# Patient Record
Sex: Male | Born: 1955 | Race: White | Hispanic: No | Marital: Married | Smoking: Never smoker
Health system: Southern US, Community
[De-identification: ages and names within clinical notes are randomized; demographics above are authoritative.]

## PROBLEM LIST (undated history)

## (undated) DIAGNOSIS — G4700 Insomnia, unspecified: Secondary | ICD-10-CM

## (undated) DIAGNOSIS — R002 Palpitations: Secondary | ICD-10-CM

## (undated) DIAGNOSIS — K219 Gastro-esophageal reflux disease without esophagitis: Secondary | ICD-10-CM

## (undated) DIAGNOSIS — J309 Allergic rhinitis, unspecified: Secondary | ICD-10-CM

## (undated) DIAGNOSIS — I498 Other specified cardiac arrhythmias: Secondary | ICD-10-CM

## (undated) DIAGNOSIS — F419 Anxiety disorder, unspecified: Secondary | ICD-10-CM

## (undated) DIAGNOSIS — R079 Chest pain, unspecified: Secondary | ICD-10-CM

## (undated) DIAGNOSIS — F32A Depression, unspecified: Secondary | ICD-10-CM

## (undated) HISTORY — DX: Gastro-esophageal reflux disease without esophagitis: K21.9

## (undated) HISTORY — PX: NASAL SEPTUM SURGERY: SHX37

## (undated) HISTORY — DX: Chest pain, unspecified: R07.9

## (undated) HISTORY — PX: OTHER SURGICAL HISTORY: SHX169

## (undated) HISTORY — DX: Allergic rhinitis, unspecified: J30.9

## (undated) HISTORY — DX: Other specified cardiac arrhythmias: I49.8

## (undated) HISTORY — DX: Depression, unspecified: F32.A

## (undated) HISTORY — DX: Palpitations: R00.2

## (undated) HISTORY — PX: UPPER GASTROINTESTINAL ENDOSCOPY: SHX188

## (undated) HISTORY — DX: Anxiety disorder, unspecified: F41.9

## (undated) HISTORY — DX: Insomnia, unspecified: G47.00

---

## 2004-01-03 ENCOUNTER — Ambulatory Visit: Payer: Self-pay | Admitting: Internal Medicine

## 2004-02-29 ENCOUNTER — Ambulatory Visit: Payer: Self-pay | Admitting: Internal Medicine

## 2004-03-18 ENCOUNTER — Ambulatory Visit: Payer: Self-pay | Admitting: Internal Medicine

## 2004-03-26 ENCOUNTER — Ambulatory Visit: Payer: Self-pay | Admitting: Internal Medicine

## 2004-05-10 ENCOUNTER — Ambulatory Visit: Payer: Self-pay | Admitting: Internal Medicine

## 2004-05-17 ENCOUNTER — Ambulatory Visit: Payer: Self-pay | Admitting: Internal Medicine

## 2004-06-07 ENCOUNTER — Ambulatory Visit: Payer: Self-pay | Admitting: Internal Medicine

## 2004-09-06 ENCOUNTER — Ambulatory Visit: Payer: Self-pay | Admitting: Internal Medicine

## 2004-11-15 ENCOUNTER — Ambulatory Visit: Payer: Self-pay | Admitting: Internal Medicine

## 2004-11-22 ENCOUNTER — Ambulatory Visit: Payer: Self-pay | Admitting: Internal Medicine

## 2005-04-30 ENCOUNTER — Ambulatory Visit: Payer: Self-pay | Admitting: Family Medicine

## 2005-04-30 ENCOUNTER — Encounter: Payer: Self-pay | Admitting: Internal Medicine

## 2005-11-27 ENCOUNTER — Ambulatory Visit: Payer: Self-pay | Admitting: Internal Medicine

## 2005-12-05 ENCOUNTER — Ambulatory Visit: Payer: Self-pay | Admitting: Internal Medicine

## 2005-12-05 LAB — CONVERTED CEMR LAB
AST: 22 units/L (ref 0–37)
Albumin: 3.9 g/dL (ref 3.5–5.2)
BUN: 11 mg/dL (ref 6–23)
CO2: 29 meq/L (ref 19–32)
Calcium: 9.3 mg/dL (ref 8.4–10.5)
Chol/HDL Ratio, serum: 3.7
Cholesterol: 186 mg/dL (ref 0–200)
Creatinine, Ser: 1.1 mg/dL (ref 0.4–1.5)
Eosinophil percent: 3.5 % (ref 0.0–5.0)
HCT: 42.2 % (ref 39.0–52.0)
HDL: 49.9 mg/dL (ref >39.0)
Hemoglobin: 14.1 g/dL (ref 13.0–17.0)
Lymphocytes Relative: 40.6 % (ref 12.0–46.0)
MCHC: 33.4 g/dL (ref 30.0–36.0)
Neutro Abs: 2.5 10*3/uL (ref 1.4–7.7)
Neutrophils Relative %: 46.4 % (ref 43.0–77.0)
Potassium: 3.9 meq/L (ref 3.5–5.1)
RDW: 12.1 % (ref 11.5–14.6)
TSH: 1.59 microintl units/mL (ref 0.35–5.50)
Total Bilirubin: 1 mg/dL (ref 0.3–1.2)
Triglyceride fasting, serum: 62 mg/dL (ref 0–149)

## 2005-12-12 ENCOUNTER — Ambulatory Visit: Payer: Self-pay | Admitting: Internal Medicine

## 2006-01-16 ENCOUNTER — Ambulatory Visit: Payer: Self-pay | Admitting: Family Medicine

## 2006-05-12 ENCOUNTER — Ambulatory Visit: Payer: Self-pay | Admitting: Internal Medicine

## 2006-07-23 ENCOUNTER — Ambulatory Visit: Payer: Self-pay | Admitting: Internal Medicine

## 2006-08-17 ENCOUNTER — Ambulatory Visit (HOSPITAL_COMMUNITY): Admission: RE | Admit: 2006-08-17 | Discharge: 2006-08-17 | Payer: Self-pay | Admitting: Gastroenterology

## 2006-08-28 ENCOUNTER — Ambulatory Visit: Payer: Self-pay | Admitting: Gastroenterology

## 2006-09-25 ENCOUNTER — Ambulatory Visit: Payer: Self-pay | Admitting: Internal Medicine

## 2006-09-25 ENCOUNTER — Telehealth: Payer: Self-pay | Admitting: Internal Medicine

## 2006-09-25 DIAGNOSIS — I498 Other specified cardiac arrhythmias: Secondary | ICD-10-CM

## 2006-09-25 DIAGNOSIS — J309 Allergic rhinitis, unspecified: Secondary | ICD-10-CM

## 2006-09-25 HISTORY — DX: Allergic rhinitis, unspecified: J30.9

## 2006-09-25 HISTORY — DX: Other specified cardiac arrhythmias: I49.8

## 2006-10-27 ENCOUNTER — Ambulatory Visit: Payer: Self-pay | Admitting: Internal Medicine

## 2006-12-08 ENCOUNTER — Ambulatory Visit: Payer: Self-pay | Admitting: Internal Medicine

## 2007-02-08 ENCOUNTER — Ambulatory Visit: Payer: Self-pay | Admitting: Internal Medicine

## 2007-09-17 ENCOUNTER — Ambulatory Visit (HOSPITAL_COMMUNITY): Admission: RE | Admit: 2007-09-17 | Discharge: 2007-09-17 | Payer: Self-pay | Admitting: Chiropractic Medicine

## 2007-10-11 ENCOUNTER — Telehealth: Payer: Self-pay | Admitting: Internal Medicine

## 2007-10-11 ENCOUNTER — Ambulatory Visit: Payer: Self-pay | Admitting: Internal Medicine

## 2007-10-11 LAB — CONVERTED CEMR LAB
AST: 22 units/L (ref 0–37)
Basophils Absolute: 0 10*3/uL (ref 0.0–0.1)
Bilirubin Urine: NEGATIVE
Bilirubin, Direct: 0.1 mg/dL (ref 0.0–0.3)
Blood in Urine, dipstick: NEGATIVE
Chloride: 110 meq/L (ref 96–112)
Cholesterol: 153 mg/dL (ref 0–200)
Creatinine, Ser: 1.1 mg/dL (ref 0.4–1.5)
Eosinophils Absolute: 0.2 10*3/uL (ref 0.0–0.7)
GFR calc non Af Amer: 75 mL/min
Glucose, Urine, Semiquant: NEGATIVE
HDL: 49.1 mg/dL (ref >39.0)
MCHC: 34.3 g/dL (ref 30.0–36.0)
MCV: 91.1 fL (ref 78.0–100.0)
Neutrophils Relative %: 43.7 % (ref 43.0–77.0)
PSA: 0.71 ng/mL (ref 0.10–4.00)
Platelets: 183 10*3/uL (ref 150–400)
Potassium: 4.1 meq/L (ref 3.5–5.1)
Protein, U semiquant: NEGATIVE
RDW: 12.6 % (ref 11.5–14.6)
Sodium: 142 meq/L (ref 135–145)
Total Bilirubin: 1.4 mg/dL — ABNORMAL HIGH (ref 0.3–1.2)
Urobilinogen, UA: 0.2
VLDL: 13 mg/dL (ref 0–40)
pH: 7

## 2007-10-28 ENCOUNTER — Ambulatory Visit: Payer: Self-pay | Admitting: Internal Medicine

## 2007-10-28 DIAGNOSIS — R002 Palpitations: Secondary | ICD-10-CM

## 2007-10-28 DIAGNOSIS — R079 Chest pain, unspecified: Secondary | ICD-10-CM

## 2007-10-28 HISTORY — DX: Chest pain, unspecified: R07.9

## 2007-10-28 HISTORY — DX: Palpitations: R00.2

## 2007-11-26 ENCOUNTER — Ambulatory Visit: Payer: Self-pay

## 2007-12-01 ENCOUNTER — Encounter: Payer: Self-pay | Admitting: Internal Medicine

## 2007-12-13 ENCOUNTER — Ambulatory Visit: Payer: Self-pay | Admitting: Internal Medicine

## 2007-12-13 DIAGNOSIS — J209 Acute bronchitis, unspecified: Secondary | ICD-10-CM

## 2007-12-15 ENCOUNTER — Telehealth: Payer: Self-pay | Admitting: Internal Medicine

## 2008-01-14 ENCOUNTER — Ambulatory Visit: Payer: Self-pay | Admitting: Cardiovascular Disease

## 2008-01-14 ENCOUNTER — Encounter: Payer: Self-pay | Admitting: Cardiovascular Disease

## 2008-01-14 DIAGNOSIS — R578 Other shock: Secondary | ICD-10-CM | POA: Insufficient documentation

## 2008-06-05 ENCOUNTER — Ambulatory Visit: Payer: Self-pay | Admitting: Internal Medicine

## 2008-06-05 DIAGNOSIS — H6980 Other specified disorders of Eustachian tube, unspecified ear: Secondary | ICD-10-CM

## 2008-06-16 ENCOUNTER — Ambulatory Visit: Payer: Self-pay | Admitting: Internal Medicine

## 2008-06-16 DIAGNOSIS — H6590 Unspecified nonsuppurative otitis media, unspecified ear: Secondary | ICD-10-CM | POA: Insufficient documentation

## 2008-07-13 ENCOUNTER — Telehealth: Payer: Self-pay | Admitting: Internal Medicine

## 2008-08-09 ENCOUNTER — Ambulatory Visit: Payer: Self-pay | Admitting: Internal Medicine

## 2008-08-09 DIAGNOSIS — G4700 Insomnia, unspecified: Secondary | ICD-10-CM

## 2008-08-09 HISTORY — DX: Insomnia, unspecified: G47.00

## 2008-09-22 ENCOUNTER — Ambulatory Visit: Payer: Self-pay | Admitting: Gastroenterology

## 2008-11-03 ENCOUNTER — Telehealth: Payer: Self-pay | Admitting: Gastroenterology

## 2008-11-09 ENCOUNTER — Ambulatory Visit: Payer: Self-pay | Admitting: Gastroenterology

## 2008-11-09 HISTORY — PX: COLONOSCOPY: SHX174

## 2008-11-24 ENCOUNTER — Ambulatory Visit: Payer: Self-pay | Admitting: Internal Medicine

## 2009-01-31 ENCOUNTER — Telehealth: Payer: Self-pay | Admitting: Internal Medicine

## 2009-01-31 ENCOUNTER — Telehealth: Payer: Self-pay | Admitting: Gastroenterology

## 2009-05-22 ENCOUNTER — Ambulatory Visit: Payer: Self-pay | Admitting: Internal Medicine

## 2009-05-22 DIAGNOSIS — J0100 Acute maxillary sinusitis, unspecified: Secondary | ICD-10-CM | POA: Insufficient documentation

## 2009-11-19 ENCOUNTER — Ambulatory Visit: Payer: Self-pay | Admitting: Internal Medicine

## 2009-11-19 LAB — CONVERTED CEMR LAB
ALT: 17 units/L (ref 0–53)
AST: 24 units/L (ref 0–37)
Albumin: 4 g/dL (ref 3.5–5.2)
BUN: 18 mg/dL (ref 6–23)
Basophils Relative: 0.7 % (ref 0.0–3.0)
Blood in Urine, dipstick: NEGATIVE
Chloride: 107 meq/L (ref 96–112)
Eosinophils Relative: 4.4 % (ref 0.0–5.0)
GFR calc non Af Amer: 84.31 mL/min (ref >60)
Glucose, Urine, Semiquant: NEGATIVE
HCT: 40 % (ref 39.0–52.0)
HDL: 62.1 mg/dL (ref >39.00)
Hemoglobin: 13.7 g/dL (ref 13.0–17.0)
Lymphs Abs: 2.1 10*3/uL (ref 0.7–4.0)
MCV: 91.1 fL (ref 78.0–100.0)
Monocytes Absolute: 0.5 10*3/uL (ref 0.1–1.0)
Monocytes Relative: 10.4 % (ref 3.0–12.0)
Neutro Abs: 2.1 10*3/uL (ref 1.4–7.7)
Nitrite: NEGATIVE
PSA: 0.98 ng/mL (ref 0.10–4.00)
Potassium: 4.2 meq/L (ref 3.5–5.1)
Protein, U semiquant: NEGATIVE
RBC: 4.39 M/uL (ref 4.22–5.81)
Sodium: 141 meq/L (ref 135–145)
TSH: 1.54 microintl units/mL (ref 0.35–5.50)
Total Bilirubin: 0.8 mg/dL (ref 0.3–1.2)
Total Protein: 6.3 g/dL (ref 6.0–8.3)
VLDL: 11.4 mg/dL (ref 0.0–40.0)
WBC Urine, dipstick: NEGATIVE
WBC: 4.9 10*3/uL (ref 4.5–10.5)
pH: 7

## 2009-11-26 ENCOUNTER — Ambulatory Visit: Payer: Self-pay | Admitting: Internal Medicine

## 2010-02-05 ENCOUNTER — Ambulatory Visit
Admission: RE | Admit: 2010-02-05 | Discharge: 2010-02-05 | Payer: Self-pay | Source: Home / Self Care | Attending: Internal Medicine | Admitting: Internal Medicine

## 2010-02-08 ENCOUNTER — Encounter: Payer: Self-pay | Admitting: Internal Medicine

## 2010-02-08 ENCOUNTER — Telehealth: Payer: Self-pay | Admitting: Internal Medicine

## 2010-03-12 NOTE — Assessment & Plan Note (Signed)
Summary: CPX // RS/pt rescd from bump-ok to use-per dr//ccm/pt rescd//ccm   Vital Signs:  Patient profile:   55 year old male Height:      66 inches Weight:      151 pounds BMI:     24.46 Temp:     98.0 degrees F oral Pulse rate:   72 / minute Resp:     12 per minute BP sitting:   116 / 72  (left arm)  Vitals Entered By: Willy Eddy, LPN (November 26, 2009 3:06 PM) CC: cpx Is Patient Diabetic? No   Primary Care Provider:  Stacie Glaze MD  CC:  cpx.  History of Present Illness: The pt was asked about all immunizations, health maint. services that are appropriate to their age and was given guidance on diet exercize  and weight management  Follow-Up Visit      This is a 55 year old man who presents for Follow-up visit.  The patient denies chest pain, palpitations, dizziness, syncope, low blood sugar symptoms, high blood sugar symptoms, edema, SOB, DOE, PND, and orthopnea.  Since the last visit the patient notes no new problems or concerns.  The patient reports taking meds as prescribed.  When questioned about possible medication side effects, the patient notes none.    Preventive Screening-Counseling & Management  Alcohol-Tobacco     Smoking Status: never     Passive Smoke Exposure: no     Tobacco Counseling: not indicated; no tobacco use  Problems Prior to Update: 1)  Acute Maxillary Sinusitis  (ICD-461.0) 2)  Gastritis  (ICD-535.50) 3)  Insomnia, Chronic  (ICD-307.42) 4)  Nonsuppratv Otitis Media Not Spec As Acut/chron  (ICD-381.4) 5)  Eustachian Tube Dysfunction, Bilateral  (ICD-381.81) 6)  Septic/hypovolemic Shock  (ICD-785.59) 7)  Bronchitis, Acute With Mild Bronchospasm  (ICD-466.0) 8)  Chest Pain Unspecified  (ICD-786.50) 9)  Palpitations, Chronic  (ICD-785.1) 10)  Preventive Health Care  (ICD-V70.0) 11)  Family History of Cad Male 1st Degree Relative <50  (ICD-V17.3) 12)  Bradycardia  (ICD-427.89) 13)  Allergic Rhinitis  (ICD-477.9)  Current Problems  (verified): 1)  Acute Maxillary Sinusitis  (ICD-461.0) 2)  Gastritis  (ICD-535.50) 3)  Insomnia, Chronic  (ICD-307.42) 4)  Nonsuppratv Otitis Media Not Spec As Acut/chron  (ICD-381.4) 5)  Eustachian Tube Dysfunction, Bilateral  (ICD-381.81) 6)  Septic/hypovolemic Shock  (ICD-785.59) 7)  Bronchitis, Acute With Mild Bronchospasm  (ICD-466.0) 8)  Chest Pain Unspecified  (ICD-786.50) 9)  Palpitations, Chronic  (ICD-785.1) 10)  Preventive Health Care  (ICD-V70.0) 11)  Family History of Cad Male 1st Degree Relative <50  (ICD-V17.3) 12)  Bradycardia  (ICD-427.89) 13)  Allergic Rhinitis  (ICD-477.9)  Medications Prior to Update: 1)  Nasonex 50 Mcg/act Susp (Mometasone Furoate) .... Two Stray Each Nostril Daily 2)  Allerx-D 120-2.5 Mg Xr12h-Tab (Pseudoephedrine-Methscopolamin) .... One By Mouth Two Times A Day  Current Medications (verified): 1)  Ecotrin Low Strength 81 Mg Tbec (Aspirin) .... One By Mouth Every Other Day  Allergies (verified): 1)  ! Demerol 2)  ! 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  Past Medical History: Reviewed history from 01/14/2008 and no changes required. Allergic rhinitis hx of palpitations Atypical chest pain  Past Surgical History: Reviewed history from 10/22/2006 and no changes required. Sinus surgery  Family History: Reviewed history from 10/22/2006 and no  changes required. Family History of CAD Male 1st degree relative <50 Family History Hypertension Family History of Anxiety  Social History: Reviewed history from 01/14/2008 and no changes required. 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    Physical Exam  General:  alert and well-developed.   Head:  Normocephalic and atraumatic without obvious abnormalities. No apparent alopecia or balding. Eyes:  pupils equal and pupils round.   Ears:  R ear normal and L ear normal.   Nose:  mucosal edema and airflow obstruction.   Mouth:  posterior lymphoid hypertrophy and postnasal drip.   Neck:  No deformities, masses, or tenderness noted. Lungs:  normal respiratory effort and no wheezes.   Heart:  normal rate and regular rhythm.     Impression & Recommendations:  Problem # 1:  PREVENTIVE HEALTH CARE (ICD-V70.0) The pt was asked about all immunizations, health maint. services that are appropriate to their age and was given guidance on diet exercize  and weight management  Colonoscopy: Location:  [facility].   (11/09/2008) Td Booster: Historical (02/10/2005)   Flu Vax: Fluvax 3+ (11/19/2009)   Chol: 202 (11/19/2009)   HDL: 62.10 (11/19/2009)   LDL: 91 (10/11/2007)   TG: 57.0 (11/19/2009) TSH: 1.54 (11/19/2009)   PSA: 0.98 (11/19/2009) Next Colonoscopy due:: 11/2018  (11/09/2008)  Discussed using sunscreen, use of alcohol, drug use, self testicular exam, routine dental care, routine eye care, routine physical exam, seat belts, multiple vitamins, osteoporosis prevention, adequate calcium intake in diet, and recommendations for immunizations.  Discussed exercise and checking cholesterol.  Discussed gun safety, safe sex, and contraception. Also recommend checking PSA.  Complete Medication List: 1)  Ecotrin Low Strength 81 Mg Tbec (Aspirin) .... One by mouth every other day  Patient Instructions: 1)  Please schedule a follow-up appointment in 12 months.  CPX   Orders Added: 1)  Est. Patient 40-64 years [99396]

## 2010-03-12 NOTE — Assessment & Plan Note (Signed)
Summary: COUGH/NJR   Vital Signs:  Patient profile:   55 year old male Height:      66 inches Weight:      148 pounds BMI:     23.97 Temp:     98.2 degrees F oral Pulse rate:   68 / minute Resp:     12 per minute BP sitting:   110 / 70  (left arm)  Vitals Entered By: Willy Eddy, LPN (May 22, 2009 4:22 PM) CC: c/o post nasal drainage and cough since   saturay-some relief with nasanex, URI symptoms   CC:  c/o post nasal drainage and cough since   saturay-some relief with nasanex and URI symptoms.  History of Present Illness:  URI Symptoms      This is a 55 year old man who presents with URI symptoms.  The patient reports nasal congestion, purulent nasal discharge, productive cough, and earache, but denies clear nasal discharge, sore throat, dry cough, and sick contacts.  Associated symptoms include low-grade fever (<100.5 degrees).  The patient also reports headache and severe fatigue.  Risk factors for Strep sinusitis include unilateral facial pain.    Preventive Screening-Counseling & Management  Alcohol-Tobacco     Smoking Status: never     Passive Smoke Exposure: no  Problems Prior to Update: 1)  Acute Maxillary Sinusitis  (ICD-461.0) 2)  Gastritis  (ICD-535.50) 3)  Insomnia, Chronic  (ICD-307.42) 4)  Nonsuppratv Otitis Media Not Spec As Acut/chron  (ICD-381.4) 5)  Eustachian Tube Dysfunction, Bilateral  (ICD-381.81) 6)  Septic/hypovolemic Shock  (ICD-785.59) 7)  Bronchitis, Acute With Mild Bronchospasm  (ICD-466.0) 8)  Chest Pain Unspecified  (ICD-786.50) 9)  Palpitations, Chronic  (ICD-785.1) 10)  Preventive Health Care  (ICD-V70.0) 11)  Family History of Cad Male 1st Degree Relative <50  (ICD-V17.3) 12)  Bradycardia  (ICD-427.89) 13)  Allergic Rhinitis  (ICD-477.9)  Current Problems (verified): 1)  Gastritis  (ICD-535.50) 2)  Insomnia, Chronic  (ICD-307.42) 3)  Nonsuppratv Otitis Media Not Spec As Acut/chron  (ICD-381.4) 4)  Eustachian Tube Dysfunction,  Bilateral  (ICD-381.81) 5)  Septic/hypovolemic Shock  (ICD-785.59) 6)  Bronchitis, Acute With Mild Bronchospasm  (ICD-466.0) 7)  Chest Pain Unspecified  (ICD-786.50) 8)  Palpitations, Chronic  (ICD-785.1) 9)  Preventive Health Care  (ICD-V70.0) 10)  Family History of Cad Male 1st Degree Relative <50  (ICD-V17.3) 11)  Bradycardia  (ICD-427.89) 12)  Allergic Rhinitis  (ICD-477.9)  Medications Prior to Update: 1)  Bayer Low Strength 81 Mg  Tbec (Aspirin) .... Once Daily 2)  Allegra-D 12 Hour 60-120 Mg Xr12h-Tab (Fexofenadine-Pseudoephedrine) .... One By Mouth Bid 3)  Nasonex 50 Mcg/act Susp (Mometasone Furoate) .... Two Stray Each Nostril Daily 4)  Diazepam 5 Mg Tabs (Diazepam) .... One By Mouth At [facility] 5)  Prilosec 20 Mg Cpdr (Omeprazole) .... One By Mouth Daily 6)  Anusol-Hc 25 Mg Supp (Hydrocortisone Acetate) .Marland Kitchen.. 1 Pr Three Times A Day For Rectal Bleeding  Current Medications (verified): 1)  Nasonex 50 Mcg/act Susp (Mometasone Furoate) .... Two Stray Each Nostril Daily 2)  Clarithromycin 500 Mg Tabs (Clarithromycin) .... One By Mouth Two Times A Day With Food For 10 Days 3)  Allerx-D 120-2.5 Mg Xr12h-Tab (Pseudoephedrine-Methscopolamin) .... One By Mouth Two Times A Day  Allergies (verified): 1)  ! Demerol 2)  ! 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  Past Medical History: Reviewed  history from 01/14/2008 and no changes required. Allergic rhinitis hx of palpitations Atypical chest pain  Past Surgical History: Reviewed history from 10/22/2006 and no changes required. Sinus surgery  Family History: Reviewed history from 10/22/2006 and no changes required. Family History of CAD Male 1st degree relative <50 Family History Hypertension Family History of Anxiety  Social History: Reviewed history from 01/14/2008 and no changes required. 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  The patient denies anorexia, fever, weight loss, weight gain, vision loss, decreased hearing, chest pain, syncope, dyspnea on exertion, peripheral edema, hemoptysis, abdominal pain, melena, hematochezia, severe indigestion/heartburn, hematuria, incontinence, genital sores, muscle weakness, suspicious skin lesions, transient blindness, difficulty walking, depression, unusual weight change, abnormal bleeding, enlarged lymph nodes, angioedema, breast masses, and testicular masses.    Physical Exam  General:  alert and well-developed.   Head:  Normocephalic and atraumatic without obvious abnormalities. No apparent alopecia or balding. Eyes:  pupils equal and pupils round.   Ears:  R ear normal and L ear normal.   Nose:  mucosal edema and airflow obstruction.   Mouth:  posterior lymphoid hypertrophy and postnasal drip.   Neck:  No deformities, masses, or tenderness noted. Lungs:  normal respiratory effort and no wheezes.   Heart:  normal rate and regular rhythm.   Abdomen:  soft, non-tender, and normal bowel sounds.     Impression & Recommendations:  Problem # 1:  BRONCHITIS, ACUTE WITH MILD BRONCHOSPASM (ICD-466.0) Assessment New  The following medications were removed from the medication list:    Allegra-d 12 Hour  60-120 Mg Xr12h-tab (Fexofenadine-pseudoephedrine) ..... One by mouth bid His updated medication list for this problem includes:    Clarithromycin 500 Mg Tabs (Clarithromycin) ..... One by mouth two times a day with food for 10 days    Allerx-d 120-2.5 Mg Xr12h-tab (Pseudoephedrine-methscopolamin) ..... One by mouth two times a day  Take antibiotics and other medications as directed. Encouraged to push clear liquids, get enough rest, and take acetaminophen as needed. To be seen in 5-7 days if no improvement, sooner if worse.  Problem # 2:  ACUTE MAXILLARY SINUSITIS (ICD-461.0) Assessment: New  The following medications were removed from the medication list:    Allegra-d 12 Hour 60-120 Mg Xr12h-tab (Fexofenadine-pseudoephedrine) ..... One by mouth bid His updated medication list for this problem includes:    Nasonex 50 Mcg/act Susp (Mometasone furoate) .Marland Kitchen..Marland Kitchen Two stray each nostril daily    Clarithromycin 500 Mg Tabs (Clarithromycin) ..... One by mouth two times a day with food for 10 days    Allerx-d 120-2.5 Mg Xr12h-tab (Pseudoephedrine-methscopolamin) ..... One by mouth two times a day  Orders: Admin of Therapeutic Inj  intramuscular or subcutaneous ([address]) Depo- Medrol 80mg  (J1040)  Complete Medication List: 1)  Nasonex 50 Mcg/act Susp (Mometasone furoate) .... Two stray each nostril daily 2)  Clarithromycin 500 Mg Tabs (Clarithromycin) .... One by mouth two times a day with food for 10 days 3)  Allerx-d 120-2.5 Mg Xr12h-tab (  Pseudoephedrine-methscopolamin) .... One by mouth two times a day  Patient Instructions: 1)  Take your antibiotic as prescribed until ALL of it is gone, but stop if you develop a rash or swelling and contact our office as soon as possible. Prescriptions: ALLERX-D 120-2.5 MG XR12H-TAB (PSEUDOEPHEDRINE-METHSCOPOLAMIN) ONE by mouth two times a day  #20 x 0   Entered and Authorized by:   Stacie Glaze MD   Signed by:   Stacie Glaze MD on 05/22/2009   Method  used:   Electronically to        [facility] [address] [phone]* (retail)       [address]       [place], [place]  [address]       Ph: [phone] or [phone]       Fax: [phone]   RxID:   [id number] CLARITHROMYCIN 500 MG TABS (CLARITHROMYCIN) ONE by mouth two times a day WITH FOOD FOR 10 DAYS  #20 x 0   Entered and Authorized by:   Stacie Glaze MD   Signed by:   Stacie Glaze MD on 05/22/2009   Method used:   Electronically to        [facility] [address] [phone]* (retail)       [address]       [place], [place]  [address]       Ph: [phone] or [phone]       Fax: [phone]   RxID:   [id number]    Medication Administration  Injection # 1:    Medication: Depo- Medrol 80mg     Diagnosis: ACUTE MAXILLARY SINUSITIS (ICD-461.0)    Route: IM    Site: RUOQ gluteus    Exp Date: 01/10/2012    Lot #: [id number]    Mfr: Pharmacia    Comments: 120 mg/1.5 ml given    Patient tolerated injection without complications    Given by: Willy Eddy, LPN (May 22, 2009 5:27 PM)  Orders Added: 1)  Admin of Therapeutic Inj  intramuscular or subcutaneous [96372] 2)  Depo- Medrol 80mg  [J1040] 3)  Est. Patient Level IV [78242]

## 2010-03-14 NOTE — Assessment & Plan Note (Signed)
Summary: pt not feeling well/cjr   Vital Signs:  Patient profile:   55 year old male Weight:      154 pounds Pulse rate:   60 / minute BP sitting:   112 / 80  (left arm)  Vitals Entered By: Kyung Rudd, CMA (February 05, 2010 10:38 AM) CC: c/o nausea until he eats a full meal   Primary Care Provider:  Stacie Glaze MD  CC:  c/o nausea until he eats a full meal.  History of Present Illness: Patient presents to clinic as a workin for evaluation of abdominal discomfort. Notes 3d h/o intermittent mild abd discomfort, increased gas and mild constipation. Denies emesis, hematemesis, hematochezia or melena. +mild nausea. Notes recent shoulder pain now being evaluated by chiropractor. Because of pain has been taking advil otc. Chart review indicates past episode of gastritis. Pt states resolved with ppi but ultimately stopped the medication.   Current Medications (verified): 1)  None  Allergies (verified): 1)  ! Demerol 2)  ! Demerol  Past History:  Past medical, surgical, family and social histories (including risk factors) reviewed, and no changes noted (except as noted below).  Past Medical History: Reviewed history from 01/14/2008 and no changes required. Allergic rhinitis hx of palpitations Atypical chest pain  Past Surgical History: Reviewed history from 10/22/2006 and no changes required. Sinus surgery  Family History: Reviewed history from 10/22/2006 and no changes required. Family History of CAD Male 1st degree relative <50 Family History Hypertension Family History of Anxiety  Social History: Reviewed history from 01/14/2008 and no changes required. 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 No apparent alopecia or balding. Abdomen:  soft, normal bowel sounds, no distention, no masses, no guarding, no rigidity, no rebound tenderness, no hepatomegaly, and no splenomegaly.  +mild discomfort to palpation in periumbilic area.   Impression & Recommendations:  Problem # 1:  ABDOMINAL PAIN, PERIUMBILIC (ICD-789.05) Assessment New Begin PPI tx. Stop nsaids. Followup if no improvement or worsening.  Complete Medication List: 1)  Omeprazole 40 Mg Cpdr (Omeprazole) .... One by mouth qd Prescriptions: OMEPRAZOLE 40 MG CPDR (OMEPRAZOLE) one by mouth qd  #30 x 1   Entered and Authorized by:   Edwyna Perfect MD   Signed by:   Edwyna Perfect MD on 02/05/2010   Method used:   Print then Give to Patient   RxID:   [id number] OMEPRAZOLE 40 MG CPDR (OMEPRAZOLE) one by mouth qd  #30 x 1   Entered and Authorized by:   Edwyna Perfect MD   Signed by:   Edwyna Perfect MD on 02/05/2010   Method used:   Electronically to        [facility] [address] [phone]* (retail)       [address]       [place], [place]  [address]       Ph: [phone] or [phone]       Fax: [phone]   RxID:   [id number]    Orders Added: 1)  Est. Patient Level IV [29518]

## 2010-03-14 NOTE — Progress Notes (Signed)
  Phone Note Call from Patient   Caller: Pt walked in Call For: Dr. Rodena Medin Summary of Call: Pt walked in office complaining of headache caused by Omeprazole that was given to him by Dr. Rodena Medin a few days ago.  Spoke to Dr. Rodena Medin, and changed to Protonix 40 mg. one by mouth daily #30  and 1 refill.  Called to [facility]) Initial call taken by: Lynann Beaver CMA AAMA,  February 08, 2010 9:43 AM

## 2010-03-14 NOTE — Letter (Signed)
Summary: Triage Note -Reaction to Omeprazole  Triage Note -Reaction to Omeprazole   Imported By: Maryln Gottron 02/18/2010 15:44:02  _____________________________________________________________________  External Attachment:    Type:   Image     Comment:   External Document

## 2010-04-10 ENCOUNTER — Encounter: Payer: Self-pay | Admitting: Family Medicine

## 2010-04-10 ENCOUNTER — Ambulatory Visit (INDEPENDENT_AMBULATORY_CARE_PROVIDER_SITE_OTHER): Payer: Managed Care, Other (non HMO) | Admitting: Family Medicine

## 2010-04-10 VITALS — BP 102/72 | Temp 98.5°F | Ht 65.0 in | Wt 151.0 lb

## 2010-04-10 DIAGNOSIS — J019 Acute sinusitis, unspecified: Secondary | ICD-10-CM

## 2010-04-10 MED ORDER — AMOXICILLIN-POT CLAVULANATE 875-125 MG PO TABS: 1.0000 | ORAL_TABLET | Freq: Two times a day (BID) | ORAL | Status: AC

## 2010-04-10 NOTE — Progress Notes (Signed)
  Subjective:    Patient ID: Jay Combs, male    DOB: 1955-03-06, 55 y.o.   MRN: [id number]  HPI  Patient seen with sinus congestive symptoms and cough past 7 days. Intermittent right earache. Not sleeping well past 4 nights but has taken some Sudafed with Claritin-D. Greenish nasal discharge right naris past couple days. Mild headaches. Right frontal sinus pressure. No fever or chills. No cough at this time.   Review of Systems     Objective:   Physical Exam  patient is alert and nontoxic in appearance. Afebrile Nasal exam reveals normal mucosa.  Eardrums are normal Oropharynx is moist and clear Neck supple no adenopathy Chest  Clear to auscultation Heart regular rhythm and rate no murmur       Assessment & Plan:   acute sinusitis. Probably viral. Recommend observation at this time. Printed prescription for Augmentin to start if he has any persistent or worsening sinus pressure or new symptoms such as fever

## 2010-04-10 NOTE — Patient Instructions (Signed)

## 2010-06-25 NOTE — Assessment & Plan Note (Signed)
[facility]                            CARDIOLOGY OFFICE NOTE   NAME:ROSICErmon, Sagan                        MRN:          [id number]  DATE:01/14/2008                            DOB:          25-Apr-1955    Espiridion is seen today as a new patient per Dr. Lovell Sheehan' request.  He is  actually a friend of mine I know from the neighborhood, [place].  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  He has been under a little bit of stress  lately.  He changed jobs a few years ago.  He used to work from [employer]  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  His day usually  starts very early in the morning at 5, and he does not get home until 9  o'clock at night.   His palpitations sound benign.  He gets occasional flip-flops in his  heart.  On occasion, he can have rapid palpitations.  This is a chronic  problem.  It was slightly worse a few months ago when he appeared to  have some bronchitis and was treated for this.   He also complains of night sweats.  On occasion, he will wake up in the  middle of the night and feel palpitations and a cold sweat.  These will  go away after 5 or 10 minutes.  He also had one episode where he had  some strange tingling in his left hand.   As the symptoms seemed to be improving slightly over the last week or  so, he was referred in October 2009 for a stress Myoview.  He exercised  for 12 minutes and had a normal perfusion study.  Interestingly, his EF  was thought to be 48%, but visually appeared normal.   His review of systems otherwise remarkable for recently treated  bronchitis.   He is allergic to DEMEROL.  He takes multivitamins, omega-3, and an  aspirin a day.   Apparently, Dr. Lovell Sheehan and he had some conversation about a trial of  Xanax.   His family history is noncontributory.   The patient is happily married.  He has 2 sons, Cristal Deer  and Josafat,  one is working on being a Investment banker, operational in [place] and the other at [employer].  He is an avid runner, running 3-4 miles a day, 5 days a  week.  He does drink a glass of red wine every night.  He does not  smoke.  He attends church regularly.   PHYSICAL EXAMINATION:  GENERAL:  Remarkable for a thin, healthy-  appearing male in no distress.  VITAL SIGNS:  Blood pressure is 115/18, pulse 67 and regular,  respiratory rate 14 and afebrile.  Weight 145.  HEENT:  Unremarkable.  NECK:  Carotids normal without bruit.  No lymphadenopathy, thyromegaly,  JVP elevation.  LUNGS:  Clear.  Good diaphragmatic motion.  No wheezing.  HEART:  S1 and S2 with normal heart sounds.  PMI normal.  ABDOMEN:  Benign.  Bowel sounds positive.  No AAA.  No tenderness.  No  bruit.  No hepatosplenomegaly, hepatojugular reflux, or tenderness.  EXTREMITIES:  Distal pulses are intact.  No edema.  NEUROLOGIC:  Nonfocal.  SKIN:  Warm and dry.  MUSCULOSKELETAL:  No muscular weakness.   I reviewed his Myoview study.   His baseline EKG is normal.   IMPRESSION:  1. Benign palpitations.  No need for event monitor at this time likely      related to stress, lifestyle, and anxiety.  Consider followup with      Dr. Lovell Sheehan to consider selective serotonin reuptake inhibitor      therapy rather than Xanax.  2. Chest pain, atypical.  Normal perfusion on Myoview.  Continue to      monitor.  Risk factor modification, baby aspirin a day.  The low      ejection fraction does not bother me.  I do not think he needs a      followup echo as clinically and visually he had a normal ejection      fraction with no perfusion defects.     Noralyn Pick. Eden Emms, MD, [facility]  Electronically Signed    PCN/MedQ  DD: 01/14/2008  DT: 01/14/2008  Job #: [id number]

## 2010-06-27 ENCOUNTER — Telehealth: Payer: Self-pay | Admitting: Internal Medicine

## 2010-06-27 NOTE — Telephone Encounter (Signed)
Pt states he has Dr. Tana Felts number.

## 2010-06-27 NOTE — Telephone Encounter (Signed)
Pt called and is req to get a referral to doctor that spec in knee problems. Pt has discussed this matter with Dr Lovell Sheehan yrs ago, and was referred to spec at [facility], but pt was not satisfied with that Dr.

## 2010-06-27 NOTE — Telephone Encounter (Signed)
Dr Lovell Sheehan sends all knee problems to dr Lequita Halt at [facility]- that is who did surgery on his knee

## 2010-12-09 ENCOUNTER — Ambulatory Visit: Payer: Managed Care, Other (non HMO) | Admitting: Internal Medicine

## 2010-12-09 ENCOUNTER — Ambulatory Visit (INDEPENDENT_AMBULATORY_CARE_PROVIDER_SITE_OTHER): Payer: Managed Care, Other (non HMO) | Admitting: Internal Medicine

## 2010-12-09 DIAGNOSIS — Z23 Encounter for immunization: Secondary | ICD-10-CM

## 2011-01-13 ENCOUNTER — Ambulatory Visit: Payer: Managed Care, Other (non HMO) | Admitting: Internal Medicine

## 2011-01-15 ENCOUNTER — Ambulatory Visit (INDEPENDENT_AMBULATORY_CARE_PROVIDER_SITE_OTHER): Payer: Managed Care, Other (non HMO) | Admitting: Internal Medicine

## 2011-01-15 ENCOUNTER — Encounter: Payer: Self-pay | Admitting: Internal Medicine

## 2011-01-15 VITALS — BP 90/72 | Temp 98.1°F | Wt 151.0 lb

## 2011-01-15 DIAGNOSIS — L259 Unspecified contact dermatitis, unspecified cause: Secondary | ICD-10-CM

## 2011-01-15 DIAGNOSIS — L309 Dermatitis, unspecified: Secondary | ICD-10-CM

## 2011-01-15 MED ORDER — TRIAMCINOLONE ACETONIDE 0.1 % EX CREA: TOPICAL_CREAM | Freq: Two times a day (BID) | CUTANEOUS | Status: DC

## 2011-01-15 NOTE — Progress Notes (Signed)
  Subjective:    Patient ID: Jay Combs, male    DOB: 1955-03-14, 55 y.o.   MRN: [id number]  HPI 55 year old patient with a chief complaint of dry itchy skin involving the lateral aspects of both lower legs. This has been a seasonal winter issue. He has been using no moisturizing creams    Review of Systems  Skin: Positive for rash.       Objective:   Physical Exam  Constitutional: He appears well-developed and well-nourished. No distress.  Skin: Rash noted.       dry flaky rash involving the outer aspects of both lower legs with the view scattered excoriations. There is some patchy hair loss involving the left lateral leg          Assessment & Plan:   Eczema. Will treat with the moisturizing creams. Local skin care discussed he was also given a prescription for triamcinolone cream to use short-term

## 2011-01-15 NOTE — Patient Instructions (Signed)
Liberal use of moisturizing creams as discussed  Call or return to clinic prn if these symptoms worsen or fail to improve as anticipated.

## 2011-01-22 ENCOUNTER — Ambulatory Visit (INDEPENDENT_AMBULATORY_CARE_PROVIDER_SITE_OTHER): Payer: Managed Care, Other (non HMO) | Admitting: Family Medicine

## 2011-01-22 ENCOUNTER — Encounter: Payer: Self-pay | Admitting: Family Medicine

## 2011-01-22 VITALS — BP 100/78 | Temp 98.2°F | Wt 150.0 lb

## 2011-01-22 DIAGNOSIS — R110 Nausea: Secondary | ICD-10-CM

## 2011-01-22 MED ORDER — PANTOPRAZOLE SODIUM 40 MG PO TBEC: 40.0000 mg | DELAYED_RELEASE_TABLET | Freq: Every day | ORAL | Status: DC

## 2011-01-22 NOTE — Patient Instructions (Signed)
Bland diet and be in touch if still having symptoms in 2 weeks.

## 2011-01-22 NOTE — Progress Notes (Signed)
  Subjective:    Patient ID: Jay Combs, male    DOB: [birth year], 55 y.o.   MRN: [id number]  HPI  Patient seen acute visit for recurrent nausea. Similar episodes in the past. Noted after eating lots of food around Thanksgiving. In looking back he had similar episodes in the past around hollidays. He has nausea without vomiting. He denies any appetite changes or weight changes. No abdominal pain. No change of bowel habits. Denies any recent chest pains. Previously received acid suppressor with proton pump inhibitor and symptoms improved. He has not taken any medications this time. Drinks occasional wine. Nonsmoker. No history of peptic ulcer disease.  Denies postnasal drip symptoms. No headache. Does not take any regular prescription medications  Past Medical History  Diagnosis Date  . INSOMNIA, CHRONIC 08/09/2008  . NONSUPPRATV OTITIS MEDIA NOT SPEC AS ACUT/CHRON 06/16/2008  . EUSTACHIAN TUBE DYSFUNCTION, BILATERAL 06/05/2008  . BRADYCARDIA 09/25/2006  . Acute maxillary sinusitis 05/22/2009  . BRONCHITIS, ACUTE WITH MILD BRONCHOSPASM 12/13/2007  . ALLERGIC RHINITIS 09/25/2006  . PALPITATIONS, CHRONIC 10/28/2007  . SEPTIC/HYPOVOLEMIC SHOCK 01/14/2008  . CHEST PAIN UNSPECIFIED 10/28/2007   Past Surgical History  Procedure Date  . Nasal sinus surgery     reports that he has never smoked. He does not have any smokeless tobacco history on file. His alcohol and drug histories not on file. family history includes Anxiety disorder in his other; Heart disease in his other; and Hypertension in his other. Allergies  Allergen Reactions  . Meperidine Hcl     REACTION: decreased BP      Review of Systems  Constitutional: Negative for fever, chills, appetite change and unexpected weight change.  HENT: Negative for congestion.   Respiratory: Negative for cough and shortness of breath.   Cardiovascular: Negative for chest pain, palpitations and leg swelling.  Gastrointestinal: Positive for nausea.  Negative for vomiting, abdominal pain and blood in stool.  Genitourinary: Negative for dysuria.  Neurological: Negative for dizziness.  Hematological: Negative for adenopathy.       Objective:   Physical Exam  Constitutional: He appears well-developed and well-nourished. No distress.  HENT:  Mouth/Throat: Oropharynx is clear and moist.  Neck: Neck supple. No thyromegaly present.  Cardiovascular: Normal rate, regular rhythm and normal heart sounds.   Pulmonary/Chest: Effort normal and breath sounds normal. No respiratory distress. He has no wheezes. He has no rales.  Abdominal: Soft. Bowel sounds are normal. He exhibits no distension and no mass. There is no tenderness. There is no rebound and no guarding.  Musculoskeletal: He exhibits no edema.          Assessment & Plan:  Recurrent nausea. Suspect related to recent gastritis versus GERD. He does not have any pain to suggest anything like gallstones.  Start back protonix 40 mg one daily and touch base 2 weeks if symptoms not fully resolved. Go back to bland diet

## 2011-01-23 ENCOUNTER — Ambulatory Visit: Payer: Managed Care, Other (non HMO) | Admitting: Family Medicine

## 2011-01-30 ENCOUNTER — Encounter: Payer: Self-pay | Admitting: Family Medicine

## 2011-01-30 ENCOUNTER — Ambulatory Visit (INDEPENDENT_AMBULATORY_CARE_PROVIDER_SITE_OTHER): Payer: Managed Care, Other (non HMO) | Admitting: Family Medicine

## 2011-01-30 VITALS — BP 100/78 | Temp 97.8°F | Wt 150.0 lb

## 2011-01-30 DIAGNOSIS — R110 Nausea: Secondary | ICD-10-CM

## 2011-01-30 NOTE — Progress Notes (Signed)
  Subjective:    Patient ID: Jay Combs, male    DOB: [birth year], 55 y.o.   MRN: [id number]  HPI  Patient seen for some persistent nausea. Refer to prior note. History of intermittent nausea previously over several years. Recently exacerbated after increased eating around Thanksgiving. We started back protonix and symptoms are somewhat improved at this time though he had particularly bad episode this morning. He noted that symptoms seem to be worse after prolonged periods of not eating and actually improves somewhat with eating. He denies any associated abdominal pain. No headaches. No vertigo. Denies post nasal drip symptoms. No melena or stool changes. No dietary changes. He is taking protonix consistently on a daily basis. Takes no other regular medications.   Review of Systems  Constitutional: Negative for fever, chills, activity change, appetite change and unexpected weight change.  HENT: Negative for trouble swallowing.   Respiratory: Negative for cough and shortness of breath.   Cardiovascular: Negative for chest pain.  Gastrointestinal: Positive for nausea. Negative for vomiting, abdominal pain, diarrhea and constipation.  Neurological: Negative for dizziness and headaches.  Hematological: Negative for adenopathy.       Objective:   Physical Exam  Constitutional: He is oriented to person, place, and time. He appears well-developed and well-nourished. No distress.  HENT:  Right Ear: External ear normal.  Left Ear: External ear normal.  Mouth/Throat: Oropharynx is clear and moist.  Neck: Neck supple. No thyromegaly present.  Cardiovascular: Normal rate and regular rhythm.   Pulmonary/Chest: Effort normal and breath sounds normal. No respiratory distress. He has no wheezes. He has no rales.  Abdominal: Soft. Bowel sounds are normal. He exhibits no distension. There is no tenderness. There is no rebound and no guarding.  Musculoskeletal: He exhibits no edema.  Lymphadenopathy:      He has no cervical adenopathy.  Neurological: He is alert and oriented to person, place, and time.          Assessment & Plan:  Recurrent nausea. Nonfocal exam.  He has not any postprandial symptoms to suggest symptomatic cholelithiasis. No postnasal drip symptoms. No red flags such as weight loss, change in appetite, vomiting, hematemesis, or any associated abdominal pain. Continue Protonix and consider supplementing with Zantac or Pepcid. Check baseline labs. If all normal and symptoms persist consider GI referral for further evaluation

## 2011-01-31 LAB — BASIC METABOLIC PANEL
BUN: 18 mg/dL (ref 6–23)
CO2: 27 mEq/L (ref 19–32)
Calcium: 9.4 mg/dL (ref 8.4–10.5)
Glucose, Bld: 96 mg/dL (ref 70–99)
Sodium: 140 mEq/L (ref 135–145)

## 2011-01-31 LAB — CBC WITH DIFFERENTIAL/PLATELET
Basophils Absolute: 0 10*3/uL (ref 0.0–0.1)
Eosinophils Absolute: 0.3 10*3/uL (ref 0.0–0.7)
Hemoglobin: 14.5 g/dL (ref 13.0–17.0)
Lymphocytes Relative: 38.9 % (ref 12.0–46.0)
Lymphs Abs: 2.7 10*3/uL (ref 0.7–4.0)
MCHC: 34.1 g/dL (ref 30.0–36.0)
Neutro Abs: 3.3 10*3/uL (ref 1.4–7.7)
Platelets: 212 10*3/uL (ref 150.0–400.0)
RDW: 13 % (ref 11.5–14.6)

## 2011-01-31 LAB — HEPATIC FUNCTION PANEL
Albumin: 4.6 g/dL (ref 3.5–5.2)
Total Bilirubin: 1.1 mg/dL (ref 0.3–1.2)

## 2011-01-31 LAB — TSH: TSH: 1.24 u[IU]/mL (ref 0.35–5.50)

## 2011-01-31 LAB — LIPASE: Lipase: 43 U/L (ref 11.0–59.0)

## 2011-02-03 ENCOUNTER — Telehealth: Payer: Self-pay | Admitting: Internal Medicine

## 2011-02-03 NOTE — Telephone Encounter (Signed)
Pt informed

## 2011-02-03 NOTE — Telephone Encounter (Signed)
Pt requesting results of labs.

## 2011-02-03 NOTE — Telephone Encounter (Signed)
Labs all OK

## 2011-02-03 NOTE — Telephone Encounter (Signed)
A Dr Lovell Sheehan pt who saw Dr Caryl Never on 12/20, had labs drawn, requesting results. Pt best number [phone]

## 2011-02-26 ENCOUNTER — Ambulatory Visit: Payer: Managed Care, Other (non HMO) | Admitting: Family Medicine

## 2011-03-03 ENCOUNTER — Encounter: Payer: Self-pay | Admitting: Family Medicine

## 2011-03-03 ENCOUNTER — Ambulatory Visit (INDEPENDENT_AMBULATORY_CARE_PROVIDER_SITE_OTHER): Payer: Managed Care, Other (non HMO) | Admitting: Family Medicine

## 2011-03-03 VITALS — BP 110/82 | Temp 98.0°F | Wt 154.0 lb

## 2011-03-03 DIAGNOSIS — R229 Localized swelling, mass and lump, unspecified: Secondary | ICD-10-CM

## 2011-03-03 NOTE — Progress Notes (Signed)
  Subjective:    Patient ID: Jay Combs, male    DOB: 23-Aug-1955, 56 y.o.   MRN: [id number]  HPI  Acute visit. One-month history small lump left fore head. This has not grown since he noticed a month ago. No history of trauma. No overlying skin changes. Nontender. Denies any headaches. No adenopathy. No appetite or weight changes.   Review of Systems  Constitutional: Negative for fever and chills.  Respiratory: Negative for cough and shortness of breath.   Neurological: Negative for headaches.  Hematological: Negative for adenopathy.       Objective:   Physical Exam  Constitutional: He appears well-developed and well-nourished.  HENT:       Patient has small mobile well-circumscribed 1/2 cm nodular type lesion left fore head.  No overlying skin changes. Nontender. Nonfluctuant.  Neck: Neck supple.  Cardiovascular: Normal rate and regular rhythm.   Pulmonary/Chest: Effort normal and breath sounds normal. No respiratory distress. He has no wheezes. He has no rales.  Lymphadenopathy:    He has no cervical adenopathy.          Assessment & Plan:  Small nodule left fore head. Suspect benign inclusion cyst. Followup promptly if this is growing in size or any overlying changes such as redness or associated pain

## 2011-03-03 NOTE — Patient Instructions (Signed)
Follow up for any rapid increase in size or any changes such as redness or pain. This likely represents a benign inclusion cyst

## 2011-03-17 ENCOUNTER — Ambulatory Visit (INDEPENDENT_AMBULATORY_CARE_PROVIDER_SITE_OTHER): Payer: Managed Care, Other (non HMO) | Admitting: Family

## 2011-03-17 ENCOUNTER — Encounter: Payer: Self-pay | Admitting: Family

## 2011-03-17 VITALS — BP 110/80 | Temp 98.6°F | Wt 152.0 lb

## 2011-03-17 DIAGNOSIS — F419 Anxiety disorder, unspecified: Secondary | ICD-10-CM

## 2011-03-17 DIAGNOSIS — F411 Generalized anxiety disorder: Secondary | ICD-10-CM

## 2011-03-17 DIAGNOSIS — R002 Palpitations: Secondary | ICD-10-CM

## 2011-03-17 LAB — BASIC METABOLIC PANEL
Calcium: 9.3 mg/dL (ref 8.4–10.5)
GFR: 90.16 mL/min (ref >60.00)
Sodium: 140 mEq/L (ref 135–145)

## 2011-03-17 LAB — CBC WITH DIFFERENTIAL/PLATELET
Basophils Relative: 0.4 % (ref 0.0–3.0)
Eosinophils Absolute: 0.2 10*3/uL (ref 0.0–0.7)
Eosinophils Relative: 2.9 % (ref 0.0–5.0)
Lymphocytes Relative: 35.5 % (ref 12.0–46.0)
Monocytes Relative: 9.3 % (ref 3.0–12.0)
Neutrophils Relative %: 51.9 % (ref 43.0–77.0)
RBC: 4.59 Mil/uL (ref 4.22–5.81)
WBC: 7 10*3/uL (ref 4.5–10.5)

## 2011-03-17 LAB — TSH: TSH: 1.61 u[IU]/mL (ref 0.35–5.50)

## 2011-03-17 NOTE — Patient Instructions (Signed)
Palpitations  A palpitation is the feeling that your heartbeat is irregular or is faster than normal. Although this is frightening, it usually is not serious. Palpitations may be caused by excesses of smoking, caffeine, or alcohol. They are also brought on by stress and anxiety. Sometimes, they are caused by heart disease. Unless otherwise noted, your caregiver did not find any signs of serious illness at this time. HOME CARE INSTRUCTIONS  To help prevent palpitations:  Drink decaffeinated coffee, tea, and soda pop. Avoid chocolate.   If you smoke or drink alcohol, quit or cut down as much as possible.   Reduce your stress or anxiety level. Biofeedback, yoga, or meditation will help you relax. Physical activity such as swimming, jogging, or walking also may be helpful.  SEEK MEDICAL CARE IF:   You continue to have a fast heartbeat.   Your palpitations occur more often.  SEEK IMMEDIATE MEDICAL CARE IF: You develop chest pain, shortness of breath, severe headache, dizziness, or fainting. Document Released: 01/25/2000 Document Revised: 10/09/2010 Document Reviewed: 03/26/2007 Exit[employer] Patient Information 2012 [employer]. 

## 2011-03-17 NOTE — Progress Notes (Signed)
Subjective:    Patient ID: Jay Combs, male    DOB: 1955-07-07, 56 y.o.   MRN: [id number]  HPI [name] male, nonsmoker, patient of Dr. Lovell Sheehan presents today with complaints of palpitations that typically occur at night about once to twice monthly. The episodes last about 10-15 minutes, beginning with night sweats that we come up from his sleep accompanied with palpitations. He has never had the symptoms during the day. He routinely exercises about 5-6 times a week and has never had any chest pain or palpitations during this time. He has a past medical history of palpitations about 18 years ago, had a stress test done that was negative. Patient reports that he has a stressful job. He consumes about 2-3 cups of caffeine daily. Denies any chest pain, shortness of breath, lightheadedness, dizziness, nausea or vomiting. Patient has a past medical history of anxiety. Mother and father are both alive and well. Grandfather deceased cardiovascular disease.   Review of Systems  Constitutional: Negative.   HENT: Negative.   Eyes: Negative.   Respiratory: Negative.   Cardiovascular: Positive for palpitations.  Gastrointestinal: Negative.   Genitourinary: Negative.   Skin: Negative.   Hematological: Negative.   Psychiatric/Behavioral: The patient is nervous/anxious.    Past Medical History  Diagnosis Date  . INSOMNIA, CHRONIC 08/09/2008  . NONSUPPRATV OTITIS MEDIA NOT SPEC AS ACUT/CHRON 06/16/2008  . EUSTACHIAN TUBE DYSFUNCTION, BILATERAL 06/05/2008  . BRADYCARDIA 09/25/2006  . Acute maxillary sinusitis 05/22/2009  . BRONCHITIS, ACUTE WITH MILD BRONCHOSPASM 12/13/2007  . ALLERGIC RHINITIS 09/25/2006  . PALPITATIONS, CHRONIC 10/28/2007  . SEPTIC/HYPOVOLEMIC SHOCK 01/14/2008  . CHEST PAIN UNSPECIFIED 10/28/2007    History   Social History  . Marital Status: Married    Spouse Name: N/A    Number of Children: N/A  . Years of Education: N/A   Occupational History  . Not on file.   Social  History Main Topics  . Smoking status: Never Smoker   . Smokeless tobacco: Not on file  . Alcohol Use: Not on file  . Drug Use: Not on file  . Sexually Active: Not on file   Other Topics Concern  . Not on file   Social History Narrative  . No narrative on file    Past Surgical History  Procedure Date  . Nasal sinus surgery     Family History  Problem Relation Age of Onset  . Heart disease Other   . Hypertension Other   . Anxiety disorder Other     Allergies  Allergen Reactions  . Meperidine Hcl     REACTION: decreased BP    Current Outpatient Prescriptions on File Prior to Visit  Medication Sig Dispense Refill  . pantoprazole (PROTONIX) 40 MG tablet Take 1 tablet (40 mg total) by mouth daily.  30 tablet  5  . triamcinolone cream (KENALOG) 0.1 % Apply topically 2 (two) times daily.  85.2 g  2    BP 110/80  Temp(Src) 98.6 F (37 C) (Oral)  Wt 152 lb (68.947 kg)chart    Objective:   Physical Exam  Constitutional: He is oriented to person, place, and time. He appears well-developed and well-nourished.  HENT:  Right Ear: External ear normal.  Nose: Nose normal.  Mouth/Throat: Oropharynx is clear and moist.  Neck: Normal range of motion. Neck supple.  Cardiovascular: Normal rate, regular rhythm and normal heart sounds.   Pulmonary/Chest: Effort normal and breath sounds normal.  Abdominal: Soft. Bowel sounds are normal.  Musculoskeletal: Normal range of motion.  Neurological: He is alert and oriented to person, place, and time.  Skin: Skin is warm and dry.  Psychiatric: He has a normal mood and affect.          Assessment & Plan:  Assessment: Palpitations  Plan: EKG within normal limits as compared to previous EKGs. Labs sent to include BMP, CBC, TSH outpatient and the results. We'll consider a referral to cardiology to repeat the stress test that he had had done 18 years ago. Patient to call the office if his symptoms worsen or persist, recheck as  scheduled and when necessary.

## 2011-03-18 ENCOUNTER — Other Ambulatory Visit: Payer: Self-pay | Admitting: Family

## 2011-03-18 DIAGNOSIS — R002 Palpitations: Secondary | ICD-10-CM

## 2011-03-19 ENCOUNTER — Encounter: Payer: Self-pay | Admitting: Internal Medicine

## 2011-03-19 ENCOUNTER — Ambulatory Visit (INDEPENDENT_AMBULATORY_CARE_PROVIDER_SITE_OTHER): Payer: Managed Care, Other (non HMO) | Admitting: Internal Medicine

## 2011-03-19 VITALS — BP 100/60 | HR 60 | Temp 98.3°F | Resp 14 | Ht 65.0 in | Wt 152.0 lb

## 2011-03-19 DIAGNOSIS — F411 Generalized anxiety disorder: Secondary | ICD-10-CM

## 2011-03-19 DIAGNOSIS — F419 Anxiety disorder, unspecified: Secondary | ICD-10-CM

## 2011-03-19 DIAGNOSIS — R002 Palpitations: Secondary | ICD-10-CM

## 2011-03-19 MED ORDER — ALPRAZOLAM 0.25 MG PO TABS: 0.2500 mg | ORAL_TABLET | Freq: Two times a day (BID) | ORAL | Status: DC | PRN

## 2011-03-19 NOTE — Progress Notes (Signed)
Subjective:    Patient ID: Jay Combs, male    DOB: 11-19-1955, 56 y.o.   MRN: [id number]  HPI The pt has been waking up with episodes of cold sweats and palpitations that last for 30 min or so and then he is able to go back to sleep. He does not get up, eat or drink at one of these episodes. Exercises on treadmill without symptoms. He believes that the HR in exercise is as high as 150 range. Hx of bradycardia. Basic chemistries including a CBC and a metabolic panel were normal no abnormalities of potassium and sodium were seen.  EKG showed sinus rhythm approximately 60 no suggestion of ischemic or arrhythmia seen.   Review of Systems  Constitutional: Negative for fever and fatigue.  HENT: Negative for hearing loss, congestion, neck pain and postnasal drip.   Eyes: Negative for discharge, redness and visual disturbance.  Respiratory: Negative for cough, shortness of breath and wheezing.   Cardiovascular: Negative for leg swelling.  Gastrointestinal: Negative for abdominal pain, constipation and abdominal distention.  Genitourinary: Negative for urgency and frequency.  Musculoskeletal: Negative for joint swelling and arthralgias.  Skin: Negative for color change and rash.  Neurological: Negative for weakness and light-headedness.  Hematological: Negative for adenopathy.  Psychiatric/Behavioral: Negative for behavioral problems.   Past Medical History  Diagnosis Date  . INSOMNIA, CHRONIC 08/09/2008  . NONSUPPRATV OTITIS MEDIA NOT SPEC AS ACUT/CHRON 06/16/2008  . EUSTACHIAN TUBE DYSFUNCTION, BILATERAL 06/05/2008  . BRADYCARDIA 09/25/2006  . Acute maxillary sinusitis 05/22/2009  . BRONCHITIS, ACUTE WITH MILD BRONCHOSPASM 12/13/2007  . ALLERGIC RHINITIS 09/25/2006  . PALPITATIONS, CHRONIC 10/28/2007  . SEPTIC/HYPOVOLEMIC SHOCK 01/14/2008  . CHEST PAIN UNSPECIFIED 10/28/2007    History   Social History  . Marital Status: Married    Spouse Name: N/A    Number of Children: N/A  .  Years of Education: N/A   Occupational History  . Not on file.   Social History Main Topics  . Smoking status: Never Smoker   . Smokeless tobacco: Not on file  . Alcohol Use: Not on file  . Drug Use: Not on file  . Sexually Active: Not on file   Other Topics Concern  . Not on file   Social History Narrative  . No narrative on file    Past Surgical History  Procedure Date  . Nasal sinus surgery     Family History  Problem Relation Age of Onset  . Heart disease Other   . Hypertension Other   . Anxiety disorder Other     Allergies  Allergen Reactions  . Meperidine Hcl     REACTION: decreased BP    No current outpatient prescriptions on file prior to visit.    BP 100/60  Pulse 60  Temp 98.3 F (36.8 C)  Resp 14  Ht 5\' 5"  (1.651 m)  Wt 152 lb (68.947 kg)  BMI 25.29 kg/m2       Objective:   Physical Exam  Nursing note and vitals reviewed. Constitutional: He appears well-developed and well-nourished.  HENT:  Head: Normocephalic and atraumatic.  Eyes: Conjunctivae are normal. Pupils are equal, round, and reactive to light.  Neck: Normal range of motion. Neck supple.  Cardiovascular: Normal rate and regular rhythm.   Pulmonary/Chest: Effort normal and breath sounds normal.  Abdominal: Soft. Bowel sounds are normal.          Assessment & Plan:  Back to [place] to the similar episode to this back in 2009 he  was referred at that time cardiology did evaluate him with a Cardiolite and pelvic stress and anxiety was playing a major role in this.  He does have increased stresses in his life with a job change responsible to change.  We discussed a echocardiogram to look at structural heart temperature was normal if the heart is normal structurally than since he is able to do significant exercise induced heart rate of 250 without symptoms I believe that the focus should be on treating the anxiety.  Given a prescription for Xanax for the next time when these  episodes have taken Xanax almost immediately into the symptoms extinguish quickly if they do anxiety his most probably part of this picture.  These episodes are very intermittent he may go months without one of these episodes

## 2011-03-19 NOTE — Patient Instructions (Signed)
The patient is instructed to continue all medications as prescribed. Schedule followup with check out clerk upon leaving the clinic  

## 2011-03-21 ENCOUNTER — Ambulatory Visit (HOSPITAL_COMMUNITY): Payer: Managed Care, Other (non HMO) | Attending: Cardiology | Admitting: Radiology

## 2011-03-21 DIAGNOSIS — R002 Palpitations: Secondary | ICD-10-CM | POA: Insufficient documentation

## 2011-03-21 DIAGNOSIS — I495 Sick sinus syndrome: Secondary | ICD-10-CM

## 2011-03-21 DIAGNOSIS — I498 Other specified cardiac arrhythmias: Secondary | ICD-10-CM | POA: Insufficient documentation

## 2011-03-24 ENCOUNTER — Other Ambulatory Visit: Payer: Self-pay | Admitting: Internal Medicine

## 2011-03-24 DIAGNOSIS — R002 Palpitations: Secondary | ICD-10-CM

## 2011-03-24 DIAGNOSIS — I5189 Other ill-defined heart diseases: Secondary | ICD-10-CM

## 2011-03-24 NOTE — Progress Notes (Signed)
Pt informed and order sent to [facility]ri 

## 2011-03-26 ENCOUNTER — Ambulatory Visit (INDEPENDENT_AMBULATORY_CARE_PROVIDER_SITE_OTHER): Payer: Managed Care, Other (non HMO) | Admitting: Cardiovascular Disease

## 2011-03-26 ENCOUNTER — Encounter: Payer: Self-pay | Admitting: Cardiovascular Disease

## 2011-03-26 DIAGNOSIS — R079 Chest pain, unspecified: Secondary | ICD-10-CM

## 2011-03-26 DIAGNOSIS — I5032 Chronic diastolic (congestive) heart failure: Secondary | ICD-10-CM | POA: Insufficient documentation

## 2011-03-26 DIAGNOSIS — I509 Heart failure, unspecified: Secondary | ICD-10-CM

## 2011-03-26 DIAGNOSIS — R002 Palpitations: Secondary | ICD-10-CM

## 2011-03-26 NOTE — Assessment & Plan Note (Signed)
Benign and chronic no need for Rx  Has responed to xanax in past.

## 2011-03-26 NOTE — Assessment & Plan Note (Signed)
Resolved 2009 no evidence of CAD

## 2011-03-26 NOTE — Progress Notes (Signed)
[name] is a 56 year old patient referred by Dr. Lovell Sheehan for abnormal diastolic filling pattern on echo.  Marland KitchenHe is actually a friend of mine that I know through the association at [facility].he has a long-standing history of benign sounding palpitations. Apparently he had a monitor about 5 years ago which was benign. He also has atypical chest pain.Jay Combs  has always been a type A personality.He runs 5 days a week Myovue was done in 2009 The study showed normal perfusion and was able to exercise for about 12 minutes. The ejection fraction is calculated at 48% but visually appeared normal.  He currently is essentially asymptomatic except for occasional palpitations.  No HTN and BP usually runs quite low.  Echo done 2/8 reviewed  - Left ventricle: The cavity size was normal. Wall thickness was normal. Systolic function was normal. The estimated ejection fraction was in the range of 60% to 65%. Wall motion was normal; there were no regional wall motion abnormalities. Features are consistent with a pseudonormal left ventricular filling pattern, with concomitant abnormal relaxation and increased filling pressure (grade 2 diastolic dysfunction). - Aortic valve: There was no stenosis. - Mitral valve: Trivial regurgitation. - Right ventricle: The cavity size was normal. Systolic function was normal. - Atrial septum: No defect or patent foramen ovale was identified. - Pulmonary arteries: PA peak pressure: 20mm Hg (S). - Inferior vena cava: The vessel was normal in size; the respirophasic diameter changes were in the normal range (= 50%); findings are consistent with normal central venous pressure. Impressions:  - Normal study except for grade II diastolic dysfunction. As the rest of the study looks normal, I am unsure of the significance of this.  ROS: Denies fever, malais, weight loss, blurry vision, decreased visual acuity, cough, sputum, SOB, hemoptysis, pleuritic pain, palpitaitons,  heartburn, abdominal pain, melena, lower extremity edema, claudication, or rash.  All other systems reviewed and negative   General: Affect appropriate Healthy:  appears stated age HEENT: normal Neck supple with no adenopathy JVP normal no bruits no thyromegaly Lungs clear with no wheezing and good diaphragmatic motion Heart:  S1/S2 no murmur,rub, gallop or click PMI normal Abdomen: benighn, BS positve, no tenderness, no AAA no bruit.  No HSM or HJR Distal pulses intact with no bruits No edema Neuro non-focal Skin warm and dry No muscular weakness  Medications Current Outpatient Prescriptions  Medication Sig Dispense Refill  . cyanocobalamin 500 MCG tablet Take 500 mcg by mouth daily.      . fish oil-omega-3 fatty acids 1000 MG capsule Take 2 g by mouth daily.      Marland Kitchen triamcinolone cream (KENALOG) 0.1 % prn        Allergies Meperidine hcl  Family History: Family History  Problem Relation Age of Onset  . Heart disease Other   . Hypertension Other   . Anxiety disorder Other     Social History: History   Social History  . Marital Status: Married    Spouse Name: N/A    Number of Children: N/A  . Years of Education: N/A   Occupational History  . Not on file.   Social History Main Topics  . Smoking status: Never Smoker   . Smokeless tobacco: Not on file  . Alcohol Use: Not on file  . Drug Use: Not on file  . Sexually Active: Not on file   Other Topics Concern  . Not on file   Social History Narrative  . No narrative on file    Electrocardiogram:  03/16/61  NSR rate 62 normal ECG no LVH  Assessment and Plan

## 2011-03-26 NOTE — Patient Instructions (Signed)
Your physician recommends that you schedule a follow-up appointment in: AS NEEDED  Your physician recommends that you continue on your current medications as directed. Please refer to the Current Medication list given to you today.  

## 2011-03-26 NOTE — Assessment & Plan Note (Signed)
I dont think mitral inflow pattern is significant.  No evidence of structural heart disease.  Fit and no functional limitations.  Normal ECG and no LVH.  Consider F/U doppler with valsalva and strain imaging and pulmonary vein doppler  if patient ever became symptomatic.  No further w/u needed at this time

## 2011-04-02 ENCOUNTER — Ambulatory Visit: Payer: Managed Care, Other (non HMO) | Admitting: Cardiovascular Disease

## 2011-06-25 ENCOUNTER — Telehealth: Payer: Self-pay | Admitting: *Deleted

## 2011-06-25 NOTE — Telephone Encounter (Signed)
Pt went to an Urgent Care this weekend with acute sinusitis and bronchitis and was given Zithromax 500 mg tid, Depo Steroid injection and is asking if we think the Zithromax is enough for the symptoms he has.  Has advised this should be fine unless he developes more symptoms after finishing the Zithromax for at least several days.

## 2012-02-16 ENCOUNTER — Ambulatory Visit (INDEPENDENT_AMBULATORY_CARE_PROVIDER_SITE_OTHER): Payer: Managed Care, Other (non HMO) | Admitting: Family Medicine

## 2012-02-16 ENCOUNTER — Ambulatory Visit: Payer: Managed Care, Other (non HMO) | Admitting: Internal Medicine

## 2012-02-16 ENCOUNTER — Encounter: Payer: Self-pay | Admitting: Family Medicine

## 2012-02-16 VITALS — BP 110/88 | Temp 98.6°F | Wt 150.0 lb

## 2012-02-16 DIAGNOSIS — R110 Nausea: Secondary | ICD-10-CM

## 2012-02-16 DIAGNOSIS — K219 Gastro-esophageal reflux disease without esophagitis: Secondary | ICD-10-CM

## 2012-02-16 MED ORDER — PANTOPRAZOLE SODIUM 40 MG PO TBEC: 40.0000 mg | DELAYED_RELEASE_TABLET | Freq: Every day | ORAL | Status: DC

## 2012-02-16 NOTE — Progress Notes (Signed)
  Subjective:    Patient ID: Jay Combs, male    DOB: 28-Feb-1955, 57 y.o.   MRN: [id number]  HPI Nausea for one week. Patient's had at least 2-3 similar episodes in recent years. He denies any vomiting. He states he generally consumes a very bland diet but because of some travels and holidays has had more rich foods over past few weeks. He thinks it may be GERD related. Previously took  Protonix and symptoms improved rapidly. He's not any appetite or weight changes. No stool changes. No abdominal pain. Frequent belching. No headaches. No vertigo. No regular prescription medications. No food allergies. Symptoms are sometimes positional and worse with lying flat. Frequently his nausea is relieved with eating.   Review of Systems  Constitutional: Negative for fever, chills, appetite change and unexpected weight change.  HENT: Negative for trouble swallowing.   Eyes: Negative for visual disturbance.  Respiratory: Negative for shortness of breath.   Cardiovascular: Negative for chest pain.  Gastrointestinal: Positive for nausea. Negative for vomiting, abdominal pain, diarrhea and constipation.  Genitourinary: Negative for dysuria.  Neurological: Negative for dizziness and headaches.       Objective:   Physical Exam  Constitutional: He appears well-developed and well-nourished. No distress.  Cardiovascular: Normal rate and regular rhythm.   Pulmonary/Chest: Effort normal and breath sounds normal. No respiratory distress. He has no wheezes. He has no rales.  Abdominal: Soft. Bowel sounds are normal. He exhibits no distension and no mass. There is no tenderness. There is no rebound and no guarding.          Assessment & Plan:  Nausea without vomiting. Suspect GERD related. No red flags such as appetite or weight changes. Symptoms have previously improved with Protonix. Refill protonix 40 mg once daily and try tapering off after one month. Information given on GERD prevention

## 2012-02-16 NOTE — Patient Instructions (Addendum)

## 2012-03-15 ENCOUNTER — Ambulatory Visit: Payer: Managed Care, Other (non HMO) | Admitting: Family Medicine

## 2012-03-16 DIAGNOSIS — Z0279 Encounter for issue of other medical certificate: Secondary | ICD-10-CM

## 2012-04-02 ENCOUNTER — Ambulatory Visit: Payer: Managed Care, Other (non HMO) | Admitting: Family Medicine

## 2012-04-05 ENCOUNTER — Ambulatory Visit: Payer: Managed Care, Other (non HMO) | Admitting: Gastroenterology

## 2012-04-12 ENCOUNTER — Other Ambulatory Visit: Payer: Self-pay | Admitting: *Deleted

## 2012-04-12 DIAGNOSIS — R110 Nausea: Secondary | ICD-10-CM

## 2012-04-12 MED ORDER — PANTOPRAZOLE SODIUM 40 MG PO TBEC: 40.0000 mg | DELAYED_RELEASE_TABLET | Freq: Every day | ORAL | Status: DC

## 2012-04-19 ENCOUNTER — Encounter: Payer: Self-pay | Admitting: Gastroenterology

## 2012-04-19 ENCOUNTER — Ambulatory Visit (INDEPENDENT_AMBULATORY_CARE_PROVIDER_SITE_OTHER): Payer: Managed Care, Other (non HMO) | Admitting: Gastroenterology

## 2012-04-19 VITALS — BP 94/64 | HR 68 | Ht 65.0 in | Wt 146.4 lb

## 2012-04-19 DIAGNOSIS — R110 Nausea: Secondary | ICD-10-CM

## 2012-04-19 DIAGNOSIS — K219 Gastro-esophageal reflux disease without esophagitis: Secondary | ICD-10-CM

## 2012-04-19 MED ORDER — PANTOPRAZOLE SODIUM 40 MG PO TBEC: 40.0000 mg | DELAYED_RELEASE_TABLET | Freq: Two times a day (BID) | ORAL | Status: DC

## 2012-04-19 NOTE — Progress Notes (Signed)
History of Present Illness: This is a 57 year old male who relates frequent belching and nausea that began in January. Symptoms were moderately severe and persistent. They have improved on pantoprazole 40 mg daily. He's recently increased pantoprazole to 40 mg twice daily and his symptoms have further improved. He notes similar symptoms, but not as severe, occurring after the Christmas holiday season for 2 or 3 prior years. Denies weight loss, abdominal pain, constipation, diarrhea, change in stool caliber, melena, hematochezia, vomiting, dysphagia, chest pain.  Current Medications, Allergies, Past Medical History, Past Surgical History, Family History and Social History were reviewed in [employer] record.  Physical Exam: General: Well developed , well nourished, no acute distress Head: Normocephalic and atraumatic Eyes:  sclerae anicteric, EOMI Ears: Normal auditory acuity Mouth: No deformity or lesions Lungs: Clear throughout to auscultation Heart: Regular rate and rhythm; no murmurs, rubs or bruits Abdomen: Soft, non tender and non distended. No masses, hepatosplenomegaly or hernias noted. Normal Bowel sounds Musculoskeletal: Symmetrical with no gross deformities  Pulses:  Normal pulses noted Extremities: No clubbing, cyanosis, edema or deformities noted Neurological: Alert oriented x 4, grossly nonfocal Psychological:  Alert and cooperative. Normal mood and affect  Assessment and Recommendations:  1. Suspected GERD, rule out gastritis, ulcer disease and less likely upper gastrointestinal tract neoplasms. Continue pantoprazole 40 mg twice daily taken before breakfast and dinner. Standard antireflux measures. Schedule upper endoscopy. The risks, benefits, and alternatives to endoscopy with possible biopsy and possible dilation were discussed with the patient and they consent to proceed.   2. Colorectal cancer screening, average risk. Prior colonoscopy in September  2010 showing internal hemorrhoids. Routine screening interval of 10 years with a colonoscopy in September 2020.

## 2012-04-19 NOTE — Patient Instructions (Addendum)
You have been scheduled for an endoscopy with propofol. Please follow written instructions given to you at your visit today. If you use inhalers (even only as needed), please bring them with you on the day of your procedure.  Continue Protonix one tablet by mouth twice daily.  Thank you for choosing me and  Gastroenterology.  Venita Lick. Pleas Koch., MD., [facility]

## 2012-04-21 ENCOUNTER — Encounter: Payer: Self-pay | Admitting: Gastroenterology

## 2012-04-26 ENCOUNTER — Telehealth: Payer: Self-pay | Admitting: Gastroenterology

## 2012-04-26 NOTE — Telephone Encounter (Signed)
Left message for patient to call back  

## 2012-04-26 NOTE — Telephone Encounter (Signed)
Patient has a temporary veneer on his front tooth.  He is advised that there could possibly be some damage to a temporary.  He will have a permanent one put on in 2 weeks.  He wants to call back to reschedule when he can get to his calendar for 2 weeks after his permanent has been placed.  He knows to call back since he dose not have his calendar with him.

## 2012-05-04 ENCOUNTER — Encounter: Payer: Managed Care, Other (non HMO) | Admitting: Gastroenterology

## 2012-05-20 ENCOUNTER — Ambulatory Visit (AMBULATORY_SURGERY_CENTER): Payer: Managed Care, Other (non HMO) | Admitting: Gastroenterology

## 2012-05-20 ENCOUNTER — Encounter: Payer: Self-pay | Admitting: Gastroenterology

## 2012-05-20 VITALS — BP 107/67 | HR 53 | Temp 97.3°F | Resp 14 | Ht 65.0 in | Wt 146.0 lb

## 2012-05-20 DIAGNOSIS — R110 Nausea: Secondary | ICD-10-CM

## 2012-05-20 DIAGNOSIS — K219 Gastro-esophageal reflux disease without esophagitis: Secondary | ICD-10-CM

## 2012-05-20 MED ORDER — SODIUM CHLORIDE 0.9 % IV SOLN: 500.0000 mL | INTRAVENOUS | Status: DC

## 2012-05-20 NOTE — Op Note (Signed)
[facility] [address] [place], [address]   ENDOSCOPY PROCEDURE REPORT  PATIENT: Jay Combs, Jay Combs  MR#: [id number] BIRTHDATE: 10-07-1955 , 56  yrs. old GENDER: Male ENDOSCOPIST: Meryl Dare, MD, [facility] PROCEDURE DATE:  05/20/2012 PROCEDURE:  EGD, diagnostic ASA CLASS:     Class II INDICATIONS:  History of esophageal reflux. MEDICATIONS: MAC sedation, administered by CRNA and propofol (Diprivan) 140mg  IV TOPICAL ANESTHETIC: Cetacaine Spray DESCRIPTION OF PROCEDURE: After the risks benefits and alternatives of the procedure were thoroughly explained, informed consent was obtained.  The LB GIF-H180 [id number] endoscope was introduced through the mouth and advanced to the second portion of the duodenum without limitations.  The instrument was slowly withdrawn as the mucosa was fully examined.  ESOPHAGUS: The mucosa of the esophagus appeared normal. STOMACH: The mucosa and folds of the stomach appeared normal. DUODENUM: The duodenal mucosa showed no abnormalities in the bulb and second portion of the duodenum.  Retroflexed views revealed no abnormalities.  The scope was then withdrawn from the patient and the procedure completed.  COMPLICATIONS: There were no complications.  ENDOSCOPIC IMPRESSION: 1.   The EGD appeared normal  RECOMMENDATIONS: 1.  Anti-reflux regimen 2.  Continue PPI    eSigned:  Meryl Dare, MD, [facility] 05/20/2012 2:09 PM

## 2012-05-20 NOTE — Progress Notes (Signed)
Patient did not experience any of the following events: a burn prior to discharge; a fall within the facility; wrong site/side/patient/procedure/implant event; or a hospital transfer or hospital admission upon discharge from the facility. (G8907) Patient did not have preoperative order for IV antibiotic SSI prophylaxis. (G8918)  

## 2012-05-20 NOTE — Patient Instructions (Addendum)
Discharge instructions given with verbal understanding. Normal exam. Resume previous medications. YOU HAD AN ENDOSCOPIC PROCEDURE TODAY AT THE [facility]: Refer to the procedure report that was given to you for any specific questions about what was found during the examination.  If the procedure report does not answer your questions, please call your gastroenterologist to clarify.  If you requested that your care partner not be given the details of your procedure findings, then the procedure report has been included in a sealed envelope for you to review at your convenience later.  YOU SHOULD EXPECT: Some feelings of bloating in the abdomen. Passage of more gas than usual.  Walking can help get rid of the air that was put into your GI tract during the procedure and reduce the bloating. If you had a lower endoscopy (such as a colonoscopy or flexible sigmoidoscopy) you may notice spotting of blood in your stool or on the toilet paper. If you underwent a bowel prep for your procedure, then you may not have a normal bowel movement for a few days.  DIET: Your first meal following the procedure should be a light meal and then it is ok to progress to your normal diet.  A half-sandwich or bowl of soup is an example of a good first meal.  Heavy or fried foods are harder to digest and may make you feel nauseous or bloated.  Likewise meals heavy in dairy and vegetables can cause extra gas to form and this can also increase the bloating.  Drink plenty of fluids but you should avoid alcoholic beverages for 24 hours.  ACTIVITY: Your care partner should take you home directly after the procedure.  You should plan to take it easy, moving slowly for the rest of the day.  You can resume normal activity the day after the procedure however you should NOT DRIVE or use heavy machinery for 24 hours (because of the sedation medicines used during the test).    SYMPTOMS TO REPORT IMMEDIATELY: A gastroenterologist  can be reached at any hour.  During normal business hours, 8:30 AM to 5:00 PM Monday through Friday, call [phone].  After hours and on weekends, please call the GI answering service at [phone] who will take a message and have the physician on call contact you.   Following upper endoscopy (EGD)  Vomiting of blood or coffee ground material  New chest pain or pain under the shoulder blades  Painful or persistently difficult swallowing  New shortness of breath  Fever of 100F or higher  Black, tarry-looking stools  FOLLOW UP: If any biopsies were taken you will be contacted by phone or by letter within the next 1-3 weeks.  Call your gastroenterologist if you have not heard about the biopsies in 3 weeks.  Our staff will call the home number listed on your records the next business day following your procedure to check on you and address any questions or concerns that you may have at that time regarding the information given to you following your procedure. This is a courtesy call and so if there is no answer at the home number and we have not heard from you through the emergency physician on call, we will assume that you have returned to your regular daily activities without incident.  SIGNATURES/CONFIDENTIALITY: You and/or your care partner have signed paperwork which will be entered into your electronic medical record.  These signatures attest to the fact that that the information above on your After   Visit Summary has been reviewed and is understood.  Full responsibility of the confidentiality of this discharge information lies with you and/or your care-partner. 

## 2012-05-21 ENCOUNTER — Telehealth: Payer: Self-pay | Admitting: *Deleted

## 2012-05-21 NOTE — Telephone Encounter (Signed)
  Follow up Call-  Call back number 05/20/2012  Post procedure Call Back phone  # [phone]  Permission to leave phone message Yes     Patient questions:  Do you have a fever, pain , or abdominal swelling? no Pain Score  0 *  Have you tolerated food without any problems? yes  Have you been able to return to your normal activities? yes  Do you have any questions about your discharge instructions: Diet   no Medications  no Follow up visit  no  Do you have questions or concerns about your Care? no  Actions: * If pain score is 4 or above: No action needed, pain <4.

## 2012-05-31 ENCOUNTER — Telehealth: Payer: Self-pay | Admitting: Gastroenterology

## 2012-05-31 DIAGNOSIS — R110 Nausea: Secondary | ICD-10-CM

## 2012-05-31 MED ORDER — PANTOPRAZOLE SODIUM 40 MG PO TBEC: 40.0000 mg | DELAYED_RELEASE_TABLET | Freq: Two times a day (BID) | ORAL | Status: DC

## 2012-05-31 NOTE — Telephone Encounter (Signed)
Prescription sent to patient's pharmacy for a 90 day supply. 

## 2012-06-28 ENCOUNTER — Telehealth: Payer: Self-pay | Admitting: Gastroenterology

## 2012-06-28 MED ORDER — DEXLANSOPRAZOLE 60 MG PO CPDR: 60.0000 mg | DELAYED_RELEASE_CAPSULE | Freq: Every day | ORAL | Status: DC

## 2012-06-28 NOTE — Telephone Encounter (Signed)
Dexilant 60 mg po qam, 11 months of refills Can take TUMS prn for breakthrough symptoms

## 2012-06-28 NOTE — Telephone Encounter (Signed)
Spoke with pt and he states he has been taking protonix but over the weekend he experienced nausea and belching. He has been traveling for 4 weeks and states it could be related to what he was eating. Pt states a friend of his it taking Dexilant and he wonders if that might be something that may work better for him. Please advise.

## 2012-06-28 NOTE — Telephone Encounter (Signed)
Spoke with pt and he is aware, script sent to pharmacy. 

## 2012-09-06 ENCOUNTER — Telehealth: Payer: Self-pay | Admitting: Internal Medicine

## 2012-09-06 NOTE — Telephone Encounter (Signed)
I think this should go to Dr. Caryl Never

## 2012-09-06 NOTE — Telephone Encounter (Signed)
Pt does not want to wait for cpx with Dr Lovell Sheehan. Pt would like a cpx sch with Dr Caryl Never. Can I sch?

## 2012-09-06 NOTE — Telephone Encounter (Signed)
Fine to schedule 

## 2012-09-06 NOTE — Telephone Encounter (Signed)
OK with me.

## 2012-09-06 NOTE — Telephone Encounter (Signed)
Per dr Lovell Sheehan- that would fine

## 2012-09-06 NOTE — Telephone Encounter (Signed)
lmom for pt to sch °

## 2012-09-23 ENCOUNTER — Other Ambulatory Visit: Payer: Managed Care, Other (non HMO)

## 2012-09-24 ENCOUNTER — Other Ambulatory Visit (INDEPENDENT_AMBULATORY_CARE_PROVIDER_SITE_OTHER): Payer: Managed Care, Other (non HMO)

## 2012-09-24 DIAGNOSIS — Z0000 Encounter for general adult medical examination without abnormal findings: Secondary | ICD-10-CM

## 2012-09-24 LAB — BASIC METABOLIC PANEL
BUN: 18 mg/dL (ref 6–23)
Creatinine, Ser: 1.1 mg/dL (ref 0.4–1.5)
GFR: 77.08 mL/min (ref >60.00)
Glucose, Bld: 87 mg/dL (ref 70–99)

## 2012-09-24 LAB — CBC WITH DIFFERENTIAL/PLATELET
Basophils Absolute: 0 10*3/uL (ref 0.0–0.1)
Eosinophils Relative: 2.7 % (ref 0.0–5.0)
Lymphs Abs: 1.9 10*3/uL (ref 0.7–4.0)
Monocytes Absolute: 0.5 10*3/uL (ref 0.1–1.0)
Monocytes Relative: 10.5 % (ref 3.0–12.0)
Neutrophils Relative %: 48.9 % (ref 43.0–77.0)
Platelets: 190 10*3/uL (ref 150.0–400.0)
RDW: 13.6 % (ref 11.5–14.6)
WBC: 5.2 10*3/uL (ref 4.5–10.5)

## 2012-09-24 LAB — POCT URINALYSIS DIPSTICK
Bilirubin, UA: NEGATIVE
Blood, UA: NEGATIVE
Ketones, UA: NEGATIVE
Leukocytes, UA: NEGATIVE
Protein, UA: NEGATIVE
Spec Grav, UA: 1.015
pH, UA: 7

## 2012-09-24 LAB — HEPATIC FUNCTION PANEL
ALT: 20 U/L (ref 0–53)
AST: 25 U/L (ref 0–37)
Bilirubin, Direct: 0.1 mg/dL (ref 0.0–0.3)
Total Bilirubin: 1.1 mg/dL (ref 0.3–1.2)

## 2012-09-24 LAB — LIPID PANEL
HDL: 58.5 mg/dL (ref >39.00)
LDL Cholesterol: 115 mg/dL — ABNORMAL HIGH (ref 0–99)
Total CHOL/HDL Ratio: 3
VLDL: 7.6 mg/dL (ref 0.0–40.0)

## 2012-09-24 LAB — TSH: TSH: 1.56 u[IU]/mL (ref 0.35–5.50)

## 2012-09-25 LAB — MEASLES/MUMPS/RUBELLA IMMUNITY
Mumps IgG: >300 AU/mL — ABNORMAL HIGH (ref <9.00)
Rubella: 6.56 Index — ABNORMAL HIGH (ref <0.90)

## 2012-09-28 LAB — VARICELLA ZOSTER ANTIBODY, IGM: Varicella Zoster Ab IgM: 0.09 {ISR} (ref <0.91)

## 2012-10-06 ENCOUNTER — Encounter: Payer: Self-pay | Admitting: Family Medicine

## 2012-10-06 ENCOUNTER — Ambulatory Visit (INDEPENDENT_AMBULATORY_CARE_PROVIDER_SITE_OTHER): Payer: Managed Care, Other (non HMO) | Admitting: Family Medicine

## 2012-10-06 VITALS — BP 98/74 | Temp 98.6°F | Ht 64.75 in | Wt 144.0 lb

## 2012-10-06 DIAGNOSIS — Z0000 Encounter for general adult medical examination without abnormal findings: Secondary | ICD-10-CM

## 2012-10-06 DIAGNOSIS — Z2911 Encounter for prophylactic immunotherapy for respiratory syncytial virus (RSV): Secondary | ICD-10-CM

## 2012-10-06 DIAGNOSIS — Z23 Encounter for immunization: Secondary | ICD-10-CM

## 2012-10-06 NOTE — Progress Notes (Signed)
  Subjective:    Patient ID: Jay Combs, male    DOB: 27-Dec-1955, 57 y.o.   MRN: [id number]  HPI Patient seen for complete physical. Has history of GERD which has been well controlled. He remains on Dexilant. Colonoscopy 4 years ago. Tetanus up-to-date. He requests shingles vaccine. He is checking his insurance which apparently covers this.  Patient has never smoked. Exercises fairly regularly. No recent chest pains. Appetite and weight are stable.  Past Medical History  Diagnosis Date  . INSOMNIA, CHRONIC 08/09/2008  . NONSUPPRATV OTITIS MEDIA NOT SPEC AS ACUT/CHRON 06/16/2008  . EUSTACHIAN TUBE DYSFUNCTION, BILATERAL 06/05/2008  . BRADYCARDIA 09/25/2006  . Acute maxillary sinusitis 05/22/2009  . BRONCHITIS, ACUTE WITH MILD BRONCHOSPASM 12/13/2007  . ALLERGIC RHINITIS 09/25/2006  . PALPITATIONS, CHRONIC 10/28/2007  . SEPTIC/HYPOVOLEMIC SHOCK 01/14/2008  . CHEST PAIN UNSPECIFIED 10/28/2007   Past Surgical History  Procedure Laterality Date  . Nasal septum surgery      reports that he has never smoked. He has never used smokeless tobacco. He reports that he drinks about 3.0 ounces of alcohol per week. He reports that he does not use illicit drugs. family history includes Anxiety disorder in his other; Heart disease in his other; Hypertension in his other; Uterine cancer in his mother. Allergies  Allergen Reactions  . Demerol [Meperidine] Other (See Comments)    Decreased BP  . Meperidine Hcl     REACTION: decreased BP      Review of Systems  Constitutional: Negative for fever, activity change, appetite change, fatigue and unexpected weight change.  HENT: Negative for ear pain, congestion and trouble swallowing.   Eyes: Negative for pain and visual disturbance.  Respiratory: Negative for cough, shortness of breath and wheezing.   Cardiovascular: Negative for chest pain and palpitations.  Gastrointestinal: Negative for nausea, vomiting, abdominal pain, diarrhea, constipation, blood  in stool, abdominal distention and rectal pain.  Genitourinary: Negative for dysuria, hematuria and testicular pain.  Musculoskeletal: Negative for joint swelling and arthralgias.  Skin: Negative for rash.  Neurological: Negative for dizziness, syncope and headaches.  Hematological: Negative for adenopathy.  Psychiatric/Behavioral: Negative for confusion and dysphoric mood.       Objective:   Physical Exam  Constitutional: He is oriented to person, place, and time. He appears well-developed and well-nourished. No distress.  HENT:  Head: Normocephalic and atraumatic.  Right Ear: External ear normal.  Left Ear: External ear normal.  Mouth/Throat: Oropharynx is clear and moist.  Eyes: Conjunctivae and EOM are normal. Pupils are equal, round, and reactive to light.  Neck: Normal range of motion. Neck supple. No thyromegaly present.  Cardiovascular: Normal rate, regular rhythm and normal heart sounds.   No murmur heard. Pulmonary/Chest: No respiratory distress. He has no wheezes. He has no rales.  Abdominal: Soft. Bowel sounds are normal. He exhibits no distension and no mass. There is no tenderness. There is no rebound and no guarding.  Genitourinary: Rectum normal and prostate normal.  Musculoskeletal: He exhibits no edema.  Lymphadenopathy:    He has no cervical adenopathy.  Neurological: He is alert and oriented to person, place, and time. He displays normal reflexes. No cranial nerve deficit.  Skin: No rash noted.  Psychiatric: He has a normal mood and affect.          Assessment & Plan:  Complete physical. Shingles vaccine per patient request. Colonoscopy up to date. PSA normal. Tetanus booster in 3 years. Continue yearly flu vaccine.

## 2012-10-06 NOTE — Patient Instructions (Addendum)
Consider yearly flu vaccine. 

## 2012-10-07 ENCOUNTER — Other Ambulatory Visit: Payer: Self-pay

## 2012-10-07 MED ORDER — DEXLANSOPRAZOLE 60 MG PO CPDR: 60.0000 mg | DELAYED_RELEASE_CAPSULE | Freq: Every day | ORAL | Status: DC

## 2013-03-10 ENCOUNTER — Ambulatory Visit (INDEPENDENT_AMBULATORY_CARE_PROVIDER_SITE_OTHER): Payer: Managed Care, Other (non HMO) | Admitting: Family Medicine

## 2013-03-10 ENCOUNTER — Encounter: Payer: Self-pay | Admitting: Family Medicine

## 2013-03-10 VITALS — BP 112/68 | HR 56 | Temp 97.7°F | Wt 143.0 lb

## 2013-03-10 DIAGNOSIS — L853 Xerosis cutis: Secondary | ICD-10-CM

## 2013-03-10 DIAGNOSIS — L258 Unspecified contact dermatitis due to other agents: Secondary | ICD-10-CM

## 2013-03-10 MED ORDER — TRIAMCINOLONE ACETONIDE 0.1 % EX CREA: 1.0000 "application " | TOPICAL_CREAM | Freq: Two times a day (BID) | CUTANEOUS | Status: DC | PRN

## 2013-03-10 NOTE — Progress Notes (Signed)
Pre visit review using our clinic review tool, if applicable. No additional management support is needed unless otherwise documented below in the visit note. 

## 2013-03-10 NOTE — Patient Instructions (Signed)
Avoid prolonged bathing Add Eucerin or other moisturizer 1:1 with steroid cream and use twice daily as needed

## 2013-03-10 NOTE — Progress Notes (Signed)
   Subjective:    Patient ID: Jay Combs, male    DOB: 11-Aug-1955, 58 y.o.   MRN: [id number]  HPI Patient seen with pruritic skin rash on his lower trunk bilaterally and forearms bilaterally. Onset 2 days ago. He was concerned apparently about shingles but he had not had any blistering rash or painful rash. He has generally dry skin. Uses Dove soap. He occasionally uses moisturizers but not regularly. No change of detergent or fabric softener. No exacerbating factors. No alleviating factors.  Past Medical History  Diagnosis Date  . INSOMNIA, CHRONIC 08/09/2008  . NONSUPPRATV OTITIS MEDIA NOT SPEC AS ACUT/CHRON 06/16/2008  . EUSTACHIAN TUBE DYSFUNCTION, BILATERAL 06/05/2008  . BRADYCARDIA 09/25/2006  . Acute maxillary sinusitis 05/22/2009  . BRONCHITIS, ACUTE WITH MILD BRONCHOSPASM 12/13/2007  . ALLERGIC RHINITIS 09/25/2006  . PALPITATIONS, CHRONIC 10/28/2007  . SEPTIC/HYPOVOLEMIC SHOCK 01/14/2008  . CHEST PAIN UNSPECIFIED 10/28/2007   Past Surgical History  Procedure Laterality Date  . Nasal septum surgery      reports that he has never smoked. He has never used smokeless tobacco. He reports that he drinks about 3.0 ounces of alcohol per week. He reports that he does not use illicit drugs. family history includes Anxiety disorder in his other; Heart disease in his other; Hypertension in his other; Uterine cancer in his mother. Allergies  Allergen Reactions  . Demerol [Meperidine] Other (See Comments)    Decreased BP  . Meperidine Hcl     REACTION: decreased BP      Review of Systems  Constitutional: Negative for fever and chills.  Skin: Positive for rash.       Objective:   Physical Exam  Constitutional: He appears well-developed and well-nourished. No distress.  Cardiovascular: Normal rate and regular rhythm.   Pulmonary/Chest: Effort normal and breath sounds normal. No respiratory distress. He has no wheezes. He has no rales.  Skin: Rash noted.  Patient has nonspecific  slightly dry minimally erythematous scaly rash on both forearms bilaterally and lower trunk bilaterally. No vesicles. No pustules.   night        Assessment & Plan:  Dry skin dermatitis. Continue Dove soap. Avoid prolonged bathing or hot showers. Triamcinolone 0.1% cream use 50-50 with Eucerin cream especially after bathing.

## 2013-06-17 ENCOUNTER — Ambulatory Visit (INDEPENDENT_AMBULATORY_CARE_PROVIDER_SITE_OTHER): Payer: Managed Care, Other (non HMO) | Admitting: Family Medicine

## 2013-06-17 ENCOUNTER — Encounter: Payer: Self-pay | Admitting: Family Medicine

## 2013-06-17 VITALS — BP 124/66 | HR 62 | Wt 140.0 lb

## 2013-06-17 DIAGNOSIS — R42 Dizziness and giddiness: Secondary | ICD-10-CM

## 2013-06-17 NOTE — Progress Notes (Signed)
Pre visit review using our clinic review tool, if applicable. No additional management support is needed unless otherwise documented below in the visit note. 

## 2013-06-17 NOTE — Progress Notes (Signed)
   Subjective:    Patient ID: Jay Combs, male    DOB: April 21, 1955, 58 y.o.   MRN: [id number]  Dizziness Pertinent negatives include no abdominal pain, chest pain, chills, coughing, fever or weakness.   Patient seen for chief complaint of dizziness. He states for about one month now has had intermittent symptoms of lightheadedness which occur inconsistently after periods of sitting and first standing. Symptoms are usually very transient only lasting seconds. No history of syncope. No chest pain. He exercises regularly about 5 days per week and has never had any exercise intolerance. He's never had dizziness with exercise. He has history of GERD and takes proton pump inhibitor, otherwise no regular medications. Denies any vertigo symptoms.  Patient had echocardiogram back in 2013 with grade 2 diastolic dysfunction and EF 60-65%. No history of anemia. Generally stays well-hydrated. Minimal caffeine use. No recent alcohol use. He is not aware of any recent palpitations.  Past Medical History  Diagnosis Date  . INSOMNIA, CHRONIC 08/09/2008  . NONSUPPRATV OTITIS MEDIA NOT SPEC AS ACUT/CHRON 06/16/2008  . EUSTACHIAN TUBE DYSFUNCTION, BILATERAL 06/05/2008  . BRADYCARDIA 09/25/2006  . Acute maxillary sinusitis 05/22/2009  . BRONCHITIS, ACUTE WITH MILD BRONCHOSPASM 12/13/2007  . ALLERGIC RHINITIS 09/25/2006  . PALPITATIONS, CHRONIC 10/28/2007  . SEPTIC/HYPOVOLEMIC SHOCK 01/14/2008  . CHEST PAIN UNSPECIFIED 10/28/2007   Past Surgical History  Procedure Laterality Date  . Nasal septum surgery      reports that he has never smoked. He has never used smokeless tobacco. He reports that he drinks about 3 ounces of alcohol per week. He reports that he does not use illicit drugs. family history includes Anxiety disorder in his other; Heart disease in his other; Hypertension in his other; Uterine cancer in his mother. Allergies  Allergen Reactions  . Demerol [Meperidine] Other (See Comments)    Decreased BP    . Meperidine Hcl     REACTION: decreased BP      Review of Systems  Constitutional: Negative for fever, chills, appetite change and unexpected weight change.  HENT: Negative for sinus pressure.   Respiratory: Negative for cough and shortness of breath.   Cardiovascular: Negative for chest pain, palpitations and leg swelling.  Gastrointestinal: Negative for abdominal pain.  Neurological: Positive for dizziness. Negative for syncope and weakness.  Hematological: Negative for adenopathy.       Objective:   Physical Exam  Constitutional: He is oriented to person, place, and time. He appears well-developed and well-nourished.  Neck:  No carotid bruits  Cardiovascular: Normal rate and regular rhythm.  Exam reveals no gallop.   No murmur heard. Pulmonary/Chest: Effort normal. He has no wheezes. He has no rales.  Musculoskeletal: He exhibits no edema.  Neurological: He is alert and oriented to person, place, and time. No cranial nerve deficit. Coordination normal.          Assessment & Plan:  Transient dizziness. He is describing what sounds like mild orthostatic symptoms. Sitting blood pressure today 110/70 and standing left arm with regular size cuff 102/78. Patient asymptomatic at this time. He has normal heart exam. We've recommended liberalizing sodium intake somewhat and fluid intake. Observe for now. Consider event monitor if symptoms persist or worsen

## 2013-06-17 NOTE — Patient Instructions (Signed)
Stay well hydrated and consider liberalizing sodium intake somewhat Change positions slowly. Let me know if symptoms persist or worsen and will can get event cardiac monitor.

## 2013-07-21 ENCOUNTER — Ambulatory Visit (INDEPENDENT_AMBULATORY_CARE_PROVIDER_SITE_OTHER): Payer: Managed Care, Other (non HMO) | Admitting: Gastroenterology

## 2013-07-21 ENCOUNTER — Encounter: Payer: Self-pay | Admitting: Gastroenterology

## 2013-07-21 VITALS — BP 100/70 | HR 64 | Ht 65.0 in | Wt 139.2 lb

## 2013-07-21 DIAGNOSIS — K219 Gastro-esophageal reflux disease without esophagitis: Secondary | ICD-10-CM

## 2013-07-21 DIAGNOSIS — R141 Gas pain: Secondary | ICD-10-CM

## 2013-07-21 DIAGNOSIS — R142 Eructation: Secondary | ICD-10-CM

## 2013-07-21 DIAGNOSIS — R143 Flatulence: Secondary | ICD-10-CM

## 2013-07-21 DIAGNOSIS — R110 Nausea: Secondary | ICD-10-CM

## 2013-07-21 MED ORDER — DEXLANSOPRAZOLE 60 MG PO CPDR: 60.0000 mg | DELAYED_RELEASE_CAPSULE | Freq: Every day | ORAL | Status: DC

## 2013-07-21 NOTE — Progress Notes (Signed)
    History of Present Illness: This is a 58 year old male with a history of GERD. Nausea and belching has been a component of his reflux symptoms in the past. His symptoms are under excellent control for many months but over the past 2 weeks he has noted intermittent nausea and belching. He notes some dietary stressors over the past few weeks that may have precipitated his symptoms. His symptoms did not respond to pantoprazole to daily or twice daily in the past. Dexansoprazole has been much more effective for control of his reflux. He underwent upper endoscopy in April 2014 which was entirely normal.  Current Medications, Allergies, Past Medical History, Past Surgical History, Family History and Social History were reviewed in [employer] record.  Physical Exam: General: Well developed , well nourished, no acute distress Head: Normocephalic and atraumatic Eyes:  sclerae anicteric, EOMI Ears: Normal auditory acuity Mouth: No deformity or lesions Lungs: Clear throughout to auscultation Heart: Regular rate and rhythm; no murmurs, rubs or bruits Abdomen: Soft, non tender and non distended. No masses, hepatosplenomegaly or hernias noted. Normal Bowel sounds Musculoskeletal: Symmetrical with no gross deformities  Pulses:  Normal pulses noted Extremities: No clubbing, cyanosis, edema or deformities noted Neurological: Alert oriented x 4, grossly nonfocal Psychological:  Alert and cooperative. Normal mood and affect  Assessment and Recommendations:  1. GERD with nausea, belching. Reintensify all antireflux measures. Will attempt to get dexlansoprazole 60 mg approved by his insurance company. If not he may need another PPI twice daily. Add ranitidine 150 mg every evening for breakthrough symptoms on dexlansoprazole.

## 2013-07-21 NOTE — Patient Instructions (Signed)
Please start Dexilant samples one tablet by mouth once daily. A prescription has been sent to your pharmacy. Try to use the Dexilant savings card.  You can use over the counter Zantac 150 mg one tablet by mouth at bedtime for breakthrough reflux symptoms.  Thank you for choosing me and Jay Combs.  [name]. [name]., MD., Marval Regal

## 2013-07-26 ENCOUNTER — Telehealth: Payer: Self-pay | Admitting: Gastroenterology

## 2013-07-26 NOTE — Telephone Encounter (Signed)
Called patient back, patient stated he never heard back about his Dexilant being approved. Patient said he was told Dr. Lynne Leader nurse will call the insurance company and see if he can have [place]. I advised patient I will call the pharmacy and see what is going on with his prescription and this fixed for him. I advised patient that sometimes they do require a prior authorization with his insurance and I can request it to be faxed so I can begin to work on it. I advised patient I will call him back once I have an answer for him. Patient verbalized understanding.  I called pharmacy and per Alyse Low at pharmacy, prior Josem Kaufmann is needed. Christy faxed over prior auth information. I called [employer] at [phone]. I spoke with Abby N. Per York Ram., Dexilant was approved for 24 months. Spoke with pharmacy to advise Dexilant was approved. Called patient to tell him that prior auth was approved but cost of medication could still be high because Dexilant is a higher tier medication. Patient stated he understood, he stated that he had a savings card that was given to him by Jamal Collin and he will go to pharmacy with savings card tomorrow and see how much Dexilant will cost him, he still has one bottle of samples left. Patient stated he will call me back tomorrow because Dr. Fuller Plan told him that if Dexilant is too expensive he can try Nexium twice daily. Patient will call back tomorrow.

## 2013-07-28 ENCOUNTER — Telehealth: Payer: Self-pay | Admitting: Gastroenterology

## 2013-10-05 ENCOUNTER — Telehealth: Payer: Self-pay | Admitting: Cardiovascular Disease

## 2013-10-05 NOTE — Telephone Encounter (Signed)
WILL FORWARD  TO  DR [name]  APPEARS  ECHO  FROM   03-21-11 STATES  HAS   GRADE  2 DIASTOLIC  DYSFUNCTION./CY

## 2013-10-05 NOTE — Telephone Encounter (Signed)
New message          Pt filled out for long term disability and it is showing that he has a grade 2 diastolic dysfunction / pt would like clarity on this diagnosis in his file

## 2013-10-11 NOTE — Telephone Encounter (Signed)
Not clinically significant

## 2013-10-12 NOTE — Telephone Encounter (Signed)
Patient is returning your call. Please call back.  °

## 2013-10-12 NOTE — Telephone Encounter (Signed)
LMTCB ./CY 

## 2013-10-12 NOTE — Telephone Encounter (Signed)
Pt filled out for long term disability and it is showing he has a grade 2 diastolic dysfunction in his medical records. He would like to discuss this with someone. He is upset that no one has called him back.

## 2013-10-12 NOTE — Telephone Encounter (Signed)
Follow up ° ° ° ° ° °Returning Christine's call °

## 2013-10-12 NOTE — Telephone Encounter (Signed)
DR  [name]  AND  MYSELF  SPOKE  WITH  PT   QUESTIONS  ANSWERED PT  TO CALL IF WISHES  TO  SET UP F/U  APPT  WITH ECHO SAME DAY .Adonis Housekeeper

## 2013-10-24 ENCOUNTER — Telehealth: Payer: Self-pay | Admitting: Cardiovascular Disease

## 2013-10-24 DIAGNOSIS — R002 Palpitations: Secondary | ICD-10-CM

## 2013-10-24 NOTE — Telephone Encounter (Signed)
Spoke with patient who states he has been talking with Dr. [name] and Altha Harm about the need for f/u on a previous diagnosis of grade 2 diastolic dysfunction and per Altha Harm patient can have echocardiogram prior to visit with Dr. [name] for further evaluation.  Patient c/o palpitations and states he has history of same; denies SOB, chest pain.  I scheduled patient for echo on same day as ov with Dr. [name].  Patient verbalized understanding and agreement and thanked me for the call.

## 2013-10-24 NOTE — Telephone Encounter (Signed)
New message     Calling to give Jay Combs an update

## 2013-11-03 ENCOUNTER — Ambulatory Visit (HOSPITAL_COMMUNITY): Payer: Managed Care, Other (non HMO) | Attending: Cardiovascular Disease

## 2013-11-03 DIAGNOSIS — I498 Other specified cardiac arrhythmias: Secondary | ICD-10-CM | POA: Insufficient documentation

## 2013-11-03 DIAGNOSIS — R002 Palpitations: Secondary | ICD-10-CM | POA: Diagnosis present

## 2013-11-03 DIAGNOSIS — J40 Bronchitis, not specified as acute or chronic: Secondary | ICD-10-CM | POA: Diagnosis not present

## 2013-11-03 NOTE — Progress Notes (Signed)
2D Echo completed. 11/03/2013 

## 2013-11-08 ENCOUNTER — Other Ambulatory Visit (HOSPITAL_COMMUNITY): Payer: Managed Care, Other (non HMO)

## 2013-11-08 ENCOUNTER — Encounter: Payer: Self-pay | Admitting: Cardiovascular Disease

## 2013-11-08 ENCOUNTER — Ambulatory Visit (INDEPENDENT_AMBULATORY_CARE_PROVIDER_SITE_OTHER): Payer: Managed Care, Other (non HMO) | Admitting: Cardiovascular Disease

## 2013-11-08 VITALS — BP 112/72 | HR 66 | Ht 66.0 in | Wt 143.4 lb

## 2013-11-08 DIAGNOSIS — I498 Other specified cardiac arrhythmias: Secondary | ICD-10-CM

## 2013-11-08 DIAGNOSIS — I5032 Chronic diastolic (congestive) heart failure: Secondary | ICD-10-CM

## 2013-11-08 DIAGNOSIS — I509 Heart failure, unspecified: Secondary | ICD-10-CM

## 2013-11-08 DIAGNOSIS — R002 Palpitations: Secondary | ICD-10-CM

## 2013-11-08 NOTE — Assessment & Plan Note (Signed)
Resolved Due to type A personality and stress  Normal ECG no structural heart disease

## 2013-11-08 NOTE — Assessment & Plan Note (Signed)
Does not have.  Echo 2 years ago likely reflected over hydration prior to test.  Mitral inflow patterns known to be labile and not very accurate in isolation ( no E/E' pulmonary vein flow or strain imaging done)  F/U echo this month with normal diastolic parameters and no LVH  Letter written to [facility] regarding normal cardiac status

## 2013-11-08 NOTE — Patient Instructions (Signed)
Your physician recommends that you schedule a follow-up appointment in: AS NEEDED  Your physician recommends that you continue on your current medications as directed. Please refer to the Current Medication list given to you today.  

## 2013-11-08 NOTE — Progress Notes (Signed)
Patient ID: Fount Bahe, male   DOB: 20-Jan-1956, 58 y.o.   MRN: [id number] Mr. Denman George is a 58 year old patient seen in past for benign palpitations atypical chest pain with normal myove  Echo 7001 with diastolic dysfunction but no symptoms and no LVH F/U echo done 9/15  Normal with no diastolic dysfunction Some stress as he lost his job this past year.  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     Study Conclusions  - Left ventricle: Low normal EF. The cavity size was normal. Wall thickness was normal. Systolic function was normal. The estimated ejection fraction was in the range of 50% to 55%. Left ventricular diastolic function parameters were normal. - Right atrium: The atrium was mildly dilated.      ROS: Denies fever, malais, weight loss, blurry vision, decreased visual acuity, cough, sputum, SOB, hemoptysis, pleuritic pain, palpitaitons, heartburn, abdominal pain, melena, lower extremity edema, claudication, or rash.  All other systems reviewed and negative  General: Affect appropriate Healthy:  appears stated age 67: normal Neck supple with no adenopathy JVP normal no bruits no thyromegaly Lungs clear with no wheezing and good diaphragmatic motion Heart:  S1/S2 no murmur, no rub, gallop or click PMI normal Abdomen: benighn, BS positve, no tenderness, no AAA no bruit.  No HSM or HJR Distal pulses intact with no bruits No edema Neuro non-focal Skin warm and dry No muscular weakness   No current outpatient prescriptions on file.   No current facility-administered medications for this visit.    Allergies  Demerol and Meperidine hcl  Electrocardiogram:  SR rate 57  Normal   Assessment and Plan

## 2013-11-08 NOTE — Assessment & Plan Note (Signed)
Functional sign of good aerobic shape no AV block ECG normal no w/u or diagnostic studies needed

## 2013-12-05 ENCOUNTER — Ambulatory Visit (INDEPENDENT_AMBULATORY_CARE_PROVIDER_SITE_OTHER): Payer: Managed Care, Other (non HMO)

## 2013-12-05 DIAGNOSIS — Z23 Encounter for immunization: Secondary | ICD-10-CM

## 2014-02-08 ENCOUNTER — Encounter: Payer: Self-pay | Admitting: Family Medicine

## 2014-02-08 ENCOUNTER — Ambulatory Visit (INDEPENDENT_AMBULATORY_CARE_PROVIDER_SITE_OTHER): Payer: Managed Care, Other (non HMO) | Admitting: Family Medicine

## 2014-02-08 VITALS — BP 98/70 | HR 64 | Temp 98.7°F | Wt 142.0 lb

## 2014-02-08 DIAGNOSIS — J209 Acute bronchitis, unspecified: Secondary | ICD-10-CM

## 2014-02-08 MED ORDER — HYDROCODONE-HOMATROPINE 5-1.5 MG/5ML PO SYRP: 5.0000 mL | ORAL_SOLUTION | Freq: Four times a day (QID) | ORAL | Status: AC | PRN

## 2014-02-08 NOTE — Patient Instructions (Signed)

## 2014-02-08 NOTE — Progress Notes (Signed)
   Subjective:    Patient ID: Jay Combs, male    DOB: 04-01-1955, 58 y.o.   MRN: [id number]  HPI Acute illness. Patient seen with chills, body aches reactive cough and possible low-grade fever. Onset about 3 days ago. Wife with similar symptoms. He's taken over-the-counter Mucinex. He had some slight nausea but no vomiting. No abdominal pain. Denies any sore throat. Cough is been moderate. Patient is nonsmoker  Past Medical History  Diagnosis Date  . INSOMNIA, CHRONIC 08/09/2008  . NONSUPPRATV OTITIS MEDIA NOT SPEC AS ACUT/CHRON 06/16/2008  . EUSTACHIAN TUBE DYSFUNCTION, BILATERAL 06/05/2008  . BRADYCARDIA 09/25/2006  . Acute maxillary sinusitis 05/22/2009  . BRONCHITIS, ACUTE WITH MILD BRONCHOSPASM 12/13/2007  . ALLERGIC RHINITIS 09/25/2006  . PALPITATIONS, CHRONIC 10/28/2007  . SEPTIC/HYPOVOLEMIC SHOCK 01/14/2008  . CHEST PAIN UNSPECIFIED 10/28/2007   Past Surgical History  Procedure Laterality Date  . Nasal septum surgery      reports that he has never smoked. He has never used smokeless tobacco. He reports that he drinks about 3.0 oz of alcohol per week. He reports that he does not use illicit drugs. family history includes Anxiety disorder in his other; Heart disease in his other; Hypertension in his other; Uterine cancer in his mother. Allergies  Allergen Reactions  . Demerol [Meperidine] Other (See Comments)    Decreased BP  . Meperidine Hcl     REACTION: decreased BP      Review of Systems  Constitutional: Positive for fever, chills and fatigue.  HENT: Positive for congestion. Negative for sore throat.   Respiratory: Positive for cough. Negative for shortness of breath and wheezing.   Cardiovascular: Negative for chest pain.  Musculoskeletal: Positive for myalgias.       Objective:   Physical Exam  Constitutional: He appears well-developed and well-nourished.  HENT:  Right Ear: External ear normal.  Left Ear: External ear normal.  Mouth/Throat: Oropharynx is clear  and moist.  Neck: Neck supple.  Cardiovascular: Normal rate and regular rhythm.   Pulmonary/Chest: Effort normal and breath sounds normal. No respiratory distress. He has no wheezes. He has no rales.  Lymphadenopathy:    He has no cervical adenopathy.          Assessment & Plan:  Viral syndrome. Suspect acute viral bronchitis. Hycodan cough syrup 1 teaspoon daily at bedtime for severe cough. Follow-up promptly for fever or worsening symptoms

## 2014-02-08 NOTE — Progress Notes (Signed)
Pre visit review using our clinic review tool, if applicable. No additional management support is needed unless otherwise documented below in the visit note. 

## 2014-02-09 ENCOUNTER — Encounter: Payer: Self-pay | Admitting: Family Medicine

## 2014-02-09 ENCOUNTER — Telehealth: Payer: Self-pay | Admitting: Family Medicine

## 2014-02-09 MED ORDER — AZITHROMYCIN 250 MG PO TABS: ORAL_TABLET | ORAL | Status: DC

## 2014-02-09 NOTE — Telephone Encounter (Signed)
Rx sent to pharmacy and Pt is aware 

## 2014-02-09 NOTE — Telephone Encounter (Signed)
Pt stating that he is spitting up some greenish mucus. Pt stated that he is taking the Hycodan.

## 2014-02-09 NOTE — Telephone Encounter (Signed)
This is probably still viral but he feels like things are worsening go ahead and start Zithromax-Z-Pak for 5 days

## 2014-02-09 NOTE — Telephone Encounter (Signed)
Pt was seen yesterday and would like abx [facility] [address]. Pt would like to speak to nurse first.

## 2014-03-30 ENCOUNTER — Encounter: Payer: Self-pay | Admitting: Family Medicine

## 2014-03-30 ENCOUNTER — Ambulatory Visit (INDEPENDENT_AMBULATORY_CARE_PROVIDER_SITE_OTHER): Payer: 59 | Admitting: Family Medicine

## 2014-03-30 VITALS — BP 110/70 | HR 68 | Temp 97.8°F | Wt 144.0 lb

## 2014-03-30 DIAGNOSIS — R2689 Other abnormalities of gait and mobility: Secondary | ICD-10-CM

## 2014-03-30 DIAGNOSIS — R519 Headache, unspecified: Secondary | ICD-10-CM

## 2014-03-30 DIAGNOSIS — R29818 Other symptoms and signs involving the nervous system: Secondary | ICD-10-CM

## 2014-03-30 DIAGNOSIS — R51 Headache: Secondary | ICD-10-CM

## 2014-03-30 NOTE — Progress Notes (Signed)
Subjective:    Patient ID: Jay Combs, male    DOB: 1955/02/19, 59 y.o.   MRN: [id number]  HPI Patient seen with chief complaint of dull intermittent pain occipital area for the past month or so. His symptoms are very intermittent with no clear triggers. He occasionally has dull pain up to 8 out of 10 severity at it's worst. Sometimes last several hours. No clear exacerbating factors. Nonexertional and non-positional. He has tried Advil couple of occasions which helps. He denies any burning or stinging pain. He has had tension headaches in the past and states this is different. His symptoms are near the midline. No radiation. Nontender scalp. No rash. No associated appetite or weight changes. No visual changes. No nausea or vomiting.  Patient relates right eyelid feels more droopy at times over the past few weeks. He has not noted any weakness with extraocular movements. No facial weakness. No numbness. No blurred vision or diplopia.  Occasionally feels "off balance ". He describes episode where he was on the treadmill back prior to Christmas and he felt somewhat off balance suddenly. He never lost consciousness and did not fall. Has couple times noticed when getting dressed in the morning that he feels somewhat off balance. He has not had any recent vertigo symptoms. No ataxia. No focal weakness. Denies any extremity numbness  Past Medical History  Diagnosis Date  . INSOMNIA, CHRONIC 08/09/2008  . NONSUPPRATV OTITIS MEDIA NOT SPEC AS ACUT/CHRON 06/16/2008  . EUSTACHIAN TUBE DYSFUNCTION, BILATERAL 06/05/2008  . BRADYCARDIA 09/25/2006  . Acute maxillary sinusitis 05/22/2009  . BRONCHITIS, ACUTE WITH MILD BRONCHOSPASM 12/13/2007  . ALLERGIC RHINITIS 09/25/2006  . PALPITATIONS, CHRONIC 10/28/2007  . SEPTIC/HYPOVOLEMIC SHOCK 01/14/2008  . CHEST PAIN UNSPECIFIED 10/28/2007   Past Surgical History  Procedure Laterality Date  . Nasal septum surgery      reports that he has never smoked. He has never  used smokeless tobacco. He reports that he drinks about 3.0 oz of alcohol per week. He reports that he does not use illicit drugs. family history includes Anxiety disorder in his other; Heart disease in his other; Hypertension in his other; Uterine cancer in his mother. Allergies  Allergen Reactions  . Demerol [Meperidine] Other (See Comments)    Decreased BP  . Meperidine Hcl     REACTION: decreased BP      Review of Systems  Constitutional: Negative for fever, chills, appetite change and unexpected weight change.  Eyes: Negative for visual disturbance.  Respiratory: Negative for shortness of breath.   Cardiovascular: Negative for chest pain.  Neurological: Positive for headaches. Negative for tremors, seizures, syncope, facial asymmetry, speech difficulty, weakness and numbness.  Psychiatric/Behavioral: Negative for confusion.       Objective:   Physical Exam  Constitutional: He is oriented to person, place, and time. He appears well-developed and well-nourished.  HENT:  Head: Normocephalic and atraumatic.  Right Ear: External ear normal.  Left Ear: External ear normal.  Mouth/Throat: Oropharynx is clear and moist.  Neck: Neck supple. No thyromegaly present.  Cardiovascular: Normal rate and regular rhythm.   No murmur heard. Pulmonary/Chest: Effort normal and breath sounds normal. No respiratory distress. He has no wheezes. He has no rales.  Neurological: He is alert and oriented to person, place, and time. No cranial nerve deficit. Coordination normal.  No focal weakness. Cranial nerves II through XII are intact. We cannot appreciate any clear weakness with his right lid. Extraocular movements are all normal. Gait is normal. Normal finger  to nose. Romberg normal. Babinski's are somewhat equivocal-minimal response Symmetric reflexes throughout          Assessment & Plan:  Patient presents with atypical occipital headache. This does not sound like typical tension  headache nor occipital neuralgia. Symptoms are somewhat intermittent. He has somewhat soft complaints of feeling "off balance "and possible intermittent weakness right eyelid with drooping but this was not confirmed today on exam. No other evidence for oculomotor involvement. Set up neurology referral

## 2014-03-30 NOTE — Patient Instructions (Signed)
We will call you with Neurology appointment Follow up immediately for any progressive headache, recurrent vomiting, visual changes, or any other new symptoms

## 2014-03-30 NOTE — Progress Notes (Signed)
Pre visit review using our clinic review tool, if applicable. No additional management support is needed unless otherwise documented below in the visit note. 

## 2014-04-24 ENCOUNTER — Ambulatory Visit (INDEPENDENT_AMBULATORY_CARE_PROVIDER_SITE_OTHER): Payer: 59 | Admitting: Family Medicine

## 2014-04-24 ENCOUNTER — Encounter: Payer: Self-pay | Admitting: Family Medicine

## 2014-04-24 VITALS — BP 118/70 | HR 64 | Temp 98.2°F | Wt 143.0 lb

## 2014-04-24 DIAGNOSIS — R05 Cough: Secondary | ICD-10-CM

## 2014-04-24 DIAGNOSIS — R059 Cough, unspecified: Secondary | ICD-10-CM

## 2014-04-24 NOTE — Progress Notes (Signed)
Pre visit review using our clinic review tool, if applicable. No additional management support is needed unless otherwise documented below in the visit note. 

## 2014-04-24 NOTE — Progress Notes (Signed)
   Subjective:    Patient ID: Jay Combs, male    DOB: 12-17-1955, 59 y.o.   MRN: [id number]  HPI Patient seen with 4 week history of cough. He has never smoked. He is not feel particularly ill. He has not had any fever. No dyspnea. He is building back up and running and running up to 3 a half miles without difficulty. No postnasal drip symptoms. No active GERD. Couple weeks ago noticed some mild wheezing but improved since then. Appetite and weight are stable. No hemoptysis.  Past Medical History  Diagnosis Date  . INSOMNIA, CHRONIC 08/09/2008  . NONSUPPRATV OTITIS MEDIA NOT SPEC AS ACUT/CHRON 06/16/2008  . EUSTACHIAN TUBE DYSFUNCTION, BILATERAL 06/05/2008  . BRADYCARDIA 09/25/2006  . Acute maxillary sinusitis 05/22/2009  . BRONCHITIS, ACUTE WITH MILD BRONCHOSPASM 12/13/2007  . ALLERGIC RHINITIS 09/25/2006  . PALPITATIONS, CHRONIC 10/28/2007  . SEPTIC/HYPOVOLEMIC SHOCK 01/14/2008  . CHEST PAIN UNSPECIFIED 10/28/2007   Past Surgical History  Procedure Laterality Date  . Nasal septum surgery      reports that he has never smoked. He has never used smokeless tobacco. He reports that he drinks about 3.0 oz of alcohol per week. He reports that he does not use illicit drugs. family history includes Anxiety disorder in his other; Heart disease in his other; Hypertension in his other; Uterine cancer in his mother. Allergies  Allergen Reactions  . Demerol [Meperidine] Other (See Comments)    Decreased BP  . Meperidine Hcl     REACTION: decreased BP      Review of Systems  Constitutional: Negative for fever, chills, appetite change and unexpected weight change.  HENT: Negative for postnasal drip.   Respiratory: Positive for cough. Negative for shortness of breath.   Cardiovascular: Negative for chest pain.       Objective:   Physical Exam  Constitutional: He appears well-developed and well-nourished.  HENT:  Right Ear: External ear normal.  Left Ear: External ear normal.    Mouth/Throat: Oropharynx is clear and moist.  Neck: Neck supple.  Cardiovascular: Normal rate and regular rhythm.   Pulmonary/Chest: Effort normal and breath sounds normal. No respiratory distress. He has no wheezes. He has no rales.  Lymphadenopathy:    He has no cervical adenopathy.          Assessment & Plan:  Cough. Nonfocal exam. Suspect resolving viral process. Reassurance and observation. Consider chest x-ray in 2 weeks if not resolved

## 2014-04-24 NOTE — Patient Instructions (Signed)
Acute Bronchitis Bronchitis is inflammation of the airways that extend from the windpipe into the lungs (bronchi). The inflammation often causes mucus to develop. This leads to a cough, which is the most common symptom of bronchitis.  In acute bronchitis, the condition usually develops suddenly and goes away over time, usually in a couple weeks. Smoking, allergies, and asthma can make bronchitis worse. Repeated episodes of bronchitis may cause further lung problems.  CAUSES Acute bronchitis is most often caused by the same virus that causes a cold. The virus can spread from person to person (contagious) through coughing, sneezing, and touching contaminated objects. SIGNS AND SYMPTOMS   Cough.   Fever.   Coughing up mucus.   Body aches.   Chest congestion.   Chills.   Shortness of breath.   Sore throat.  DIAGNOSIS  Acute bronchitis is usually diagnosed through a physical exam. Your health care provider will also ask you questions about your medical history. Tests, such as chest X-rays, are sometimes done to rule out other conditions.  TREATMENT  Acute bronchitis usually goes away in a couple weeks. Oftentimes, no medical treatment is necessary. Medicines are sometimes given for relief of fever or cough. Antibiotic medicines are usually not needed but may be prescribed in certain situations. In some cases, an inhaler may be recommended to help reduce shortness of breath and control the cough. A cool mist vaporizer may also be used to help thin bronchial secretions and make it easier to clear the chest.  HOME CARE INSTRUCTIONS  Get plenty of rest.   Drink enough fluids to keep your urine clear or pale yellow (unless you have a medical condition that requires fluid restriction). Increasing fluids may help thin your respiratory secretions (sputum) and reduce chest congestion, and it will prevent dehydration.   Take medicines only as directed by your health care provider.  If  you were prescribed an antibiotic medicine, finish it all even if you start to feel better.  Avoid smoking and secondhand smoke. Exposure to cigarette smoke or irritating chemicals will make bronchitis worse. If you are a smoker, consider using nicotine gum or skin patches to help control withdrawal symptoms. Quitting smoking will help your lungs heal faster.   Reduce the chances of another bout of acute bronchitis by washing your hands frequently, avoiding people with cold symptoms, and trying not to touch your hands to your mouth, nose, or eyes.   Keep all follow-up visits as directed by your health care provider.  SEEK MEDICAL CARE IF: Your symptoms do not improve after 1 week of treatment.  SEEK IMMEDIATE MEDICAL CARE IF:  You develop an increased fever or chills.   You have chest pain.   You have severe shortness of breath.  You have bloody sputum.   You develop dehydration.  You faint or repeatedly feel like you are going to pass out.  You develop repeated vomiting.  You develop a severe headache. MAKE SURE YOU:   Understand these instructions.  Will watch your condition.  Will get help right away if you are not doing well or get worse. Document Released: 03/06/2004 Document Revised: 06/13/2013 Document Reviewed: 07/20/2012 [facility] Patient Information 2015 [place], [place]. This information is not intended to replace advice given to you by your health care provider. Make sure you discuss any questions you have with your health care provider.  Please be in touch in 2 weeks if cough no better.

## 2014-05-02 ENCOUNTER — Encounter: Payer: Self-pay | Admitting: Neurology

## 2014-05-02 ENCOUNTER — Ambulatory Visit (INDEPENDENT_AMBULATORY_CARE_PROVIDER_SITE_OTHER): Payer: 59 | Admitting: Neurology

## 2014-05-02 VITALS — BP 100/68 | HR 66 | Ht 66.0 in | Wt 144.1 lb

## 2014-05-02 DIAGNOSIS — R4189 Other symptoms and signs involving cognitive functions and awareness: Secondary | ICD-10-CM

## 2014-05-02 DIAGNOSIS — F919 Conduct disorder, unspecified: Secondary | ICD-10-CM

## 2014-05-02 DIAGNOSIS — R51 Headache: Secondary | ICD-10-CM

## 2014-05-02 DIAGNOSIS — R4689 Other symptoms and signs involving appearance and behavior: Secondary | ICD-10-CM

## 2014-05-02 DIAGNOSIS — R519 Headache, unspecified: Secondary | ICD-10-CM

## 2014-05-02 DIAGNOSIS — H02401 Unspecified ptosis of right eyelid: Secondary | ICD-10-CM

## 2014-05-02 LAB — COMPREHENSIVE METABOLIC PANEL
ALT: 12 U/L (ref 0–53)
AST: 20 U/L (ref 0–37)
Albumin: 4.4 g/dL (ref 3.5–5.2)
Alkaline Phosphatase: 47 U/L (ref 39–117)
BILIRUBIN TOTAL: 1 mg/dL (ref 0.2–1.2)
BUN: 16 mg/dL (ref 6–23)
CALCIUM: 9.1 mg/dL (ref 8.4–10.5)
CO2: 27 mEq/L (ref 19–32)
CREATININE: 0.95 mg/dL (ref 0.50–1.35)
Chloride: 105 mEq/L (ref 96–112)
GLUCOSE: 95 mg/dL (ref 70–99)
POTASSIUM: 4.6 meq/L (ref 3.5–5.3)
Sodium: 139 mEq/L (ref 135–145)
Total Protein: 6.7 g/dL (ref 6.0–8.3)

## 2014-05-02 LAB — VITAMIN B12: VITAMIN B 12: 514 pg/mL (ref 211–911)

## 2014-05-02 LAB — TSH: TSH: 2.46 u[IU]/mL (ref 0.350–4.500)

## 2014-05-02 NOTE — Patient Instructions (Addendum)
1.  MRI brain, MRA brian and carotids 2.  Check blood work today 3.  Return to clinic in 4-6 weeks

## 2014-05-02 NOTE — Progress Notes (Signed)
[facility] Clinic Note - Initial Visit   Date: 05/02/2014   Jay Combs MRN: [id number] DOB: [birth year]   Dear Dr. Elease Hashimoto:  Thank you for your kind referral of Jay Combs for consultation of headaches, ptosis, and memory changes. Although his history is well known to you, please allow [place] to reiterate it for the purpose of our medical record. The patient was accompanied to the clinic by self.    History of Present Illness: Jay Combs is a 59 y.o. right-handed Caucasian male with no significant medical problems presenting for evaluation of headaches, right eye ptosis, and memory changes.  Around January 2016, he developed dull pain over the base of his head, lasting about 1-2 hours and occuring daily for several weeks.  Advil significantly alleviated his pain. Headache has since resolved and he has not had anything over the past few weeks.  He took advil which helped.   He also noticed that the right eyelid droops, which is worse in the morning. No drooping of the face, double vision, dysphagia, dysarthria, jaw fatigue, or limb weakness. No associated numbness/tingling.         He stays active and says that he runs about 4 miles several times per week.  In December, he had a very brief spell of imbalance and presyncope after working out.  Since then, he has noticed a few things that are abnormal for him; for instance, he walked into the pantry to put the milk and realized what he was doing; on another occasion, he recalls misplacing apple cider vinegar on his counter instead of in the pantry.  On one occasion, he became confused when he was trying to shave and forgot which way to shave.  He reports being under an extreme amount of stress since he last his job in June 2016.  He is having difficulty trying to find clients and a job which has forced him to start using his savings.  He is ruminating a lot.  He was initially not sleeping well, but over the  past few months has been able to get more rest.  Mood is not that great.  He feels angry, bitter, and frustrated.  He feels discouraged when he is unable to find work because he is trying to find new clients.     Born in [place] and moved to [place] at the age of 42.  He moved to [place] in 1994.    Out-side paper records, electronic medical record, and images have been reviewed where available and summarized as:  Lab Results  Component Value Date   TSH 1.56 09/24/2012   XR lumbar spine 09/17/2007: 1. No acute fracture or listhesis identified in the lumbar spine. 2. Moderate lower lumbar facet arthropathy. Evidence of disc degeneration at L2-L3 but relatively preserved disc space heights.  Past Medical History  Diagnosis Date  . INSOMNIA, CHRONIC 08/09/2008  . NONSUPPRATV OTITIS MEDIA NOT SPEC AS ACUT/CHRON 06/16/2008  . EUSTACHIAN TUBE DYSFUNCTION, BILATERAL 06/05/2008  . BRADYCARDIA 09/25/2006  . Acute maxillary sinusitis 05/22/2009  . BRONCHITIS, ACUTE WITH MILD BRONCHOSPASM 12/13/2007  . ALLERGIC RHINITIS 09/25/2006  . PALPITATIONS, CHRONIC 10/28/2007  . SEPTIC/HYPOVOLEMIC SHOCK 01/14/2008  . CHEST PAIN UNSPECIFIED 10/28/2007    Past Surgical History  Procedure Laterality Date  . Nasal septum surgery       Medications:  No current outpatient prescriptions on file prior to visit.   No current facility-administered medications on file prior to visit.    Allergies:  Allergies  Allergen Reactions  . Demerol [Meperidine] Other (See Comments)    Decreased BP  . Meperidine Hcl     REACTION: decreased BP    Family History: Family History  Problem Relation Age of Onset  . Heart disease Other   . Hypertension Other   . Anxiety disorder Other   . Uterine cancer Mother     Living  . Aortic aneurysm Mother   . Healthy Father   . Healthy Sister   . Other Son     X-linked gammaglobulinemia  . Healthy Son     Social History: History   Social History  . Marital Status: Married     Spouse Name: N/A  . Number of Children: 2  . Years of Education: N/A   Occupational History  . management    Social History Main Topics  . Smoking status: Never Smoker   . Smokeless tobacco: Never Used  . Alcohol Use: 3.0 oz/week    5 Glasses of wine per week     Comment: 3-4 glasses per week, bottle of wine over the weekend  . Drug Use: No  . Sexual Activity: Not on file   Other Topics Concern  . Not on file   Social History Narrative   Lives with wife in a 2 story home.     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  Currently looking for full time work.  Has 2 sons.      Review of Systems:  CONSTITUTIONAL: No fevers, chills, night sweats, or weight loss.   EYES: No visual changes or eye pain ENT: No hearing changes.  No history of nose bleeds.   RESPIRATORY: No cough, wheezing and shortness of breath.   CARDIOVASCULAR: Negative for chest pain, and palpitations.   GI: Negative for abdominal discomfort, blood in stools or black stools.  No recent change in bowel habits.   GU:  No history of incontinence.   MUSCLOSKELETAL: No history of joint pain or swelling.  No myalgias.   SKIN: Negative for lesions, rash, and itching.   HEMATOLOGY/ONCOLOGY: Negative for prolonged bleeding, bruising easily, and swollen nodes.  No history of cancer.   ENDOCRINE: Negative for cold or heat intolerance, polydipsia or goiter.   PSYCH:  +depression or anxiety symptoms.   NEURO: As Above.   Vital Signs:  BP 100/68 mmHg  Pulse 66  Ht 5\' 6"  (1.676 m)  Wt 144 lb 1 oz (65.346 kg)  BMI 23.26 kg/m2  SpO2 99%   General Medical Exam:   General:  Anxious appearing, comfortable.   Eyes/ENT: see cranial nerve examination.   Neck: No masses appreciated.  Full range of motion without tenderness.  No carotid bruits. Respiratory:  Clear to auscultation, good air entry bilaterally.   Cardiac:  Regular rate and rhythm, no murmur.   Extremities:  No deformities,  edema, or skin discoloration.  Skin:  No rashes or lesions.  Neurological Exam: MENTAL STATUS including orientation to time, place, person, recent and remote memory, attention span and concentration, language, and fund of knowledge is fairly intact  Speech is not dysarthric. Montreal Cognitive Assessment  05/02/2014  Visuospatial/ Executive (0/5) 5  Naming (0/3) 2  Attention: Read list of digits (0/2) 1  Attention: Read list of letters (0/1) 1  Attention: Serial 7 subtraction starting at 100 (0/3) 3  Language: Repeat phrase (0/2) 2  Language : Fluency (0/1) 1  Abstraction (0/2) 2  Delayed Recall (0/5) 2  Orientation (0/6)  6  Total 25  Adjusted Score (based on education) 25   CRANIAL NERVES: II:  No visual field defects.  Unremarkable fundi.   III-IV-VI: Pupils equal round and reactive to light. No Horner's.  Normal conjugate, extra-ocular eye movements in all directions of gaze.  No nystagmus.  Mild right ptosis without worsening with sustained upward gaze   V:  Normal facial sensation.  VII:  Normal facial symmetry and movements.  No pathologic facial reflexes.  VIII:  Normal hearing and vestibular function.   IX-X:  Normal palatal movement.   XI:  Normal shoulder shrug and head rotation.   XII:  Normal tongue strength and range of motion, no deviation or fasciculation.  MOTOR:  No atrophy, fasciculations or abnormal movements.  No pronator drift.  Tone is normal.    Right Upper Extremity:    Left Upper Extremity:    Deltoid  5/5   Deltoid  5/5   Biceps  5/5   Biceps  5/5   Triceps  5/5   Triceps  5/5   Wrist extensors  5/5   Wrist extensors  5/5   Wrist flexors  5/5   Wrist flexors  5/5   Finger extensors  5/5   Finger extensors  5/5   Finger flexors  5/5   Finger flexors  5/5   Dorsal interossei  5/5   Dorsal interossei  5/5   Abductor pollicis  5/5   Abductor pollicis  5/5   Tone (Ashworth scale)  0  Tone (Ashworth scale)  0   Right Lower Extremity:    Left Lower  Extremity:    Hip flexors  5/5   Hip flexors  5/5   Hip extensors  5/5   Hip extensors  5/5   Knee flexors  5/5   Knee flexors  5/5   Knee extensors  5/5   Knee extensors  5/5   Dorsiflexors  5/5   Dorsiflexors  5/5   Plantarflexors  5/5   Plantarflexors  5/5   Toe extensors  5/5   Toe extensors  5/5   Toe flexors  5/5   Toe flexors  5/5   Tone (Ashworth scale)  0  Tone (Ashworth scale)  0   MSRs:  Right                                                                 Left brachioradialis 2+  brachioradialis 2+  biceps 2+  biceps 2+  triceps 2+  triceps 2+  patellar 2+  patellar 2+  ankle jerk 2+  ankle jerk 2+  Hoffman no  Hoffman no  plantar response down  plantar response down   SENSORY:  Normal and symmetric perception of light touch, pinprick, vibration, and proprioception.  Romberg's sign absent.   COORDINATION/GAIT: Normal finger-to- nose-finger and heel-to-shin.  Intact rapid alternating movements bilaterally.  Able to rise from a chair without using arms.  Gait narrow based and stable. Tandem and stressed gait intact.    IMPRESSION: Jay Combs is a delightful 59 year-old gentleman presenting for evaluation of right ptosis, headaches, and cognitive changes.  Exam discloses right ptosis which does not worsen with sustained upward gaze.  No evidence of facial muscle weakness or limb weakness. Cranial nerves are  intact.  His cognitive testing did demonstrate difficulty mild deficits in several domains (naming, attention, and recall).  To further evaluation his symptoms, imaging of the brain as well as intra/extracranial vessels will be obtained.  With his ptosis and headache, need to excuse carotid dissection.  Regarding his cognitive changes related to mood disorder/anxiety, especially since he is under a great deal of stress.  Neuropsychological testing may be done going forward, pending the work-up.   PLAN/RECOMMENDATIONS:  1.  Check MRI/A brain, MRA carotids 2.  Check  myasthenia gravis panel, TSH, CMP, vitamin B12 3.  Return to clinic in 4-6 weeks   The duration of this appointment visit was 50 minutes of face-to-face time with the patient.  Greater than 50% of this time was spent in counseling, explanation of diagnosis, planning of further management, and coordination of care.   Thank you for allowing me to participate in patient's care.  If I can answer any additional questions, I would be pleased to do so.    Sincerely,    Donika K. Posey Pronto, DO

## 2014-05-06 LAB — MYASTHENIA GRAVIS PANEL 2
Acetylcholine Rec Binding: <0.3 nmol/L
Acetylcholine Rec Mod Ab: 5 %

## 2014-05-10 ENCOUNTER — Other Ambulatory Visit: Payer: Self-pay | Admitting: Family Medicine

## 2014-05-10 ENCOUNTER — Encounter: Payer: Self-pay | Admitting: Family Medicine

## 2014-05-10 DIAGNOSIS — R05 Cough: Secondary | ICD-10-CM

## 2014-05-10 DIAGNOSIS — R053 Chronic cough: Secondary | ICD-10-CM

## 2014-05-11 ENCOUNTER — Ambulatory Visit (INDEPENDENT_AMBULATORY_CARE_PROVIDER_SITE_OTHER)
Admission: RE | Admit: 2014-05-11 | Discharge: 2014-05-11 | Disposition: A | Discharge status: Home / Self Care | Payer: 59 | Source: Ambulatory Visit | Attending: Family Medicine | Admitting: Family Medicine

## 2014-05-11 DIAGNOSIS — R053 Chronic cough: Secondary | ICD-10-CM

## 2014-05-11 DIAGNOSIS — R05 Cough: Secondary | ICD-10-CM | POA: Diagnosis not present

## 2014-05-13 ENCOUNTER — Ambulatory Visit
Admission: RE | Admit: 2014-05-13 | Discharge: 2014-05-13 | Disposition: A | Discharge status: Home / Self Care | Payer: 59 | Source: Ambulatory Visit | Attending: Neurology | Admitting: Neurology

## 2014-05-13 DIAGNOSIS — R4189 Other symptoms and signs involving cognitive functions and awareness: Secondary | ICD-10-CM

## 2014-05-13 DIAGNOSIS — R4689 Other symptoms and signs involving appearance and behavior: Secondary | ICD-10-CM

## 2014-05-13 DIAGNOSIS — H02401 Unspecified ptosis of right eyelid: Secondary | ICD-10-CM

## 2014-05-13 DIAGNOSIS — R519 Headache, unspecified: Secondary | ICD-10-CM

## 2014-05-13 DIAGNOSIS — R51 Headache: Secondary | ICD-10-CM

## 2014-05-13 MED ORDER — GADOBENATE DIMEGLUMINE 529 MG/ML IV SOLN
14.0000 mL | Freq: Once | INTRAVENOUS | Status: AC | PRN
  Administered 2014-05-13: 14 mL via INTRAVENOUS

## 2014-05-15 ENCOUNTER — Encounter: Payer: Self-pay | Admitting: Family Medicine

## 2014-05-17 ENCOUNTER — Encounter: Payer: Self-pay | Admitting: Neurology

## 2014-05-17 ENCOUNTER — Ambulatory Visit (INDEPENDENT_AMBULATORY_CARE_PROVIDER_SITE_OTHER): Payer: 59 | Admitting: Neurology

## 2014-05-17 VITALS — BP 100/74 | HR 81 | Wt 142.4 lb

## 2014-05-17 DIAGNOSIS — F430 Acute stress reaction: Secondary | ICD-10-CM | POA: Diagnosis not present

## 2014-05-17 DIAGNOSIS — H02401 Unspecified ptosis of right eyelid: Secondary | ICD-10-CM | POA: Diagnosis not present

## 2014-05-17 DIAGNOSIS — R4189 Other symptoms and signs involving cognitive functions and awareness: Secondary | ICD-10-CM

## 2014-05-17 DIAGNOSIS — F919 Conduct disorder, unspecified: Secondary | ICD-10-CM

## 2014-05-17 DIAGNOSIS — R4689 Other symptoms and signs involving appearance and behavior: Secondary | ICD-10-CM

## 2014-05-17 NOTE — Patient Instructions (Addendum)
It was great to see you! If your symptoms worsen, please contact my office so we can discuss additional testing

## 2014-05-17 NOTE — Progress Notes (Signed)
Follow-up Visit   Date: 05/17/2014    [name] MRN: [id number] DOB: [birth year]   Interim History: Jay Combs is a 59 y.o. right-handed Caucasian male with no prior medical history returning to the clinic for follow-up of headaches, right ptosis, and memory changes.  The patient was accompanied to the clinic by self.  History of present illness: Around January 2016, he developed dull pain over the base of his head, lasting about 1-2 hours and occuring daily for several weeks. Advil significantly alleviated his pain. Headache has since resolved and he has not had anything over the past few weeks. He took advil which helped.   He also noticed that the right eyelid droops, which is worse in the morning. No drooping of the face, double vision, dysphagia, dysarthria, jaw fatigue, or limb weakness. No associated numbness/tingling.   He stays active and says that he runs about 4 miles several times per week. In December, he had a very brief spell of imbalance and presyncope after working out. Since then, he has noticed a few things that are abnormal for him; for instance, he walked into the pantry to put the milk and realized what he was doing; on another occasion, he recalls misplacing apple cider vinegar on his counter instead of in the pantry.  On one occasion, he became confused when he was trying to shave and forgot which way to shave.  He reports being under an extreme amount of stress since he last his job in June 2016. He is having difficulty trying to find clients and a job which has forced him to start using his savings. He is ruminating a lot. He was initially not sleeping well, but over the past few months has been able to get more rest. Mood is not that great. He feels angry, bitter, and frustrated. He feels discouraged when he is unable to find work because he is trying to find new clients.   Born in [place] and moved to [place] at the age of 70. He moved to  [place] in 1994.   UPDATE 05/17/2014: No interval change in symptoms, he continues to have right ptosis and intermittent memory difficulties.  He endorses being under a great deal of stress but has now got one client and possibly a second one and is hoping this will help his stress.  He also reports to ruminating a lot at night and gets up focused on the same problem.     Medications:  No current outpatient prescriptions on file prior to visit.   No current facility-administered medications on file prior to visit.    Allergies:  Allergies  Allergen Reactions  . Demerol [Meperidine] Other (See Comments)    Decreased BP  . Meperidine Hcl     REACTION: decreased BP    Review of Systems:  CONSTITUTIONAL: No fevers, chills, night sweats, or weight loss.  EYES: No visual changes or eye pain ENT: No hearing changes.  No history of nose bleeds.   RESPIRATORY: No cough, wheezing and shortness of breath.   CARDIOVASCULAR: Negative for chest pain, and palpitations.   GI: Negative for abdominal discomfort, blood in stools or black stools.  No recent change in bowel habits.   GU:  No history of incontinence.   MUSCLOSKELETAL: No history of joint pain or swelling.  No myalgias.   SKIN: Negative for lesions, rash, and itching.   ENDOCRINE: Negative for cold or heat intolerance, polydipsia or goiter.   PSYCH:  ++ depression or  anxiety symptoms.   NEURO: As Above.   Vital Signs:  BP 100/74 mmHg  Pulse 81  Wt 142 lb 6 oz (64.581 kg)  SpO2 97%  Neurological Exam: MENTAL STATUS including orientation to time, place, person, recent and remote memory, attention span and concentration, language, and fund of knowledge is normal.  Speech is not dysarthric.  CRANIAL NERVES: Pupils equal round and reactive to light.  Normal conjugate, extra-ocular eye movements in all directions of gaze.  Subtle left ptosis (old).  Face is symmetric. Palate elevates symmetrically.  Tongue is midline.  MOTOR:  Motor  strength is 5/5 in all extremities.  No pronator drift.  Tone is normal.    MSRs:  Reflexes are 2+/4 throughout  SENSORY:  Intact to vibration.  COORDINATION/GAIT:  Gait narrow based and stable.  Tandem gait is stable.  Data: MRI/A brain and carotids wwo contrast 05/13/2014: Normal MRI of the brain.  Normal intracranial MR angiography.  Normal MR angiography of the neck vessels.  No cause of the presenting symptoms is identified.  Labs 05/02/2014:  Acetylcholine receptor binding, blocking, and modulating antibody negative, TSH 2.460, vitamin B12 514   IMPRESSION/PLAN: Jay Combs is a delightful 59 year-old gentleman presenting for evaluation of right ptosis and cognitive changes.  I reviewed this work-up which includes MRI/A brain and MRA carotids as well as testing for myasthenia antibodies, vitamin B12 deficiency, and thyroid disease and has all returned normal.His cognitive changes are most likely due to underlying stress and/or anxiety and encouraged him to consider seeing a therapist for stress management/coping mechanisms.  Formal neuropsychological testing was deferred.   Regarding his right ptosis, I offered to perform EMG with repetitive nerve stimulation to look for a neuromuscular junction disorder, however patient would like to hold off on it for now.  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   He may benefit from antidepressant/anxiolytic as well as seeing a counselor, which he would like think about.  He seems reassured that there is nothing worrisome and symptoms may all be stemming from underlying stress so would like to see how he does over the next few months.  Return to clinic as needed.    The duration of this appointment visit was 25 minutes of face-to-face time with the patient.  Greater than 50% of this time was spent in counseling, explanation of diagnosis, planning of further  management, and coordination of care.   Thank you for allowing me to participate in patient's care.  If I can answer any additional questions, I would be pleased to do so.    Sincerely,    Donika K. Posey Pronto, DO

## 2014-07-17 ENCOUNTER — Other Ambulatory Visit (INDEPENDENT_AMBULATORY_CARE_PROVIDER_SITE_OTHER): Payer: 59

## 2014-07-17 DIAGNOSIS — Z0000 Encounter for general adult medical examination without abnormal findings: Secondary | ICD-10-CM | POA: Diagnosis not present

## 2014-07-17 LAB — HEPATIC FUNCTION PANEL
ALBUMIN: 4.3 g/dL (ref 3.5–5.2)
ALT: 11 U/L (ref 0–53)
AST: 17 U/L (ref 0–37)
Alkaline Phosphatase: 47 U/L (ref 39–117)
BILIRUBIN TOTAL: 0.9 mg/dL (ref 0.2–1.2)
Bilirubin, Direct: 0.2 mg/dL (ref 0.0–0.3)
Total Protein: 6.5 g/dL (ref 6.0–8.3)

## 2014-07-17 LAB — CBC WITH DIFFERENTIAL/PLATELET
Basophils Absolute: 0 10*3/uL (ref 0.0–0.1)
Basophils Relative: 0.6 % (ref 0.0–3.0)
EOS ABS: 0.1 10*3/uL (ref 0.0–0.7)
Eosinophils Relative: 2.5 % (ref 0.0–5.0)
HEMATOCRIT: 41.4 % (ref 39.0–52.0)
Hemoglobin: 13.9 g/dL (ref 13.0–17.0)
Lymphocytes Relative: 40.5 % (ref 12.0–46.0)
Lymphs Abs: 2.2 10*3/uL (ref 0.7–4.0)
MCHC: 33.7 g/dL (ref 30.0–36.0)
MCV: 90.1 fl (ref 78.0–100.0)
MONO ABS: 0.6 10*3/uL (ref 0.1–1.0)
Monocytes Relative: 11.8 % (ref 3.0–12.0)
NEUTROS ABS: 2.4 10*3/uL (ref 1.4–7.7)
Neutrophils Relative %: 44.6 % (ref 43.0–77.0)
Platelets: 182 10*3/uL (ref 150.0–400.0)
RBC: 4.6 Mil/uL (ref 4.22–5.81)
RDW: 13.9 % (ref 11.5–15.5)
WBC: 5.3 10*3/uL (ref 4.0–10.5)

## 2014-07-17 LAB — POCT URINALYSIS DIPSTICK
Bilirubin, UA: NEGATIVE
Blood, UA: NEGATIVE
Glucose, UA: NEGATIVE
Ketones, UA: NEGATIVE
LEUKOCYTES UA: NEGATIVE
Nitrite, UA: NEGATIVE
Protein, UA: NEGATIVE
Spec Grav, UA: <=1.005
Urobilinogen, UA: 0.2
pH, UA: 5.5

## 2014-07-17 LAB — LIPID PANEL
CHOLESTEROL: 190 mg/dL (ref 0–200)
HDL: 71.2 mg/dL (ref >39.00)
LDL CALC: 108 mg/dL — AB (ref 0–99)
NonHDL: 118.8
Total CHOL/HDL Ratio: 3
Triglycerides: 52 mg/dL (ref 0.0–149.0)
VLDL: 10.4 mg/dL (ref 0.0–40.0)

## 2014-07-17 LAB — PSA: PSA: 1.8 ng/mL (ref 0.10–4.00)

## 2014-07-17 LAB — BASIC METABOLIC PANEL
BUN: 16 mg/dL (ref 6–23)
CALCIUM: 9.2 mg/dL (ref 8.4–10.5)
CO2: 28 meq/L (ref 19–32)
CREATININE: 0.91 mg/dL (ref 0.40–1.50)
Chloride: 104 mEq/L (ref 96–112)
GFR: 91.32 mL/min (ref >60.00)
GLUCOSE: 88 mg/dL (ref 70–99)
POTASSIUM: 5.4 meq/L — AB (ref 3.5–5.1)
SODIUM: 136 meq/L (ref 135–145)

## 2014-07-17 LAB — TSH: TSH: 1.12 u[IU]/mL (ref 0.35–4.50)

## 2014-07-17 NOTE — Addendum Note (Signed)
Addended by: Elmer Picker on: 07/17/2014 09:37 AM   Modules accepted: Orders

## 2014-07-17 NOTE — Addendum Note (Signed)
Addended by: Elmer Picker on: 07/17/2014 09:42 AM   Modules accepted: Orders

## 2014-07-21 ENCOUNTER — Ambulatory Visit (INDEPENDENT_AMBULATORY_CARE_PROVIDER_SITE_OTHER): Payer: 59 | Admitting: Family Medicine

## 2014-07-21 ENCOUNTER — Encounter: Payer: Self-pay | Admitting: Family Medicine

## 2014-07-21 VITALS — BP 108/60 | HR 73 | Temp 97.8°F | Ht 65.0 in | Wt 141.3 lb

## 2014-07-21 DIAGNOSIS — Z0000 Encounter for general adult medical examination without abnormal findings: Secondary | ICD-10-CM | POA: Diagnosis not present

## 2014-07-21 DIAGNOSIS — R972 Elevated prostate specific antigen [PSA]: Secondary | ICD-10-CM

## 2014-07-21 NOTE — Patient Instructions (Signed)
Let's plan to repeat PSA in about 4 months.

## 2014-07-21 NOTE — Progress Notes (Signed)
Subjective:    Patient ID: Jay Combs, male    DOB: 11-01-1955, 59 y.o.   MRN: [id number]  HPI Patient is seen for complete physical. Generally very healthy. He exercises about 4 days per week. 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 That has been slow getting up and going. Immunizations reviewed. Tetanus still has one more year before running out. Colonoscopy up-to-date. Nonsmoker.  Reviewed with no changes  Past Medical History  Diagnosis Date  . INSOMNIA, CHRONIC 08/09/2008  . NONSUPPRATV OTITIS MEDIA NOT SPEC AS ACUT/CHRON 06/16/2008  . EUSTACHIAN TUBE DYSFUNCTION, BILATERAL 06/05/2008  . BRADYCARDIA 09/25/2006  . Acute maxillary sinusitis 05/22/2009  . BRONCHITIS, ACUTE WITH MILD BRONCHOSPASM 12/13/2007  . ALLERGIC RHINITIS 09/25/2006  . PALPITATIONS, CHRONIC 10/28/2007  . SEPTIC/HYPOVOLEMIC SHOCK 01/14/2008  . CHEST PAIN UNSPECIFIED 10/28/2007   Past Surgical History  Procedure Laterality Date  . Nasal septum surgery      reports that he has never smoked. He has never used smokeless tobacco. He reports that he drinks about 3.0 oz of alcohol per week. He reports that he does not use illicit drugs. family history includes Anxiety disorder in his other; Aortic aneurysm in his mother; Healthy in his father, sister, and son; Heart disease in his other; Hypertension in his other; Other in his son; Uterine cancer in his mother. Allergies  Allergen Reactions  . Demerol [Meperidine] Other (See Comments)    Decreased BP  . Meperidine Hcl     REACTION: decreased BP      Review of Systems  Constitutional: Negative for fever, activity change, appetite change, fatigue and unexpected weight change.  HENT: Negative for congestion, ear pain and trouble swallowing.   Eyes: Negative for pain and visual disturbance.  Respiratory: Negative for cough, shortness of breath and wheezing.   Cardiovascular: Negative for chest pain and palpitations.    Gastrointestinal: Negative for nausea, vomiting, abdominal pain, diarrhea, constipation, blood in stool, abdominal distention and rectal pain.  Genitourinary: Negative for dysuria, hematuria and testicular pain.  Musculoskeletal: Negative for joint swelling and arthralgias.  Skin: Negative for rash.  Neurological: Negative for dizziness, syncope and headaches.  Hematological: Negative for adenopathy.  Psychiatric/Behavioral: Positive for sleep disturbance. Negative for confusion and dysphoric mood.       Objective:   Physical Exam  Constitutional: He is oriented to person, place, and time. He appears well-developed and well-nourished. No distress.  HENT:  Head: Normocephalic and atraumatic.  Right Ear: External ear normal.  Left Ear: External ear normal.  Mouth/Throat: Oropharynx is clear and moist.  Eyes: Conjunctivae and EOM are normal. Pupils are equal, round, and reactive to light.  Neck: Normal range of motion. Neck supple. No thyromegaly present.  Cardiovascular: Normal rate, regular rhythm and normal heart sounds.   No murmur heard. Pulmonary/Chest: No respiratory distress. He has no wheezes. He has no rales.  Abdominal: Soft. Bowel sounds are normal. He exhibits no distension and no mass. There is no tenderness. There is no rebound and no guarding.  Genitourinary: Rectum normal and prostate normal.  Musculoskeletal: He exhibits no edema.  Lymphadenopathy:    He has no cervical adenopathy.  Neurological: He is alert and oriented to person, place, and time. He displays normal reflexes. No cranial nerve deficit.  Skin: No rash noted.  Psychiatric: He has a normal mood and affect.          Assessment & Plan:  Complete physical. Labs reviewed. PSA 1.8 and  this compares with 0.69 last year. Unremarkable exam. Repeat PSA in 4 months. If climbing at that point, consider urology referral. Continue regular exercise habits. Tetanus booster by next year.

## 2014-07-21 NOTE — Progress Notes (Signed)
Pre visit review using our clinic review tool, if applicable. No additional management support is needed unless otherwise documented below in the visit note. 

## 2014-09-22 ENCOUNTER — Ambulatory Visit: Payer: 59 | Admitting: Family Medicine

## 2014-09-25 ENCOUNTER — Ambulatory Visit: Payer: 59 | Admitting: Family Medicine

## 2014-12-08 ENCOUNTER — Ambulatory Visit (INDEPENDENT_AMBULATORY_CARE_PROVIDER_SITE_OTHER): Payer: 59

## 2014-12-08 ENCOUNTER — Encounter: Payer: Self-pay | Admitting: Family Medicine

## 2014-12-08 ENCOUNTER — Other Ambulatory Visit (INDEPENDENT_AMBULATORY_CARE_PROVIDER_SITE_OTHER): Payer: 59

## 2014-12-08 ENCOUNTER — Other Ambulatory Visit: Payer: 59 | Admitting: Family Medicine

## 2014-12-08 DIAGNOSIS — R972 Elevated prostate specific antigen [PSA]: Secondary | ICD-10-CM

## 2014-12-08 DIAGNOSIS — Z23 Encounter for immunization: Secondary | ICD-10-CM

## 2014-12-08 LAB — PSA: PSA: 2.13 ng/mL (ref 0.10–4.00)

## 2014-12-11 NOTE — Telephone Encounter (Signed)
Pt returned your call. Please call on his cell [phone]

## 2014-12-14 NOTE — Telephone Encounter (Signed)
Left message for pt to return call. I have spoke with Tommi Rumps (another provider in our office) in dr. Anastasio Auerbach absence. Your PSA has increased but it is not in the worrisome range. Recommendations are to continue to monitor yearly.

## 2014-12-14 NOTE — Telephone Encounter (Signed)
Left message for pt to return call. I have spoke with [facility] in dr. Anastasio Auerbach absence. His PSA has increased but it is not in the worrisome range. Recommendations are to continue to monitor yearly.

## 2015-01-03 ENCOUNTER — Other Ambulatory Visit: Payer: Self-pay | Admitting: Gastroenterology

## 2015-01-08 ENCOUNTER — Other Ambulatory Visit: Payer: Self-pay | Admitting: Gastroenterology

## 2015-01-10 ENCOUNTER — Other Ambulatory Visit: Payer: Self-pay

## 2015-01-10 ENCOUNTER — Telehealth: Payer: Self-pay | Admitting: Gastroenterology

## 2015-01-10 ENCOUNTER — Encounter: Payer: Self-pay | Admitting: Gastroenterology

## 2015-01-10 MED ORDER — DEXLANSOPRAZOLE 60 MG PO CPDR: 60.0000 mg | DELAYED_RELEASE_CAPSULE | Freq: Every day | ORAL | Status: DC

## 2015-01-10 NOTE — Telephone Encounter (Signed)
Prescription sent to patient's pharmacy.

## 2015-01-17 ENCOUNTER — Encounter: Payer: Self-pay | Admitting: Family Medicine

## 2015-01-19 ENCOUNTER — Telehealth: Payer: Self-pay | Admitting: Family Medicine

## 2015-01-19 NOTE — Telephone Encounter (Signed)
[name] has an appt at [facility] on 12.14.16 and called to see if it's been approved and authorized by his insurance carrier. He'd like a phone call regarding this.   Pt's ph# [phone] Thank you.

## 2015-01-22 NOTE — Telephone Encounter (Signed)
Pt needed a a referral to GI not endocrinology  Referral done

## 2015-01-24 ENCOUNTER — Ambulatory Visit (INDEPENDENT_AMBULATORY_CARE_PROVIDER_SITE_OTHER): Payer: 59 | Admitting: Gastroenterology

## 2015-01-24 ENCOUNTER — Encounter: Payer: Self-pay | Admitting: Gastroenterology

## 2015-01-24 VITALS — BP 98/64 | HR 56 | Ht 66.0 in | Wt 145.0 lb

## 2015-01-24 DIAGNOSIS — K219 Gastro-esophageal reflux disease without esophagitis: Secondary | ICD-10-CM | POA: Diagnosis not present

## 2015-01-24 MED ORDER — DEXLANSOPRAZOLE 60 MG PO CPDR: 60.0000 mg | DELAYED_RELEASE_CAPSULE | Freq: Every day | ORAL | Status: DC

## 2015-01-24 NOTE — Progress Notes (Signed)
    History of Present Illness: This is a 60 year old male with chronic GERD. His symptoms have been well-controlled on Dexilant in the past. EGD performed in April 2014 was normal. He's been off medication for several months with no symptoms until about one month ago. He is not sure what triggered his symptoms but perhaps an increase in coffee intake recently.  Current Medications, Allergies, Past Medical History, Past Surgical History, Family History and Social History were reviewed in [employer] record.  Physical Exam: General: Well developed, well nourished, no acute distress Head: Normocephalic and atraumatic Eyes:  sclerae anicteric, EOMI Ears: Normal auditory acuity Mouth: No deformity or lesions Lungs: Clear throughout to auscultation Heart: Regular rate and rhythm; no murmurs, rubs or bruits Abdomen: Soft, non tender and non distended. No masses, hepatosplenomegaly or hernias noted. Normal Bowel sounds Musculoskeletal: Symmetrical with no gross deformities  Pulses:  Normal pulses noted Extremities: No clubbing, cyanosis, edema or deformities noted Neurological: Alert oriented x 4, grossly nonfocal Psychological:  Alert and cooperative. Normal mood and affect  Assessment and Recommendations:  1. GERD. Follow standard antireflux measures. Dexilant 60 mg daily. Can try OTC Nexium or OTC Prilosec daily in the future after his symptoms are under good control with Dexilant. Another option would be generic omeprazole 20 or 40 mg daily  2. CRC screening, average risk. 10 year interval screening colonoscopy recommended in September 2020.

## 2015-01-24 NOTE — Patient Instructions (Signed)
We have sent the following medications to your pharmacy for you to pick up at your convenience:Dexilant,  Thank you for choosing me and [facility].  [name]. [name]., MD., Marval Regal

## 2015-04-03 ENCOUNTER — Encounter: Payer: Self-pay | Admitting: Gastroenterology

## 2015-05-25 ENCOUNTER — Other Ambulatory Visit: Payer: 59

## 2015-05-30 ENCOUNTER — Encounter: Payer: Self-pay | Admitting: Family Medicine

## 2015-05-30 ENCOUNTER — Other Ambulatory Visit (INDEPENDENT_AMBULATORY_CARE_PROVIDER_SITE_OTHER): Payer: No Typology Code available for payment source

## 2015-05-30 DIAGNOSIS — Z125 Encounter for screening for malignant neoplasm of prostate: Secondary | ICD-10-CM

## 2015-05-30 DIAGNOSIS — R972 Elevated prostate specific antigen [PSA]: Secondary | ICD-10-CM

## 2015-05-30 LAB — PSA: PSA: 1.73 ng/mL (ref 0.10–4.00)

## 2015-05-31 NOTE — Telephone Encounter (Signed)
Pt just wants to have PSA rechecked and not do at CPE when he is due. When does he need PSA rechecked, since it was just done yesterday?

## 2015-07-25 ENCOUNTER — Ambulatory Visit (INDEPENDENT_AMBULATORY_CARE_PROVIDER_SITE_OTHER): Payer: No Typology Code available for payment source | Admitting: Family Medicine

## 2015-07-25 VITALS — BP 100/70 | HR 81 | Temp 98.2°F | Ht 66.0 in | Wt 143.0 lb

## 2015-07-25 DIAGNOSIS — M6740 Ganglion, unspecified site: Secondary | ICD-10-CM | POA: Diagnosis not present

## 2015-07-25 NOTE — Patient Instructions (Signed)
Ganglion Cyst  A ganglion cyst is a noncancerous, fluid-filled lump that occurs near joints or tendons. The ganglion cyst grows out of a joint or the lining of a tendon. It most often develops in the hand or wrist, but it can also develop in the shoulder, elbow, hip, knee, ankle, or foot. The round or oval ganglion cyst can be the size of a pea or larger than a grape. Increased activity may enlarge the size of the cyst because more fluid starts to build up.   CAUSES  It is not known what causes a ganglion cyst to grow. However, it may be related to:  · Inflammation or irritation around the joint.  · An injury.  · Repetitive movements or overuse.  · Arthritis.  RISK FACTORS  Risk factors include:  · Being a woman.  · Being age 20-50.  SIGNS AND SYMPTOMS  Symptoms may include:   · A lump. This most often appears on the hand or wrist, but it can occur in other areas of the body.  · Tingling.  · Pain.  · Numbness.  · Muscle weakness.  · Weak grip.  · Less movement in a joint.  DIAGNOSIS  Ganglion cysts are most often diagnosed based on a physical exam. Your health care provider will feel the lump and may shine a light alongside it. If it is a ganglion cyst, a light often shines through it. Your health care provider may order an X-ray, ultrasound, or MRI to rule out other conditions.  TREATMENT  Ganglion cysts usually go away on their own without treatment. If pain or other symptoms are involved, treatment may be needed. Treatment is also needed if the ganglion cyst limits your movement or if it gets infected. Treatment may include:  · Wearing a brace or splint on your wrist or finger.  · Taking anti-inflammatory medicine.  · Draining fluid from the lump with a needle (aspiration).  · Injecting a steroid into the joint.  · Surgery to remove the ganglion cyst.  HOME CARE INSTRUCTIONS  · Do not press on the ganglion cyst, poke it with a needle, or hit it.  · Take medicines only as directed by your health care  provider.  · Wear your brace or splint as directed by your health care provider.  · Watch your ganglion cyst for any changes.  · Keep all follow-up visits as directed by your health care provider. This is important.  SEEK MEDICAL CARE IF:  · Your ganglion cyst becomes larger or more painful.  · You have increased redness, red streaks, or swelling.  · You have pus coming from the lump.  · You have weakness or numbness in the affected area.  · You have a fever or chills.     This information is not intended to replace advice given to you by your health care provider. Make sure you discuss any questions you have with your health care provider.     Document Released: 01/25/2000 Document Revised: 02/17/2014 Document Reviewed: 07/12/2013  Elsevier Interactive Patient Education ©2016 Elsevier Inc.

## 2015-07-25 NOTE — Progress Notes (Signed)
Pre visit review using our clinic review tool, if applicable. No additional management support is needed unless otherwise documented below in the visit note. 

## 2015-07-25 NOTE — Progress Notes (Signed)
   Subjective:    Patient ID: Jay Combs, male    DOB: 1955/05/08, 60 y.o.   MRN: [id number]  HPI Patient seen with small nodular growth right hand He just noted this about 2 weeks ago He is right-hand dominant. No injury. Minimal sensitivity but no significant pain. Prior history of ganglion cyst and states this is very similar.  Past Medical History  Diagnosis Date  . INSOMNIA, CHRONIC 08/09/2008  . NONSUPPRATV OTITIS MEDIA NOT SPEC AS ACUT/CHRON 06/16/2008  . EUSTACHIAN TUBE DYSFUNCTION, BILATERAL 06/05/2008  . BRADYCARDIA 09/25/2006  . Acute maxillary sinusitis 05/22/2009  . BRONCHITIS, ACUTE WITH MILD BRONCHOSPASM 12/13/2007  . ALLERGIC RHINITIS 09/25/2006  . PALPITATIONS, CHRONIC 10/28/2007  . SEPTIC/HYPOVOLEMIC SHOCK 01/14/2008  . CHEST PAIN UNSPECIFIED 10/28/2007   Past Surgical History  Procedure Laterality Date  . Nasal septum surgery      reports that he has never smoked. He has never used smokeless tobacco. He reports that he drinks about 3.0 oz of alcohol per week. He reports that he does not use illicit drugs. family history includes Anxiety disorder in his other; Aortic aneurysm in his mother; Healthy in his father, sister, and son; Heart disease in his other; Hypertension in his other; Other in his son; Uterine cancer in his mother. Allergies  Allergen Reactions  . Demerol [Meperidine] Other (See Comments)    Decreased BP  . Meperidine Hcl     REACTION: decreased BP      Review of Systems  Constitutional: Negative for appetite change and unexpected weight change.  Hematological: Negative for adenopathy.       Objective:   Physical Exam  Constitutional: He appears well-developed and well-nourished.  Cardiovascular: Normal rate and regular rhythm.   Pulmonary/Chest: Effort normal and breath sounds normal. No respiratory distress. He has no wheezes. He has no rales.  Musculoskeletal:  Right hand just proximal to the fifth digit volar surface one half centimeter  rounded mobile cystic lesion. Nontender. Full range of motion right fifth digit          Assessment & Plan:  Suspected ganglion cyst flexor tendon right fifth digit. Reassurance. Follow-up for any rapid growth, pain, or other concerns.  Eulas Post MD [facility]

## 2015-10-08 ENCOUNTER — Other Ambulatory Visit (INDEPENDENT_AMBULATORY_CARE_PROVIDER_SITE_OTHER): Payer: No Typology Code available for payment source

## 2015-10-08 DIAGNOSIS — Z0000 Encounter for general adult medical examination without abnormal findings: Secondary | ICD-10-CM

## 2015-10-08 LAB — BASIC METABOLIC PANEL
BUN: 14 mg/dL (ref 6–23)
CHLORIDE: 102 meq/L (ref 96–112)
CO2: 29 meq/L (ref 19–32)
Calcium: 8.9 mg/dL (ref 8.4–10.5)
Creatinine, Ser: 0.93 mg/dL (ref 0.40–1.50)
GFR: 88.67 mL/min (ref >60.00)
GLUCOSE: 94 mg/dL (ref 70–99)
POTASSIUM: 4.3 meq/L (ref 3.5–5.1)
Sodium: 137 mEq/L (ref 135–145)

## 2015-10-08 LAB — LIPID PANEL
CHOLESTEROL: 211 mg/dL — AB (ref 0–200)
HDL: 68.9 mg/dL (ref >39.00)
LDL CALC: 130 mg/dL — AB (ref 0–99)
NonHDL: 142.27
TRIGLYCERIDES: 62 mg/dL (ref 0.0–149.0)
Total CHOL/HDL Ratio: 3
VLDL: 12.4 mg/dL (ref 0.0–40.0)

## 2015-10-08 LAB — HEPATIC FUNCTION PANEL
ALBUMIN: 4.1 g/dL (ref 3.5–5.2)
ALT: 10 U/L (ref 0–53)
AST: 17 U/L (ref 0–37)
Alkaline Phosphatase: 42 U/L (ref 39–117)
BILIRUBIN TOTAL: 0.7 mg/dL (ref 0.2–1.2)
Bilirubin, Direct: 0.1 mg/dL (ref 0.0–0.3)
Total Protein: 6.5 g/dL (ref 6.0–8.3)

## 2015-10-08 LAB — CBC WITH DIFFERENTIAL/PLATELET
BASOS PCT: 0.7 % (ref 0.0–3.0)
Basophils Absolute: 0 10*3/uL (ref 0.0–0.1)
EOS PCT: 3.4 % (ref 0.0–5.0)
Eosinophils Absolute: 0.2 10*3/uL (ref 0.0–0.7)
HCT: 42.7 % (ref 39.0–52.0)
HEMOGLOBIN: 14.5 g/dL (ref 13.0–17.0)
LYMPHS ABS: 2.1 10*3/uL (ref 0.7–4.0)
Lymphocytes Relative: 39.3 % (ref 12.0–46.0)
MCHC: 34 g/dL (ref 30.0–36.0)
MCV: 90 fl (ref 78.0–100.0)
MONOS PCT: 10.4 % (ref 3.0–12.0)
Monocytes Absolute: 0.5 10*3/uL (ref 0.1–1.0)
Neutro Abs: 2.4 10*3/uL (ref 1.4–7.7)
Neutrophils Relative %: 46.2 % (ref 43.0–77.0)
Platelets: 182 10*3/uL (ref 150.0–400.0)
RBC: 4.74 Mil/uL (ref 4.22–5.81)
RDW: 13.6 % (ref 11.5–15.5)
WBC: 5.2 10*3/uL (ref 4.0–10.5)

## 2015-10-08 LAB — PSA: PSA: 1.16 ng/mL (ref 0.10–4.00)

## 2015-10-08 LAB — TSH: TSH: 1.4 u[IU]/mL (ref 0.35–4.50)

## 2015-10-16 ENCOUNTER — Encounter: Payer: Self-pay | Admitting: Family Medicine

## 2015-10-16 ENCOUNTER — Ambulatory Visit (INDEPENDENT_AMBULATORY_CARE_PROVIDER_SITE_OTHER): Payer: No Typology Code available for payment source | Admitting: Family Medicine

## 2015-10-16 VITALS — BP 110/80 | HR 81 | Temp 97.9°F | Ht 64.75 in | Wt 144.0 lb

## 2015-10-16 DIAGNOSIS — Z23 Encounter for immunization: Secondary | ICD-10-CM | POA: Diagnosis not present

## 2015-10-16 DIAGNOSIS — Z299 Encounter for prophylactic measures, unspecified: Secondary | ICD-10-CM

## 2015-10-16 DIAGNOSIS — Z0000 Encounter for general adult medical examination without abnormal findings: Secondary | ICD-10-CM

## 2015-10-16 DIAGNOSIS — Z418 Encounter for other procedures for purposes other than remedying health state: Secondary | ICD-10-CM | POA: Diagnosis not present

## 2015-10-16 MED ORDER — TEMAZEPAM 15 MG PO CAPS: 15.0000 mg | ORAL_CAPSULE | Freq: Every evening | ORAL | 0 refills | Status: DC | PRN

## 2015-10-16 NOTE — Progress Notes (Signed)
Pre visit review using our clinic review tool, if applicable. No additional management support is needed unless otherwise documented below in the visit note. 

## 2015-10-16 NOTE — Progress Notes (Signed)
Subjective:     Patient ID: Camdan Floresca, male   DOB: 02/21/1955, 60 y.o.   MRN: [id number]  HPI Here for physical exam. Very stressful year. His mother was just diagnosed with breast cancer and father diagnosed with lung cancer within the past few months. 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 He has increased lethargy. Good appetite. No major weight changes. Poor sleep quality. Difficulty falling asleep and staying asleep. Not exercising as much as used to.  Immunizations reviewed. Needs tetanus booster and also needs flu vaccine. No indications for Pneumovax yet. Colonoscopy up-to-date.  Past Medical History:  Diagnosis Date  . Acute maxillary sinusitis 05/22/2009  . ALLERGIC RHINITIS 09/25/2006  . BRADYCARDIA 09/25/2006  . BRONCHITIS, ACUTE WITH MILD BRONCHOSPASM 12/13/2007  . CHEST PAIN UNSPECIFIED 10/28/2007  . EUSTACHIAN TUBE DYSFUNCTION, BILATERAL 06/05/2008  . INSOMNIA, CHRONIC 08/09/2008  . NONSUPPRATV OTITIS MEDIA NOT SPEC AS ACUT/CHRON 06/16/2008  . PALPITATIONS, CHRONIC 10/28/2007  . SEPTIC/HYPOVOLEMIC SHOCK 01/14/2008   Past Surgical History:  Procedure Laterality Date  . NASAL SEPTUM SURGERY      reports that he has never smoked. He has never used smokeless tobacco. He reports that he drinks about 3.0 oz of alcohol per week . He reports that he does not use drugs. family history includes Anxiety disorder in his other; Aortic aneurysm in his mother; Healthy in his father, sister, and son; Heart disease in his other; Hypertension in his other; Other in his son; Uterine cancer in his mother. Allergies  Allergen Reactions  . Demerol [Meperidine] Other (See Comments)    Decreased BP  . Meperidine Hcl     REACTION: decreased BP     Review of Systems  Constitutional: Positive for fatigue. Negative for activity change, appetite change and fever.  HENT: Negative for congestion, ear pain and trouble swallowing.   Eyes: Negative for pain and  visual disturbance.  Respiratory: Negative for cough, shortness of breath and wheezing.   Cardiovascular: Negative for chest pain and palpitations.  Gastrointestinal: Negative for abdominal distention, abdominal pain, blood in stool, constipation, diarrhea, nausea, rectal pain and vomiting.  Genitourinary: Negative for dysuria, hematuria and testicular pain.  Musculoskeletal: Negative for arthralgias and joint swelling.  Skin: Negative for rash.  Neurological: Negative for dizziness, syncope and headaches.  Hematological: Negative for adenopathy.  Psychiatric/Behavioral: Positive for sleep disturbance. Negative for confusion and dysphoric mood.       Objective:   Physical Exam  Constitutional: He is oriented to person, place, and time. He appears well-developed and well-nourished. No distress.  HENT:  Head: Normocephalic and atraumatic.  Right Ear: External ear normal.  Left Ear: External ear normal.  Mouth/Throat: Oropharynx is clear and moist.  Eyes: Conjunctivae and EOM are normal. Pupils are equal, round, and reactive to light.  Neck: Normal range of motion. Neck supple. No thyromegaly present.  Cardiovascular: Normal rate, regular rhythm and normal heart sounds.   No murmur heard. Pulmonary/Chest: No respiratory distress. He has no wheezes. He has no rales.  Abdominal: Soft. Bowel sounds are normal. He exhibits no distension and no mass. There is no tenderness. There is no rebound and no guarding.  Musculoskeletal: He exhibits no edema.  Lymphadenopathy:    He has no cervical adenopathy.  Neurological: He is alert and oriented to person, place, and time. He displays normal reflexes. No cranial nerve deficit.  Skin: No rash noted.  Psychiatric: He has a normal mood and affect.  Assessment:     Physical exam. Labs reviewed with no major concerns.   Poor sleep quality    Plan:     -Sleep hygiene discussed with handout given -Labs reviewed with patient -Increase  exercise -Tetanus booster and flu vaccine given -Short-term only use of Restoril   15 mg daily at bedtime when necessary for severe insomnia  Eulas Post MD [facility]

## 2015-10-16 NOTE — Patient Instructions (Signed)
Insomnia Insomnia is a sleep disorder that makes it difficult to fall asleep or to stay asleep. Insomnia can cause tiredness (fatigue), low energy, difficulty concentrating, mood swings, and poor performance at work or school.  There are three different ways to classify insomnia:  Difficulty falling asleep.  Difficulty staying asleep.  Waking up too early in the morning. Any type of insomnia can be long-term (chronic) or short-term (acute). Both are common. Short-term insomnia usually lasts for three months or less. Chronic insomnia occurs at least three times a week for longer than three months. CAUSES  Insomnia may be caused by another condition, situation, or substance, such as:  Anxiety.  Certain medicines.  Gastroesophageal reflux disease (GERD) or other gastrointestinal conditions.  Asthma or other breathing conditions.  Restless legs syndrome, sleep apnea, or other sleep disorders.  Chronic pain.  Menopause. This may include hot flashes.  Stroke.  Abuse of alcohol, tobacco, or illegal drugs.  Depression.  Caffeine.   Neurological disorders, such as Alzheimer disease.  An overactive thyroid (hyperthyroidism). The cause of insomnia may not be known. RISK FACTORS Risk factors for insomnia include:  Gender. Women are more commonly affected than men.  Age. Insomnia is more common as you get older.  Stress. This may involve your professional or personal life.  Income. Insomnia is more common in people with lower income.  Lack of exercise.   Irregular work schedule or night shifts.  Traveling between different time zones. SIGNS AND SYMPTOMS If you have insomnia, trouble falling asleep or trouble staying asleep is the main symptom. This may lead to other symptoms, such as:  Feeling fatigued.  Feeling nervous about going to sleep.  Not feeling rested in the morning.  Having trouble concentrating.  Feeling irritable, anxious, or depressed. TREATMENT   Treatment for insomnia depends on the cause. If your insomnia is caused by an underlying condition, treatment will focus on addressing the condition. Treatment may also include:   Medicines to help you sleep.  Counseling or therapy.  Lifestyle adjustments. HOME CARE INSTRUCTIONS   Take medicines only as directed by your health care provider.  Keep regular sleeping and waking hours. Avoid naps.  Keep a sleep diary to help you and your health care provider figure out what could be causing your insomnia. Include:   When you sleep.  When you wake up during the night.  How well you sleep.   How rested you feel the next day.  Any side effects of medicines you are taking.  What you eat and drink.   Make your bedroom a comfortable place where it is easy to fall asleep:  Put up shades or special blackout curtains to block light from outside.  Use a white noise machine to block noise.  Keep the temperature cool.   Exercise regularly as directed by your health care provider. Avoid exercising right before bedtime.  Use relaxation techniques to manage stress. Ask your health care provider to suggest some techniques that may work well for you. These may include:  Breathing exercises.  Routines to release muscle tension.  Visualizing peaceful scenes.  Cut back on alcohol, caffeinated beverages, and cigarettes, especially close to bedtime. These can disrupt your sleep.  Do not overeat or eat spicy foods right before bedtime. This can lead to digestive discomfort that can make it hard for you to sleep.  Limit screen use before bedtime. This includes:  Watching TV.  Using your smartphone, tablet, and computer.  Stick to a routine. This   can help you fall asleep faster. Try to do a quiet activity, brush your teeth, and go to bed at the same time each night.  Get out of bed if you are still awake after 15 minutes of trying to sleep. Keep the lights down, but try reading or  doing a quiet activity. When you feel sleepy, go back to bed.  Make sure that you drive carefully. Avoid driving if you feel very sleepy.  Keep all follow-up appointments as directed by your health care provider. This is important. SEEK MEDICAL CARE IF:   You are tired throughout the day or have trouble in your daily routine due to sleepiness.  You continue to have sleep problems or your sleep problems get worse. SEEK IMMEDIATE MEDICAL CARE IF:   You have serious thoughts about hurting yourself or someone else.   This information is not intended to replace advice given to you by your health care provider. Make sure you discuss any questions you have with your health care provider.   Document Released: 01/25/2000 Document Revised: 10/18/2014 Document Reviewed: 10/28/2013 Elsevier Interactive Patient Education 2016 Elsevier Inc.  

## 2016-01-20 IMAGING — CR DG CHEST 2V
2 series · 2 of 2 positions shown · non-contrast
Comparison: None.

CLINICAL DATA: Persistent dry cough for 6 weeks.

EXAM:
CHEST  2 VIEW

[view not recorded (1 of 2)]
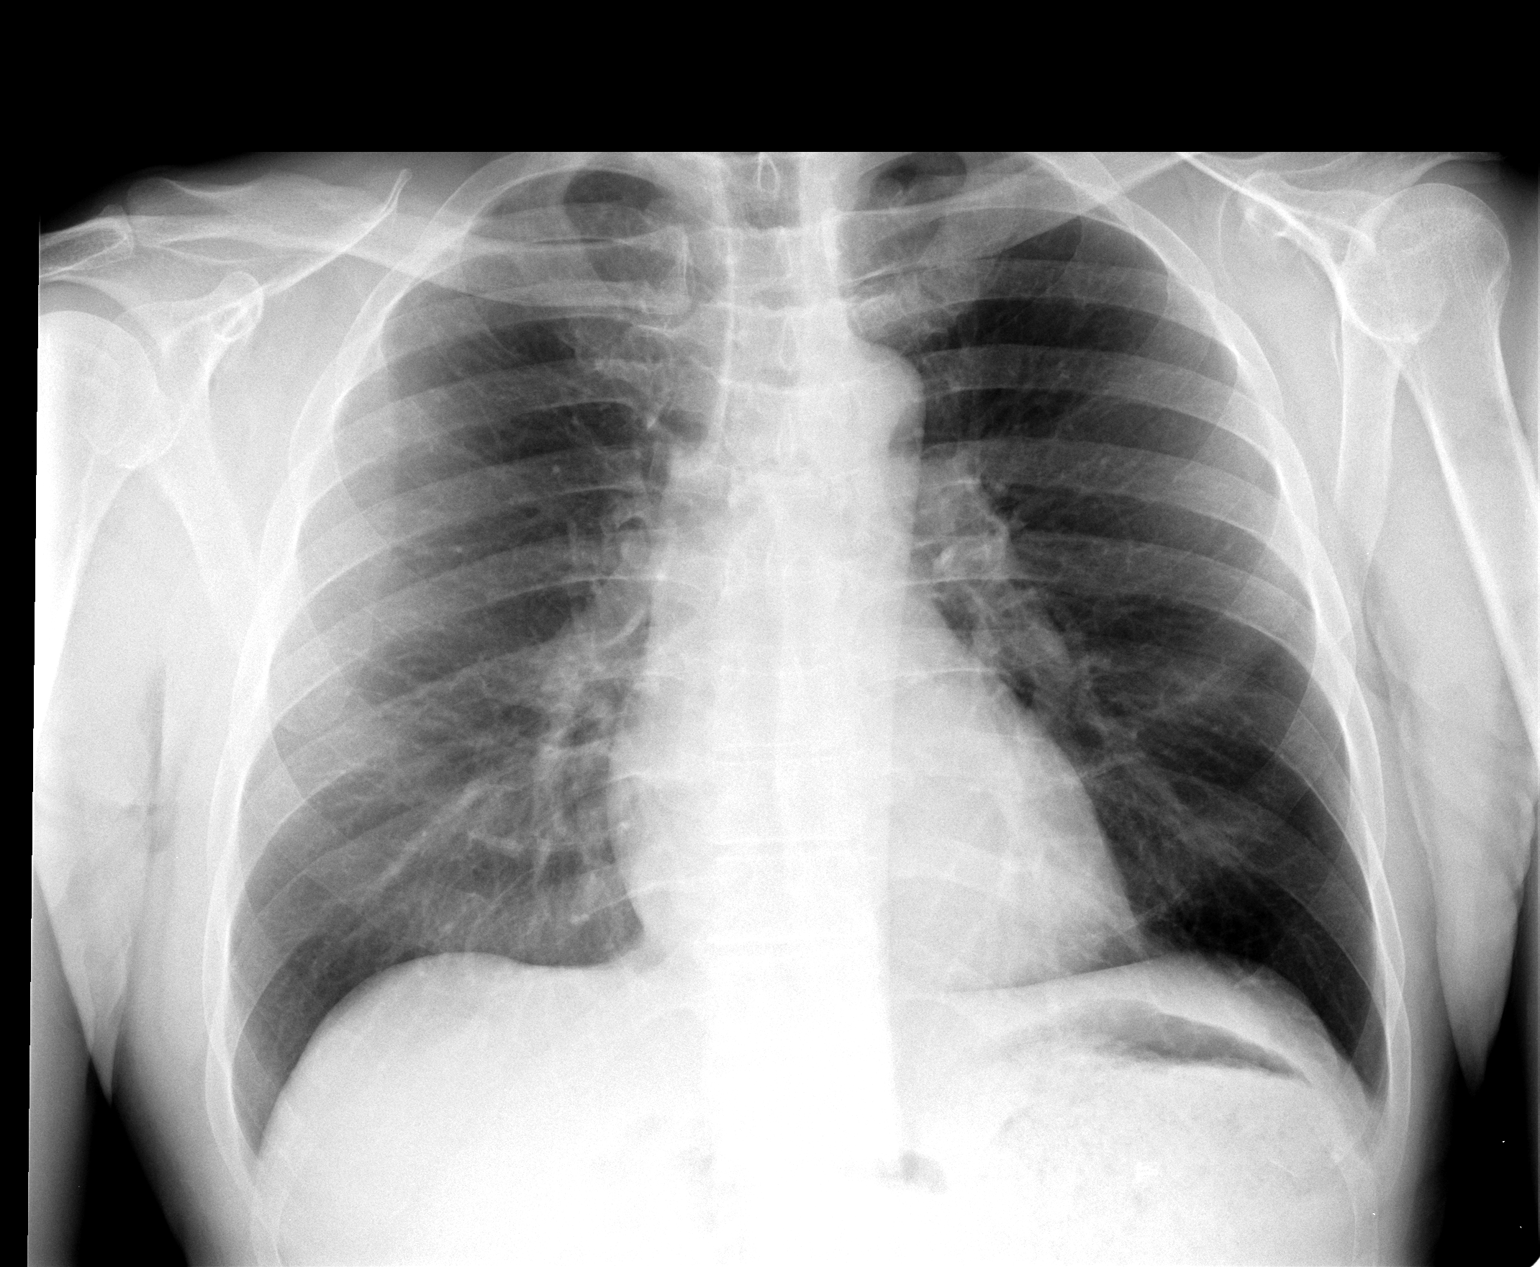

[view not recorded (2 of 2)]
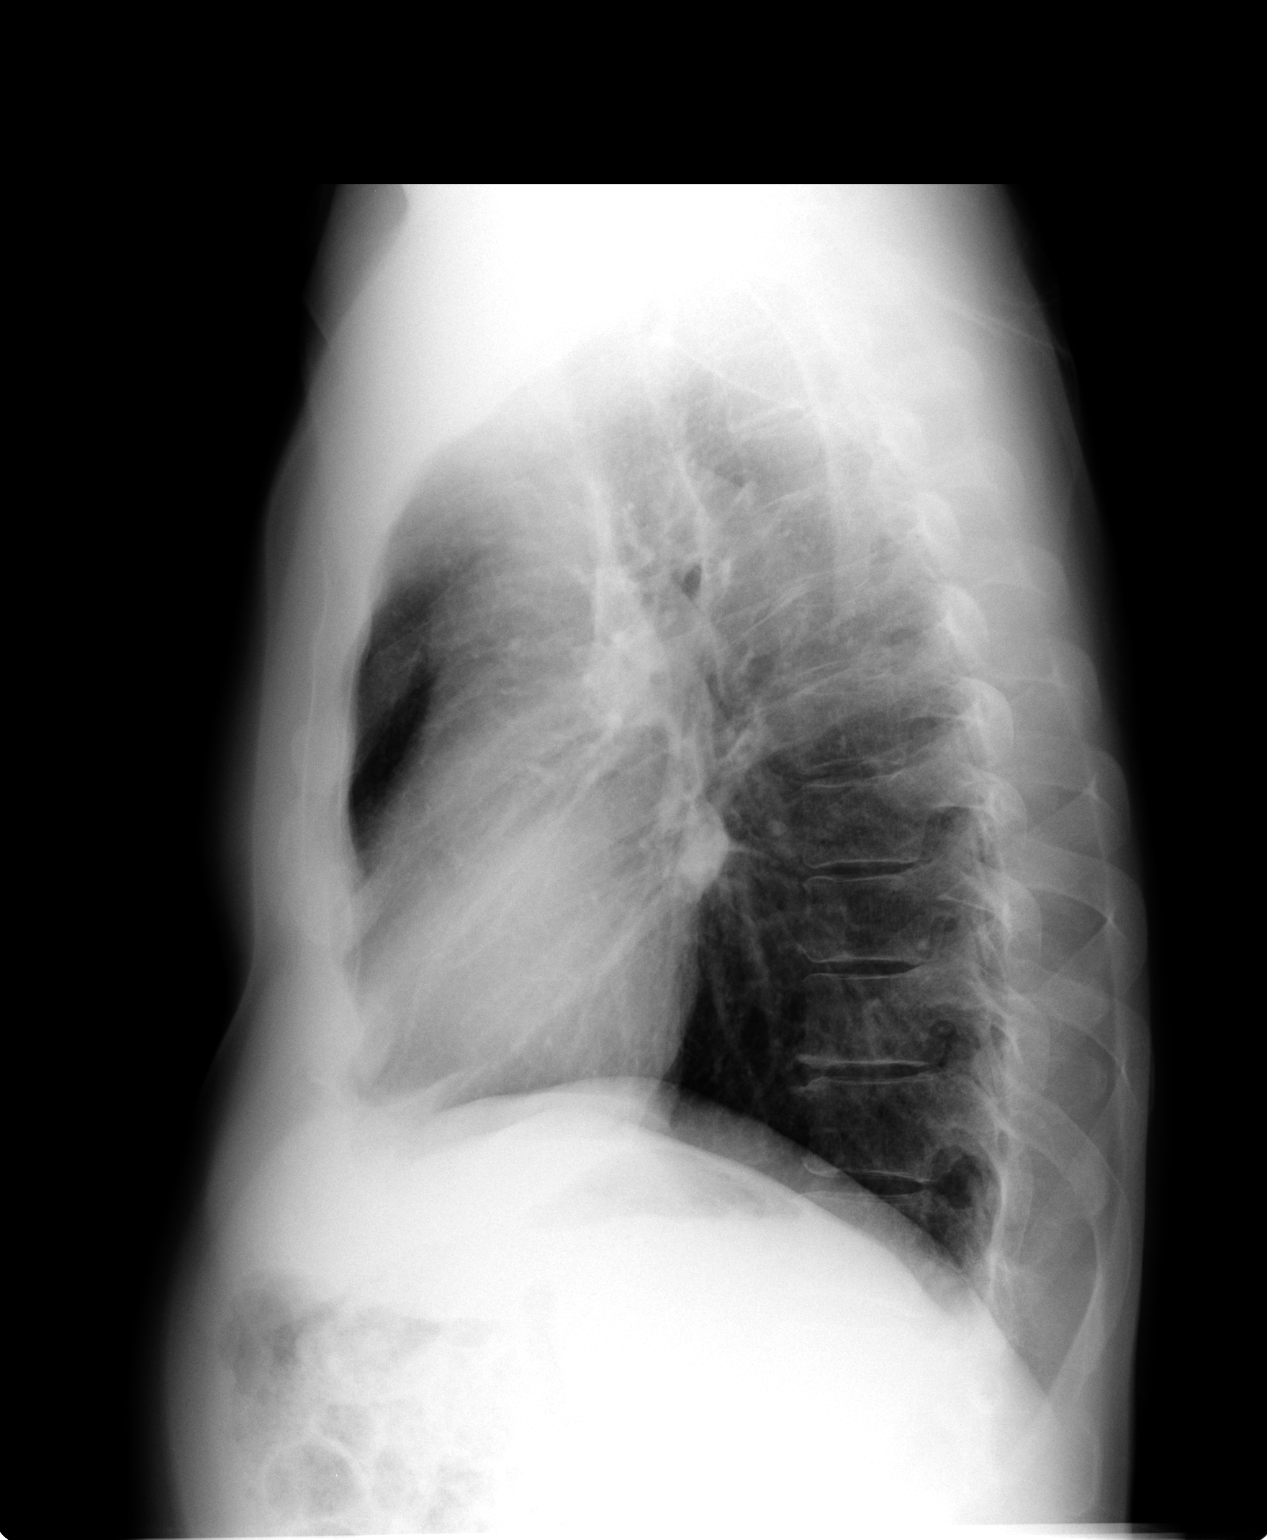

[2 of 2 positions shown; findings below may reference images not displayed]

FINDINGS: The cardiac silhouette, mediastinal and hilar contours are within
normal limits. The lungs are clear. No pleural effusion. The bony
thorax is intact.
IMPRESSION: No acute cardiopulmonary findings.

## 2016-05-13 ENCOUNTER — Encounter: Payer: Self-pay | Admitting: Family Medicine

## 2016-05-13 ENCOUNTER — Ambulatory Visit (INDEPENDENT_AMBULATORY_CARE_PROVIDER_SITE_OTHER): Payer: PRIVATE HEALTH INSURANCE | Admitting: Family Medicine

## 2016-05-13 VITALS — BP 118/80 | HR 67 | Temp 98.0°F | Ht 64.75 in | Wt 146.3 lb

## 2016-05-13 DIAGNOSIS — F439 Reaction to severe stress, unspecified: Secondary | ICD-10-CM | POA: Diagnosis not present

## 2016-05-13 DIAGNOSIS — R002 Palpitations: Secondary | ICD-10-CM

## 2016-05-13 DIAGNOSIS — G4700 Insomnia, unspecified: Secondary | ICD-10-CM | POA: Diagnosis not present

## 2016-05-13 MED ORDER — TEMAZEPAM 15 MG PO CAPS: 15.0000 mg | ORAL_CAPSULE | Freq: Every evening | ORAL | 0 refills | Status: DC | PRN

## 2016-05-13 NOTE — Patient Instructions (Signed)
Stress and Stress Management Stress is a normal reaction to life events. It is what you feel when life demands more than you are used to or more than you can handle. Some stress can be useful. For example, the stress reaction can help you catch the last bus of the day, study for a test, or meet a deadline at work. But stress that occurs too often or for too long can cause problems. It can affect your emotional health and interfere with relationships and normal daily activities. Too much stress can weaken your immune system and increase your risk for physical illness. If you already have a medical problem, stress can make it worse. What are the causes? All sorts of life events may cause stress. An event that causes stress for one person may not be stressful for another person. Major life events commonly cause stress. These may be positive or negative. Examples include losing your job, moving into a new home, getting married, having a baby, or losing a loved one. Less obvious life events may also cause stress, especially if they occur day after day or in combination. Examples include working long hours, driving in traffic, caring for children, being in debt, or being in a difficult relationship. What are the signs or symptoms? Stress may cause emotional symptoms including, the following:  Anxiety. This is feeling worried, afraid, on edge, overwhelmed, or out of control.  Anger. This is feeling irritated or impatient.  Depression. This is feeling sad, down, helpless, or guilty.  Difficulty focusing, remembering, or making decisions. Stress may cause physical symptoms, including the following:  Aches and pains. These may affect your head, neck, back, stomach, or other areas of your body.  Tight muscles or clenched jaw.  Low energy or trouble sleeping. Stress may cause unhealthy behaviors, including the following:  Eating to feel better (overeating) or skipping meals.  Sleeping too little, too  much, or both.  Working too much or putting off tasks (procrastination).  Smoking, drinking alcohol, or using drugs to feel better. How is this diagnosed? Stress is diagnosed through an assessment by your health care provider. Your health care provider will ask questions about your symptoms and any stressful life events.Your health care provider will also ask about your medical history and may order blood tests or other tests. Certain medical conditions and medicine can cause physical symptoms similar to stress. Mental illness can cause emotional symptoms and unhealthy behaviors similar to stress. Your health care provider may refer you to a mental health professional for further evaluation. How is this treated? Stress management is the recommended treatment for stress.The goals of stress management are reducing stressful life events and coping with stress in healthy ways. Techniques for reducing stressful life events include the following:  Stress identification. Self-monitor for stress and identify what causes stress for you. These skills may help you to avoid some stressful events.  Time management. Set your priorities, keep a calendar of events, and learn to say "no." These tools can help you avoid making too many commitments. Techniques for coping with stress include the following:  Rethinking the problem. Try to think realistically about stressful events rather than ignoring them or overreacting. Try to find the positives in a stressful situation rather than focusing on the negatives.  Exercise. Physical exercise can release both physical and emotional tension. The key is to find a form of exercise you enjoy and do it regularly.  Relaxation techniques. These relax the body and mind. Examples include yoga,  meditation, tai chi, biofeedback, deep breathing, progressive muscle relaxation, listening to music, being out in nature, journaling, and other hobbies. Again, the key is to find one or  more that you enjoy and can do regularly.  Healthy lifestyle. Eat a balanced diet, get plenty of sleep, and do not smoke. Avoid using alcohol or drugs to relax.  Strong support network. Spend time with family, friends, or other people you enjoy being around.Express your feelings and talk things over with someone you trust. Counseling or talktherapy with a mental health professional may be helpful if you are having difficulty managing stress on your own. Medicine is typically not recommended for the treatment of stress.Talk to your health care provider if you think you need medicine for symptoms of stress. Follow these instructions at home:  Keep all follow-up visits as directed by your health care provider.  Take all medicines as directed by your health care provider. Contact a health care provider if:  Your symptoms get worse or you start having new symptoms.  You feel overwhelmed by your problems and can no longer manage them on your own. Get help right away if:  You feel like hurting yourself or someone else. This information is not intended to replace advice given to you by your health care provider. Make sure you discuss any questions you have with your health care provider. Document Released: 07/23/2000 Document Revised: 07/05/2015 Document Reviewed: 09/21/2012 Elsevier Interactive Patient Education  2017 [employer].

## 2016-05-13 NOTE — Progress Notes (Signed)
Pre visit review using our clinic review tool, if applicable. No additional management support is needed unless otherwise documented below in the visit note. 

## 2016-05-13 NOTE — Progress Notes (Signed)
Subjective:     Patient ID: Jay Combs, male   DOB: 03/31/1955, 60 y.o.   MRN: [id number]  HPI Patient seen with several week history of some intermittent sensation of "prominent heartbeat ". He's had no chest pain whatsoever. He frequently runs 3-4 miles per day with no discomfort whatsoever. No dyspnea. Symptoms are inconsistent. Denies any recent appetite or weight changes. No diarrhea. 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 He has not noted any increased heart rate when he has taken his pulse and no any irregularity. He had extensive workup couple years ago with echocardiogram and stress test which were all unremarkable. Minimizes caffeine use. No regular alcohol use.  Poor sleep at night. Sometimes wakes up at 2 AM and can't get back to sleep. Denies depression.  Past Medical History:  Diagnosis Date  . Acute maxillary sinusitis 05/22/2009  . ALLERGIC RHINITIS 09/25/2006  . BRADYCARDIA 09/25/2006  . BRONCHITIS, ACUTE WITH MILD BRONCHOSPASM 12/13/2007  . CHEST PAIN UNSPECIFIED 10/28/2007  . EUSTACHIAN TUBE DYSFUNCTION, BILATERAL 06/05/2008  . INSOMNIA, CHRONIC 08/09/2008  . NONSUPPRATV OTITIS MEDIA NOT SPEC AS ACUT/CHRON 06/16/2008  . PALPITATIONS, CHRONIC 10/28/2007  . SEPTIC/HYPOVOLEMIC SHOCK 01/14/2008   Past Surgical History:  Procedure Laterality Date  . NASAL SEPTUM SURGERY      reports that he has never smoked. He has never used smokeless tobacco. He reports that he drinks about 3.0 oz of alcohol per week . He reports that he does not use drugs. family history includes Anxiety disorder in his other; Aortic aneurysm in his mother; Healthy in his father, sister, and son; Heart disease in his other; Hypertension in his other; Other in his son; Uterine cancer in his mother. Allergies  Allergen Reactions  . Demerol [Meperidine] Other (See Comments)    Decreased BP  . Meperidine Hcl     REACTION: decreased BP      Review of Systems  Constitutional: Positive for fatigue.  Eyes: Negative for visual disturbance.  Respiratory: Negative for cough, chest tightness and shortness of breath.   Cardiovascular: Positive for palpitations. Negative for chest pain and leg swelling.  Gastrointestinal: Negative for abdominal pain.  Neurological: Negative for dizziness, syncope, weakness, light-headedness and headaches.  Psychiatric/Behavioral: Positive for sleep disturbance. Negative for dysphoric mood. The patient is nervous/anxious.        Objective:   Physical Exam  Constitutional: He is oriented to person, place, and time. He appears well-developed and well-nourished.  Neck: Neck supple. No thyromegaly present.  Cardiovascular: Normal rate and regular rhythm.  Exam reveals no gallop.   No murmur heard. Pulmonary/Chest: Effort normal and breath sounds normal. No respiratory distress. He has no wheezes. He has no rales.  Musculoskeletal: He exhibits no edema.  Neurological: He is alert and oriented to person, place, and time. No cranial nerve deficit.  Psychiatric: He has a normal mood and affect. His behavior is normal.       Assessment:     Patient presents with several week history of "prominent heartbeat" but no problems with exercise intolerance. He has increased stress and suspect this is impacting his sleep and may be impacting his current symptoms. He had extensive cardiac workup as above previously which was unremarkable    Plan:     -Continue to minimize caffeine intake -Sleep hygiene discussed -Handout on stress management given -Continue Restoril as needed infrequently at night for severe insomnia -We discussed referral for possible cognitive behavioral intervention -  Discussed possible low-dose SSRI but he is reluctant to take regular medication this time.  Eulas Post MD  Primary Care at [facility]

## 2016-05-22 ENCOUNTER — Telehealth: Payer: Self-pay | Admitting: Family Medicine

## 2016-05-22 NOTE — Telephone Encounter (Signed)
[facility] form [place] called in reference to a patient being seen there. Earnest Bailey states that she called the insurance and they stated that the insurance company will call once they receive a claim from [place] will call to see if the PCP was aware of this appointment.  [address] Info: [place], [place]

## 2016-10-30 ENCOUNTER — Encounter: Payer: Self-pay | Admitting: Family Medicine

## 2016-11-05 ENCOUNTER — Telehealth: Payer: Self-pay | Admitting: Family Medicine

## 2016-11-05 NOTE — Telephone Encounter (Signed)
yes

## 2016-11-05 NOTE — Telephone Encounter (Signed)
Pt use to see dr Arnoldo Morale and switched to dr burchette. Pt would like to go to [place] location and see dr Retail banker. Can I sch?

## 2016-11-07 NOTE — Telephone Encounter (Signed)
Yes thanks, fine with me. Needs establish visit for this.

## 2016-11-10 NOTE — Telephone Encounter (Signed)
Yes thanks, but please make sure he understands will have to review full history and go over all preventative health care. We will need to be very focused to be able to achieve all of this at 1 visit. Needs to arrive 15 minute early.

## 2016-11-10 NOTE — Telephone Encounter (Signed)
Pt would like to start off with a cpx instead of transfer visit. Can I sch?

## 2016-11-12 NOTE — Telephone Encounter (Signed)
Pt is aware to arrive 15 min early. Pt is sch for cpx on 01-07-17

## 2016-11-25 ENCOUNTER — Ambulatory Visit: Payer: PRIVATE HEALTH INSURANCE

## 2016-12-01 ENCOUNTER — Ambulatory Visit (INDEPENDENT_AMBULATORY_CARE_PROVIDER_SITE_OTHER): Payer: PRIVATE HEALTH INSURANCE | Admitting: *Deleted

## 2016-12-01 DIAGNOSIS — Z23 Encounter for immunization: Secondary | ICD-10-CM | POA: Diagnosis not present

## 2017-01-07 ENCOUNTER — Encounter: Payer: PRIVATE HEALTH INSURANCE | Admitting: Family Medicine

## 2017-01-12 ENCOUNTER — Ambulatory Visit (INDEPENDENT_AMBULATORY_CARE_PROVIDER_SITE_OTHER): Payer: PRIVATE HEALTH INSURANCE | Admitting: Family Medicine

## 2017-01-12 ENCOUNTER — Encounter: Payer: Self-pay | Admitting: Family Medicine

## 2017-01-12 VITALS — BP 108/78 | HR 64 | Temp 98.1°F | Ht 65.0 in | Wt 145.8 lb

## 2017-01-12 DIAGNOSIS — H02401 Unspecified ptosis of right eyelid: Secondary | ICD-10-CM | POA: Insufficient documentation

## 2017-01-12 DIAGNOSIS — K219 Gastro-esophageal reflux disease without esophagitis: Secondary | ICD-10-CM | POA: Diagnosis not present

## 2017-01-12 DIAGNOSIS — G4700 Insomnia, unspecified: Secondary | ICD-10-CM | POA: Diagnosis not present

## 2017-01-12 DIAGNOSIS — H35373 Puckering of macula, bilateral: Secondary | ICD-10-CM | POA: Insufficient documentation

## 2017-01-12 DIAGNOSIS — Z0000 Encounter for general adult medical examination without abnormal findings: Secondary | ICD-10-CM

## 2017-01-12 DIAGNOSIS — J301 Allergic rhinitis due to pollen: Secondary | ICD-10-CM

## 2017-01-12 DIAGNOSIS — E785 Hyperlipidemia, unspecified: Secondary | ICD-10-CM | POA: Diagnosis not present

## 2017-01-12 DIAGNOSIS — Z125 Encounter for screening for malignant neoplasm of prostate: Secondary | ICD-10-CM

## 2017-01-12 LAB — LIPID PANEL
CHOLESTEROL: 213 mg/dL — AB (ref 0–200)
HDL: 64.2 mg/dL (ref >39.00)
LDL CALC: 137 mg/dL — AB (ref 0–99)
NonHDL: 148.86
Total CHOL/HDL Ratio: 3
Triglycerides: 57 mg/dL (ref 0.0–149.0)
VLDL: 11.4 mg/dL (ref 0.0–40.0)

## 2017-01-12 LAB — COMPREHENSIVE METABOLIC PANEL
ALT: 11 U/L (ref 0–53)
AST: 15 U/L (ref 0–37)
Albumin: 4.5 g/dL (ref 3.5–5.2)
Alkaline Phosphatase: 49 U/L (ref 39–117)
BUN: 18 mg/dL (ref 6–23)
CHLORIDE: 105 meq/L (ref 96–112)
CO2: 28 mEq/L (ref 19–32)
Calcium: 9.5 mg/dL (ref 8.4–10.5)
Creatinine, Ser: 0.98 mg/dL (ref 0.40–1.50)
GFR: 83.11 mL/min (ref >60.00)
Glucose, Bld: 100 mg/dL — ABNORMAL HIGH (ref 70–99)
POTASSIUM: 4.7 meq/L (ref 3.5–5.1)
Sodium: 139 mEq/L (ref 135–145)
Total Bilirubin: 1 mg/dL (ref 0.2–1.2)
Total Protein: 6.9 g/dL (ref 6.0–8.3)

## 2017-01-12 LAB — CBC
HCT: 44.3 % (ref 39.0–52.0)
Hemoglobin: 14.6 g/dL (ref 13.0–17.0)
MCHC: 33 g/dL (ref 30.0–36.0)
MCV: 92.5 fl (ref 78.0–100.0)
Platelets: 238 10*3/uL (ref 150.0–400.0)
RBC: 4.79 Mil/uL (ref 4.22–5.81)
RDW: 13.2 % (ref 11.5–15.5)
WBC: 5 10*3/uL (ref 4.0–10.5)

## 2017-01-12 LAB — PSA: PSA: 1.47 ng/mL (ref 0.10–4.00)

## 2017-01-12 NOTE — Assessment & Plan Note (Signed)
Mild hyperlipidemia- using last visit and todays age- 69 year risk is 5.7%. Discussed statin use- risk likely has trended up as total and LDL about 20 points lower 2 years ago. Discussed lifestyle changes -actually doin greally well with health eating. Update lipids

## 2017-01-12 NOTE — Patient Instructions (Signed)
Please stop by lab before you go   

## 2017-01-12 NOTE — Assessment & Plan Note (Signed)
Ptosis 2016- extensive eval with Dr. Posey Pronto. Some short term memory issues which were thought stress related. Still has issues with this. Balance issues when stressed at times. Last few months no issues. Mild orthostatic issues at times. Continue to monitor.
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
## 2017-01-13 ENCOUNTER — Encounter: Payer: Self-pay | Admitting: Family Medicine

## 2017-04-06 ENCOUNTER — Encounter: Payer: Self-pay | Admitting: Family Medicine

## 2017-06-15 ENCOUNTER — Ambulatory Visit: Payer: No Typology Code available for payment source | Admitting: Family Medicine

## 2017-06-15 ENCOUNTER — Telehealth: Payer: Self-pay | Admitting: Family Medicine

## 2017-06-15 ENCOUNTER — Encounter: Payer: Self-pay | Admitting: Family Medicine

## 2017-06-15 VITALS — BP 108/72 | HR 70 | Temp 97.7°F | Ht 65.0 in | Wt 146.4 lb

## 2017-06-15 DIAGNOSIS — S93412A Sprain of calcaneofibular ligament of left ankle, initial encounter: Secondary | ICD-10-CM

## 2017-06-15 DIAGNOSIS — Z23 Encounter for immunization: Secondary | ICD-10-CM | POA: Diagnosis not present

## 2017-06-15 NOTE — Telephone Encounter (Signed)
See note

## 2017-06-15 NOTE — Telephone Encounter (Signed)
Patient was seen in office today.  

## 2017-06-15 NOTE — Patient Instructions (Addendum)
Shingrix #1 today. Repeat injection in 2-3 months. Schedule this before you leave.   Left Ankle Sprain. You are doing a great job with RICE (rest, ice, compression, elevation). Keep this up. Im hoping in the next 2 weeks you will feel like getting back on the elliptical. This may take up to 6 weeks to feel fully better but should be getting better each week (if it fails to do so let me either get you in with our sports medicine doctors or you can se your podiatrist)  Wear brace if you find it helpful. Otherwise- just try to avoid anything that aggravates the ankle.   Do exercises 3 x a week for a month then once weekly for another month. Only do them if causes 1-2/10 pain or less

## 2017-06-15 NOTE — Telephone Encounter (Signed)
Copied from [place] [id number]. Topic: Quick Communication - See Telephone Encounter >> Jun 15, 2017  9:59 AM Arletha Grippe wrote: CRM for notification. See Telephone encounter for: 06/15/17.  Pt states he has a sprained ankle.  It is turning black and blue.  Pt would like to know if Dr [name] can take care of this or if pt needs to go to foot doctor.  Please call [phone].  If he thins a foot doctor is better, does he have a recommendation for pt? Pt can not get into [facility] week.

## 2017-06-15 NOTE — Progress Notes (Signed)
Subjective:  Jay Combs is a 62 y.o. year old very pleasant male patient who presents for/with See problem oriented charting ROS-no fever, chills, nausea, vomiting.  No expanding redness on ankle though does have some bruising  Past Medical History-  Patient Active Problem List   Diagnosis Date Noted  . GERD (gastroesophageal reflux disease) 01/12/2017    Priority: Medium  . INSOMNIA, CHRONIC 08/09/2008    Priority: Medium  . PALPITATIONS, CHRONIC 10/28/2007    Priority: Medium  . Macular pucker of both eyes 01/12/2017    Priority: Low  . Allergic rhinitis 09/25/2006    Priority: Low  . Hyperlipidemia 01/12/2017  . Ptosis, right 01/12/2017    Medications- reviewed and updated No current outpatient medications on file.   No current facility-administered medications for this visit.     Objective: BP 108/72 (BP Location: Left Arm, Patient Position: Sitting, Cuff Size: Normal)   Pulse 70   Temp 97.7 F (36.5 C) (Oral)   Ht 5\' 5"  (1.651 m)   Wt 146 lb 6.4 oz (66.4 kg)   SpO2 97%   BMI 24.36 kg/m  Gen: NAD, resting comfortably CV: RRR , 2+ PT and DP pulses Lungs: Nonlabored breathing, normal respiratory rate Ext: no edema Skin: warm, dry MSK Ankle: No visible erythema.  Some swelling over lateral ankle noted. Range of motion is full in all directions except limited by pain with inversion Strength is 5/5 in all directions. Stable lateral and medial ligaments;  No pain at base of 5th MT; No tenderness over cuboid; No tenderness on posterior aspects of lateral and medial malleolus Able to walk >4 steps easily   Assessment/Plan:  Sprain of calcaneofibular ligament of left ankle, initial encounter S: Patient was doing yard work last week when he turned his left ankle.  He noted some pain and swelling immediately.  Pain has not been over about a 3 or 4 out of 10 at its peak.  Currently with walking its about a 3.  At rest can be about a 2 out of 10 pain.  Aching  sensation.  He has been able to walk on it from the time of injury.  He felt like it was slowly getting better but then noticed some slight increased swelling and some bruising on lateral foot today so decided to be evaluated.  He has been doing a good job with rest, ice, compression, elevation A/P: From AVS: "Left Ankle Sprain. You are doing a great job with RICE (rest, ice, compression, elevation). Keep this up. Im hoping in the next 2 weeks you will feel like getting back on the elliptical. This may take up to 6 weeks to feel fully better but should be getting better each week (if it fails to do so let me either get you in with our sports medicine doctors or you can se your podiatrist)  Wear brace if you find it helpful. Otherwise- just try to avoid anything that aggravates the ankle.   Do exercises 3 x a week for a month then once weekly for another month. Only do them if causes 1-2/10 pain or less"  We discussed trial of NSAID-he declined since pain is minimal  We fitted him with a brace to see if he will be helpful with stepdown as that is the most painful position for him. It was not helpful so patient will not wear this.   Lab/Order associations: Need for prophylactic vaccination and inoculation against varicella - Plan: Varicella-zoster vaccine IM (Shingrix)  Return  precautions advised.  Garret Reddish, MD

## 2017-07-08 ENCOUNTER — Encounter: Payer: Self-pay | Admitting: Family Medicine

## 2017-07-09 ENCOUNTER — Telehealth: Payer: Self-pay | Admitting: Family Medicine

## 2017-07-09 NOTE — Telephone Encounter (Signed)
I have emailed charge corrections to resubmit the claim for the date of service 06-15-17 to [facility]. I am awaiting a response.

## 2017-07-14 ENCOUNTER — Encounter: Payer: Self-pay | Admitting: Family Medicine

## 2017-07-16 ENCOUNTER — Other Ambulatory Visit: Payer: Self-pay

## 2017-07-16 MED ORDER — DEXLANSOPRAZOLE 60 MG PO CPDR: 60.0000 mg | DELAYED_RELEASE_CAPSULE | Freq: Every day | ORAL | 0 refills | Status: DC

## 2017-08-12 ENCOUNTER — Encounter: Payer: Self-pay | Admitting: Family Medicine

## 2017-08-12 ENCOUNTER — Ambulatory Visit: Payer: No Typology Code available for payment source | Admitting: Family Medicine

## 2017-08-12 ENCOUNTER — Ambulatory Visit: Payer: Self-pay | Admitting: *Deleted

## 2017-08-12 VITALS — BP 108/70 | HR 66 | Temp 98.6°F | Ht 65.0 in | Wt 143.8 lb

## 2017-08-12 DIAGNOSIS — F419 Anxiety disorder, unspecified: Secondary | ICD-10-CM | POA: Diagnosis not present

## 2017-08-12 DIAGNOSIS — R002 Palpitations: Secondary | ICD-10-CM

## 2017-08-12 MED ORDER — ALPRAZOLAM 0.25 MG PO TABS: 0.2500 mg | ORAL_TABLET | Freq: Two times a day (BID) | ORAL | 0 refills | Status: DC | PRN

## 2017-08-12 NOTE — Patient Instructions (Signed)
-   short term xanax use. If you feel you need more long term medications there are other options that we use on daily basis like lexapro.  - counseling - if you have exertional symptoms, you will see [place] back immediately or see Dr. [name]  PRN follow up if feel like not going in right direction

## 2017-08-12 NOTE — Progress Notes (Signed)
Subjective:  Jay Combs is a 62 y.o. year old very pleasant male patient who presents for/with See problem oriented charting ROS- admits to anxiety and chest pain along with this, no exertional chest pain. No shortness of breath, lightheadedness, left arm or neck pain   Past Medical History-  Patient Active Problem List   Diagnosis Date Noted  . GERD (gastroesophageal reflux disease) 01/12/2017    Priority: Medium  . INSOMNIA, CHRONIC 08/09/2008    Priority: Medium  . PALPITATIONS, CHRONIC 10/28/2007    Priority: Medium  . Macular pucker of both eyes 01/12/2017    Priority: Low  . Allergic rhinitis 09/25/2006    Priority: Low  . Anxiety 08/12/2017  . Hyperlipidemia 01/12/2017  . Ptosis, right 01/12/2017    Medications- reviewed and updated Current Outpatient Medications  Medication Sig Dispense Refill  . dexlansoprazole (DEXILANT) 60 MG capsule Take 1 capsule (60 mg total) by mouth daily. 30 capsule 0   No current facility-administered medications for this visit.     Objective: BP 108/70 (BP Location: Left Arm, Patient Position: Sitting, Cuff Size: Large)   Pulse 66   Temp 98.6 F (37 C) (Oral)   Ht 5\' 5"  (1.651 m)   Wt 143 lb 12.8 oz (65.2 kg)   SpO2 97%   BMI 23.93 kg/m  Gen: NAD, resting comfortably CV: RRR no murmurs rubs or gallops Lungs: CTAB no crackles, wheeze, rhonchi Abdomen: soft/nontender/nondistended/normal bowel sounds.  Ext: no edema Skin: warm, dry Psych: some anxiety noted  EKG: sinus rhytm with rate 64, normal axis, normal integrals, no st or t wave changes  Assessment/Plan:  Anxiety Palpitations S: Patient has dealt with some palpitations recently over last 2-3 weeks.Feeling palpitations on a daily basis at this point. Wore heart monitors years ago and denies irregularities being found. Has had left chest discomfort on and off for last 2-3 weeks as well - last for  5-10 minutes. Has not happened with exertion. He is keeping up elliptical 4  days a week. hasnt been able to get heart rate up to 150 or 152- hasnt been able to get past 130 or 135 lately. Still exerting himself similarly without chest pain or shortness of breath though- no exertional symptoms whatsoever. Also, no left arm or neck pain, no numbness tingling into left hand, dizziness, abnormal sweating.    Patient has had 3-4 episodes of periods of palpitations in the past 20 years. Dr. [name] is a family friend and he saw him about 5 years ago and had extensive workup at that time. This included echo 2015 with normal EF and diastolic function, no valvular issues. Myoview noted from Dr. [name]- though I cant see in records  Slightly more anxious lately. Losing temper more than usual. Lost job 5 years ago- fired twice by same company at that time- at origin of all this- this replays over and over and promotes feelings of inadequacy. 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  A/P: Strongly suspect anxiety related chest pain and palpitations given prior extensive history. Anxiety seems to be related to job loss prior- more situational than GAD. No anhedonia- no depression as result - reassuring ekg - prior extensive cardiac eval reassuring 2013. Echo looked good 2015 - no exertional symptoms Plan - offered holter, he would like to hold off on holter for now - short term xanax use. If you feel you need more long term  medications there are other options that we use on daily basis like lexapro. He will not drive for 8 hours after taking - counseling - if you have exertional symptoms, you will see [place] back immediately or see Dr. [name]  He used xanax in 2013- #20 lasted extended period. #10 I am hoping will last him at least 3-4 months.   Future Appointments  Date Time Provider [place]  08/31/2017  9:15 AM [facility] NURSE [facility]   Lab/Order associations: Palpitations - Plan: EKG  12-Lead  Anxiety - Plan: ALPRAZolam (XANAX) 0.25 MG tablet  Meds ordered this encounter  Medications  . ALPRAZolam (XANAX) 0.25 MG tablet    Sig: Take 1 tablet (0.25 mg total) by mouth 2 (two) times daily as needed for anxiety or sleep.    Dispense:  10 tablet    Refill:  0    Return precautions advised.  Garret Reddish, MD

## 2017-08-12 NOTE — Telephone Encounter (Signed)
See note

## 2017-08-12 NOTE — Telephone Encounter (Signed)
Patient was seen in office this morning. 

## 2017-08-12 NOTE — Telephone Encounter (Signed)
Patient is calling to report he is concerned about some more frequent palpitations and increased heart rates he has been having. He does exercise and he does not report pain with the episodes. He does report increased stress and some changes in his breathing when they occur.   Reason for Disposition . Problems with anxiety or stress  Answer Assessment - Initial Assessment Questions 1. DESCRIPTION: "Please describe your heart rate or heart beat that you are having" (e.g., fast/slow, regular/irregular, skipped or extra beats, "palpitations")     Feels that the heartbeat is pounding and racing and then goes back to normal. [facility]- he does do 4 miles on elliptical his heart rate does go up to 140-150 then 2. ONSET: "When did it start?" (Minutes, hours or days)      3-4 weeks 3. DURATION: "How long does it last" (e.g., seconds, minutes, hours)     Episodes normally last- a few minutes 4. PATTERN "Does it come and go, or has it been constant since it started?"  "Does it get worse with exertion?"   "Are you feeling it now?"     Comes and goes, does not get worse with exertion, does feel it today 5. TAP: "Using your hand, can you tap out what you are feeling on a chair or table in front of you, so that I can hear?" (Note: not all patients can do this)       Can't tell if skipping beats 6. HEART RATE: "Can you tell me your heart rate?" "How many beats in 15 seconds?"  (Note: not all patients can do this)       Patient has not counted heart rate during palpatations 7. RECURRENT SYMPTOM: "Have you ever had this before?" If so, ask: "When was the last time?" and "What happened that time?"      Years ago- 5+ patient was checked and everything was fine 8. CAUSE: "What do you think is causing the palpitations?"     Stress and anxiety 9. CARDIAC HISTORY: "Do you have any history of heart disease?" (e.g., heart attack, angina, bypass surgery, angioplasty, arrhythmia)      No 10. OTHER SYMPTOMS: "Do  you have any other symptoms?" (e.g., dizziness, chest pain, sweating, difficulty breathing)       Sometimes he has more difficult breathing with it- not frequently 11. PREGNANCY: "Is there any chance you are pregnant?" "When was your last menstrual period?"       n/a  Protocols used: HEART RATE AND HEARTBEAT QUESTIONS-A-AH

## 2017-08-12 NOTE — Assessment & Plan Note (Signed)
Palpitations S: Patient has dealt with some palpitations recently over last 2-3 weeks.Feeling palpitations on a daily basis at this point. Wore heart monitors years ago and denies irregularities being found. Has had left chest discomfort on and off for last 2-3 weeks as well - last for  5-10 minutes. Has not happened with exertion. He is keeping up elliptical 4 days a week. hasnt been able to get heart rate up to 150 or 152- hasnt been able to get past 130 or 135 lately. Still exerting himself similarly without chest pain or shortness of breath though- no exertional symptoms whatsoever. Also, no left arm or neck pain, no numbness tingling into left hand, dizziness, abnormal sweating.    Patient has had 3-4 episodes of periods of palpitations in the past 20 years. Dr. [name] is a family friend and he saw him about 5 years ago and had extensive workup at that time. This included echo 2015 with normal EF and diastolic function, no valvular issues. Myoview noted from Dr. [name]- though I cant see in records  Slightly more anxious lately. Losing temper more than usual. Lost job 5 years ago- fired twice by same company at that time- at origin of all this- this replays over and over and promotes feelings of inadequacy. 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  A/P: Strongly suspect anxiety related chest pain and palpitations given prior extensive history. Anxiety seems to be related to job loss prior- more situational than GAD. No anhedonia- no depression as result - reassuring ekg - prior extensive cardiac eval reassuring 2013. Echo looked good 2015 - no exertional symptoms Plan - offered holter, he would like to hold off on holter for now - short term xanax use. If you feel you need more long term medications there are other options that we use on daily basis like lexapro. He will not drive for 8 hours after taking - counseling - if  you have exertional symptoms, you will see [place] back immediately or see Dr. [name]

## 2017-08-20 ENCOUNTER — Telehealth: Payer: Self-pay | Admitting: Family Medicine

## 2017-08-20 DIAGNOSIS — F419 Anxiety disorder, unspecified: Secondary | ICD-10-CM

## 2017-08-20 MED ORDER — ALPRAZOLAM 0.25 MG PO TABS: 0.2500 mg | ORAL_TABLET | Freq: Two times a day (BID) | ORAL | 0 refills | Status: DC | PRN

## 2017-08-20 NOTE — Telephone Encounter (Signed)
Patient requests referral through his behavioral health specialist until he can start EMDR sessions (will take about 2-3 sessions to start)

## 2017-08-31 ENCOUNTER — Ambulatory Visit (INDEPENDENT_AMBULATORY_CARE_PROVIDER_SITE_OTHER): Payer: No Typology Code available for payment source | Admitting: *Deleted

## 2017-08-31 ENCOUNTER — Encounter: Payer: Self-pay | Admitting: *Deleted

## 2017-08-31 DIAGNOSIS — Z23 Encounter for immunization: Secondary | ICD-10-CM

## 2017-08-31 NOTE — Progress Notes (Signed)
Per orders of Dr. [name], injection of Shingrix 0.5 ml given in Right Deltoid by Anselmo Pickler, LPN Patient tolerated injection well and has completed series of Shingrix.

## 2017-09-14 ENCOUNTER — Encounter: Payer: Self-pay | Admitting: Family Medicine

## 2017-09-14 ENCOUNTER — Encounter (HOSPITAL_COMMUNITY): Payer: Self-pay | Admitting: Emergency Medicine

## 2017-09-14 ENCOUNTER — Emergency Department (HOSPITAL_COMMUNITY)
Admission: EM | Admit: 2017-09-14 | Discharge: 2017-09-14 | Disposition: A | Discharge status: Home / Self Care | Payer: No Typology Code available for payment source | Attending: Emergency Medicine | Admitting: Emergency Medicine

## 2017-09-14 ENCOUNTER — Emergency Department (HOSPITAL_COMMUNITY): Payer: No Typology Code available for payment source

## 2017-09-14 DIAGNOSIS — R002 Palpitations: Secondary | ICD-10-CM | POA: Diagnosis not present

## 2017-09-14 DIAGNOSIS — Z79899 Other long term (current) drug therapy: Secondary | ICD-10-CM | POA: Diagnosis not present

## 2017-09-14 LAB — CBC
HEMATOCRIT: 43.8 % (ref 39.0–52.0)
HEMOGLOBIN: 14.4 g/dL (ref 13.0–17.0)
MCH: 30 pg (ref 26.0–34.0)
MCHC: 32.9 g/dL (ref 30.0–36.0)
MCV: 91.3 fL (ref 78.0–100.0)
Platelets: 213 10*3/uL (ref 150–400)
RBC: 4.8 MIL/uL (ref 4.22–5.81)
RDW: 13.1 % (ref 11.5–15.5)
WBC: 6.9 10*3/uL (ref 4.0–10.5)

## 2017-09-14 LAB — BASIC METABOLIC PANEL
ANION GAP: 8 (ref 5–15)
BUN: 15 mg/dL (ref 6–20)
CO2: 23 mmol/L (ref 22–32)
Calcium: 9.2 mg/dL (ref 8.9–10.3)
Chloride: 107 mmol/L (ref 98–111)
Creatinine, Ser: 0.99 mg/dL (ref 0.61–1.24)
GFR calc Af Amer: >60 mL/min (ref >60)
GFR calc non Af Amer: >60 mL/min (ref >60)
GLUCOSE: 96 mg/dL (ref 70–99)
POTASSIUM: 3.8 mmol/L (ref 3.5–5.1)
Sodium: 138 mmol/L (ref 135–145)

## 2017-09-14 LAB — I-STAT TROPONIN, ED
Troponin i, poc: 0 ng/mL (ref 0.00–0.08)
Troponin i, poc: 0 ng/mL (ref 0.00–0.08)

## 2017-09-14 NOTE — ED Notes (Signed)
Pt has taken 81mg  ASP

## 2017-09-14 NOTE — ED Triage Notes (Signed)
Pt c/o of heart palpitations starting 1100 today. Pt has no cards history.  Also c/o tingles in L arm, SOB, and broke out in a cold sweat twice today. Had an EKG done 30 days ago with no abnormality.

## 2017-09-14 NOTE — ED Provider Notes (Signed)
[facility] EMERGENCY DEPARTMENT Provider Note   CSN: [id number] Arrival date & time: 09/14/17  1237     History   Chief Complaint Chief Complaint  Patient presents with  . Palpitations    HPI Jay Combs is a 62 y.o. male.  HPI Patient is a 62 year old male with no significant PMH who presents to the ED for evaluation of palpitations. Pt reports that over the past month he has had a couple episodes of pounding sensation in his chest with rapid heart rate. Has been evaluated by his PCP approximately 1 month ago for similar symptoms and reports that he had an unremarkable EKG at that time.  His work-up has also included a stress test in 2013 and echo in 2015 both of which were unremarkable.  He denies any known cardiac history.  He reports that his PCP attributed these symptoms to anxiety and was prescribed a short course of Xanax approximate 1 month ago that did improve symptoms however he has since ran out of his Xanax and symptoms have recurred.  This morning had episode lasted maybe 1 hour with pounding in his chest. Also had brief tingling in his LUE.  His wife reports that he did break out into sweats when it began talking about coming to the hospital.  He denies any chest pain though does report that the pounding sensation is heavy.  These events occur randomly.  He exercises 5 days a week including running 4 miles on the elliptical daily and has had no symptoms with exercise. No syncope. States that he is typically able to get his heart rate up to 150 while exercising however over the past week his heart rate stayed in the 130s-140s.  No leg swelling.  No history of DVT or PE. Does not smoke. No history PVD/CVA. No daily meds. Did take aspirin with todays episode.   Past Medical History:  Diagnosis Date  . ALLERGIC RHINITIS 09/25/2006  . BRADYCARDIA 09/25/2006   athletic Heart rate  . CHEST PAIN UNSPECIFIED 10/28/2007   cardiology evaluation including stress test  reassuring  . INSOMNIA, CHRONIC 08/09/2008  . PALPITATIONS, CHRONIC 10/28/2007    Patient Active Problem List   Diagnosis Date Noted  . Anxiety 08/12/2017  . GERD (gastroesophageal reflux disease) 01/12/2017  . Macular pucker of both eyes 01/12/2017  . Hyperlipidemia 01/12/2017  . Ptosis, right 01/12/2017  . INSOMNIA, CHRONIC 08/09/2008  . PALPITATIONS, CHRONIC 10/28/2007  . Allergic rhinitis 09/25/2006    Past Surgical History:  Procedure Laterality Date  . NASAL SEPTUM SURGERY          Home Medications    Prior to Admission medications   Medication Sig Start Date End Date Taking? Authorizing Provider  ALPRAZolam (XANAX) 0.25 MG tablet Take 1 tablet (0.25 mg total) by mouth 2 (two) times daily as needed for anxiety or sleep. 08/20/17 09/19/17 Yes [name], MD  aspirin EC 81 MG tablet Take 81 mg by mouth once.   Yes [provider]  dexlansoprazole (DEXILANT) 60 MG capsule Take 1 capsule (60 mg total) by mouth daily. 07/16/17  Yes [name], MD  Lactobacillus (PROBIOTIC ACIDOPHILUS PO) Take 1 capsule by mouth daily.   Yes [provider]  ranitidine (ZANTAC) 150 MG tablet Take 150 mg by mouth as needed for heartburn.   Yes [provider]    Family History Family History  Problem Relation Age of Onset  . Aortic aneurysm Mother  never smoker  . Breast cancer Mother        stage I- mastectomy  . Cervical cancer Mother        ? uterine.   . Lung cancer Father        smoker. chemo, radiation. surgery not available- spots shrunk some. depressed mood  . Scoliosis Sister        also some head trauma after abuse from spouse  . Other Son        X-linked gammaglobulinemia. monthly infusions  . Healthy Son   . Other Maternal Grandmother        unilateral kidney but lived to 55  . Heart attack Paternal Grandfather        younger than 47- but patients dad has not had issues    Social History Social History   Tobacco Use  .  Smoking status: Never Smoker  . Smokeless tobacco: Never Used  Substance Use Topics  . Alcohol use: Yes    Alcohol/week: 3.0 oz    Types: 5 Glasses of wine per week    Comment: 3-4 glasses per week, then some wine on weekend  . Drug use: No     Allergies   Demerol [meperidine] and Meperidine hcl   Review of Systems Review of Systems  Constitutional: Positive for diaphoresis. Negative for chills and fever.  HENT: Negative for congestion.   Respiratory: Negative for cough and shortness of breath.   Cardiovascular: Positive for palpitations. Negative for chest pain and leg swelling.  Gastrointestinal: Negative for abdominal pain, diarrhea, nausea and vomiting.  Genitourinary: Negative for dysuria and hematuria.  Musculoskeletal: Negative for back pain and neck pain.  Skin: Negative for rash.  Neurological: Negative for weakness, numbness and headaches.     Physical Exam Updated Vital Signs BP 116/85 (BP Location: Right Arm)   Pulse 61   Temp 98.7 F (37.1 C) (Oral)   Resp 19   Ht 5\' 7"  (1.702 m)   Wt 64.9 kg (143 lb)   SpO2 100%   BMI 22.40 kg/m   Physical Exam  Constitutional: No distress.  HENT:  Head: Normocephalic and atraumatic.  Eyes: Conjunctivae are normal. Right eye exhibits no discharge. Left eye exhibits no discharge.  Neck: Normal range of motion. No tracheal deviation present.  Cardiovascular: Regular rhythm, normal heart sounds and intact distal pulses. Exam reveals no gallop and no friction rub.  No murmur heard. 2+ radial pulses bilat  Pulmonary/Chest: Effort normal and breath sounds normal. No respiratory distress.  Abdominal: Soft. Bowel sounds are normal. He exhibits no distension. There is no tenderness.  Musculoskeletal: He exhibits no edema or deformity.  Neurological: He is alert. He exhibits normal muscle tone.  Skin: No rash noted. He is not diaphoretic.  Psychiatric: He has a normal mood and affect.     ED Treatments / Results   Labs (all labs ordered are listed, but only abnormal results are displayed) Labs Reviewed  BASIC METABOLIC PANEL  CBC  I-STAT TROPONIN, ED  I-STAT TROPONIN, ED    EKG EKG Interpretation  Date/Time:  Monday September 14 2017 13:05:36 EDT Ventricular Rate:  74 PR Interval:  142 QRS Duration: 86 QT Interval:  382 QTC Calculation: 424 R Axis:   90 Text Interpretation:  Normal sinus rhythm Rightward axis Borderline ECG No acute changes No old tracing to compare Confirmed by Varney Biles [phone]) on 09/14/2017 4:26:52 PM   Radiology Dg Chest 2 View  Result Date: 09/14/2017 CLINICAL DATA:  Palpitations.  Diaphoretic and weakness. EXAM: CHEST - 2 VIEW COMPARISON:  Chest radiograph May 10, 2013 FINDINGS: Cardiomediastinal silhouette is normal. No pleural effusions or focal consolidations. Mild chronic bronchitic changes. Trachea projects midline and there is no pneumothorax. Soft tissue planes and included osseous structures are non-suspicious. Mild pectus excavatum. IMPRESSION: Mild chronic bronchitic changes without focal consolidation. Electronically Signed   By: Elon Alas M.D.   On: 09/14/2017 13:28    Procedures Procedures (including critical care time)  Medications Ordered in ED Medications - No data to display   Initial Impression / Assessment and Plan / ED Course  I have reviewed the triage vital signs and the nursing notes.  Pertinent labs & imaging results that were available during my care of the patient were reviewed by me and considered in my medical decision making (see chart for details).    Patient is a 62 year old male with no significant PMH who presents to the ED for evaluation of palpitations. Pt reports that over the past month he has had a couple episodes of pounding sensation in his chest with rapid heart rate.   Patient is afebrile and hemodynamically stable at presentation.  ECG reviewed with rate of approximate 75, NSR, borderline rightward axis,  intervals within normal limits, no acute ST or T wave changes to suggest acute ischemia.  No evidence of W PW, Brugada, HCM, or prolonged QTC, or ARVD. No major changes when compared to prior ECG. Review of records shows TSH wnl in 2014, 2016, and most recently 10/08/15. No findings at this time to suggest hyper/hypothyroid as etiology. CBC unremarkable with no anemia or abnormalities appreciated. Metabolic panel also unremarkable with no significant electrolyte derangements. Low risk for ACS by HEART score (2). No findings to suggest PE and low risk by Well's. No tachycardia, tachypnea, SOA, or leg swelling to necessitate further workup at this time. CXR w/no acute cardiopulmonary abnormalities. Given unremarkable workup and had improvement with xanex in past, anxiety could be contributory. Asymptomatic in ED. Advised to f/u with PCP and discuss cardiac monitor such as holter or zio patch. Pt and wife comfortable with plan. Stable at discharge.    Case and plan of care discussed with Dr. Kathrynn Humble.    Final Clinical Impressions(s) / ED Diagnoses   Final diagnoses:  Palpitations    ED Discharge Orders    None       Corrie Dandy, MD 09/14/17 [place], Ankit, MD 09/16/17 1250

## 2017-09-16 ENCOUNTER — Other Ambulatory Visit: Payer: Self-pay

## 2017-09-16 DIAGNOSIS — R002 Palpitations: Secondary | ICD-10-CM

## 2017-09-29 NOTE — Progress Notes (Signed)
Cardiology Office Note   Date:  10/01/2017   ID:  Jay Combs, DOB [birth year], MRN [id number]  PCP:  [name], MD  Cardiologist:   Jenkins Rouge, MD   No chief complaint on file.     History of Present Illness: Jay Combs is a 62 y.o. male who presents for consultation regarding chest pain. Referred by [facility] ER and Dr [name] He is a friend and previous neighbor I take care of his parents. Last seen by me in 2015 for benign palpitations and atypical chest pain. Myovue normal in 2013 TTE with EF 50-55% no significant valve disease Reviewed ER notes from 09/14/17 Palpitations several days and on day of visit lasted hours. With some chest discomfort Some diaphoresis . In ER on telemetry no arrhythmias. R/O ECG normal no TSH done CXR with mild bronchitic changes with no focal consolidation   Still working with [employer] but business is slow and he needs to be busier He is working out 5 x / week on elliptical with no issues     Past Medical History:  Diagnosis Date  . ALLERGIC RHINITIS 09/25/2006  . BRADYCARDIA 09/25/2006   athletic Heart rate  . CHEST PAIN UNSPECIFIED 10/28/2007   cardiology evaluation including stress test reassuring  . INSOMNIA, CHRONIC 08/09/2008  . PALPITATIONS, CHRONIC 10/28/2007    Past Surgical History:  Procedure Laterality Date  . NASAL SEPTUM SURGERY       Current Outpatient Medications  Medication Sig Dispense Refill  . propranolol (INDERAL) 10 MG tablet Take 1 tablet (10 mg total) by mouth 3 (three) times daily as needed (palpitations). 30 tablet 3   No current facility-administered medications for this visit.     Allergies:   Demerol [meperidine] and Meperidine hcl    Social History:  The patient  reports that he has never smoked. He has never used smokeless tobacco. He reports that he drinks about 5.0 standard drinks of alcohol per week. He reports that he does not use drugs.   Family History:  The patient's family history  includes Aortic aneurysm in his mother; Breast cancer in his mother; Cervical cancer in his mother; Healthy in his son; Heart attack in his paternal grandfather; Lung cancer in his father; Other in his maternal grandmother and son; Scoliosis in his sister.    ROS:  Please see the history of present illness.   Otherwise, review of systems are positive for none.   All other systems are reviewed and negative.    PHYSICAL EXAM: VS:  BP 110/60   Pulse 66   Ht 5\' 7"  (1.702 m)   Wt 143 lb (64.9 kg)   SpO2 99%   BMI 22.40 kg/m  , BMI Body mass index is 22.4 kg/m. Affect appropriate Healthy:  appears stated age 36: normal Neck supple with no adenopathy JVP normal no bruits no thyromegaly Lungs clear with no wheezing and good diaphragmatic motion Heart:  S1/S2 no murmur, no rub, gallop or click PMI normal Abdomen: benighn, BS positve, no tenderness, no AAA no bruit.  No HSM or HJR Distal pulses intact with no bruits No edema Neuro non-focal Skin warm and dry No muscular weakness    EKG:  NSR Rate 78 normal 09/15/17    Recent Labs: 01/12/2017: ALT 11 09/14/2017: BUN 15; Creatinine, Ser 0.99; Hemoglobin 14.4; Platelets 213; Potassium 3.8; Sodium 138    Lipid Panel    Component Value Date/Time   CHOL 213 (H) 01/12/2017 3151  TRIG 57.0 01/12/2017 0842   TRIG 62 12/05/2005 0820   HDL 64.20 01/12/2017 0842   CHOLHDL 3 01/12/2017 0842   VLDL 11.4 01/12/2017 0842   LDLCALC 137 (H) 01/12/2017 0842   LDLDIRECT 117.7 11/19/2009 0757      Wt Readings from Last 3 Encounters:  10/01/17 143 lb (64.9 kg)  09/14/17 143 lb (64.9 kg)  08/12/17 143 lb 12.8 oz (65.2 kg)      Other studies Reviewed: Additional studies/ records that were reviewed today include: notes cardiology 2013, 2015 TTE and myovue notes from ER 09/2017 with labs CXR And ECG .    ASSESSMENT AND PLAN:  1.  Palpitations:  Benign since 2009 noted No history of structural heart disease PRN inderal for now and  observation  2. Chest Pain:  R/O in ER normal ECG discussed options of ETT vs cardiac CT decided to observe for now as he is  Very active at gym with no symptoms  3. Cholesterol:  F/u primary diet Rx  4. GERD:  Continue dexilant low carb diet 5. Anxiety:  Chronic including job stress    Current medicines are reviewed at length with the patient today.  The patient does not have concerns regarding medicines.  The following changes have been made:  Inderal PRN   Labs/ tests ordered today include: none  No orders of the defined types were placed in this encounter.    Disposition:   FU with me in 6-8 weeks    Signed, Jenkins Rouge, MD  10/01/2017 12:32 PM    [facility] [place], [place], [place]  [address] Phone: [phone]; Fax: [phone]

## 2017-09-30 ENCOUNTER — Encounter: Payer: Self-pay | Admitting: Family Medicine

## 2017-10-01 ENCOUNTER — Ambulatory Visit: Payer: No Typology Code available for payment source | Admitting: Cardiovascular Disease

## 2017-10-01 ENCOUNTER — Encounter: Payer: Self-pay | Admitting: Cardiovascular Disease

## 2017-10-01 VITALS — BP 110/60 | HR 66 | Ht 67.0 in | Wt 143.0 lb

## 2017-10-01 DIAGNOSIS — R002 Palpitations: Secondary | ICD-10-CM | POA: Diagnosis not present

## 2017-10-01 MED ORDER — PROPRANOLOL HCL 10 MG PO TABS: 10.0000 mg | ORAL_TABLET | Freq: Three times a day (TID) | ORAL | 3 refills | Status: DC | PRN

## 2017-10-01 NOTE — Patient Instructions (Signed)
Medication Instructions:  Your physician has recommended you make the following change in your medication:  1-START inderal 10 mg by mouth as needed for palpitations   Labwork: NONE  Testing/Procedures: NONE  Follow-Up: Your physician wants you to follow-up in: 6 to 8 weeks  with Dr. [name].    If you need a refill on your cardiac medications before your next appointment, please call your pharmacy.

## 2017-10-09 ENCOUNTER — Encounter: Payer: Self-pay | Admitting: Family Medicine

## 2017-10-13 ENCOUNTER — Other Ambulatory Visit: Payer: Self-pay

## 2017-10-13 DIAGNOSIS — S93402D Sprain of unspecified ligament of left ankle, subsequent encounter: Secondary | ICD-10-CM

## 2017-10-16 ENCOUNTER — Ambulatory Visit: Payer: No Typology Code available for payment source | Admitting: Sports Medicine

## 2017-10-16 ENCOUNTER — Encounter: Payer: Self-pay | Admitting: Sports Medicine

## 2017-10-16 VITALS — BP 102/70 | HR 68 | Ht 67.0 in | Wt 144.0 lb

## 2017-10-16 DIAGNOSIS — M9906 Segmental and somatic dysfunction of lower extremity: Secondary | ICD-10-CM

## 2017-10-16 DIAGNOSIS — S93412A Sprain of calcaneofibular ligament of left ankle, initial encounter: Secondary | ICD-10-CM

## 2017-10-16 NOTE — Progress Notes (Signed)
Juanda Bond. Vernona Peake, [facility] [place]  Marciano Mundt - 62 y.o. male MRN [id number]  Date of birth: 12-Feb-1955  Visit Date: 10/16/2017  PCP: [name], MD   Referred by: [name], MD  Scribe(s) for today's visit: Josepha Pigg, CMA  SUBJECTIVE:  Jay Combs is here for Initial Assessment (L ankle pain) .  Referred by: Dr. Garret Reddish  His L ankle pain/sprain symptoms INITIALLY: Began early May 2019 after twisting ankle while doing yard work.  Described as mild tightness, non-radiating. Worsened with crossing L leg under R while sitting on couch.  Improved with rest.  Additional associated symptoms include: Pain is lateral. Initially there was swelling present around the ankle but that has resolved. He has "tinge" of pain when making a "wrong step" walking.     At this time symptoms are improving compared to onset. Initially he was using compression and ice with some relief. He is not currently taking any medication or using other modalities for the pain.   No recent imaging of ankle.    REVIEW OF SYSTEMS: Denies night time disturbances. Denies fevers, chills, or night sweats. Denies unexplained weight loss. Denies personal history of cancer. Denies changes in bowel or bladder habits. Denies recent unreported falls. Denies new or worsening dyspnea or wheezing. Denies headaches or dizziness.  Denies numbness, tingling or weakness  In the extremities.  Denies dizziness or presyncopal episodes Denies lower extremity edema     HISTORY:  Prior history reviewed and updated per electronic medical record.  Social History   Occupational History  . Occupation: Games developer: [employer]  Tobacco Use  . Smoking status: Never Smoker  . Smokeless tobacco: Never Used  Substance and Sexual Activity  . Alcohol use: Yes    Alcohol/week: 5.0 standard drinks    Types: 5 Glasses of  wine per week    Comment: 3-4 glasses per week, then some wine on weekend  . Drug use: No  . Sexual activity: Not on file   Social History   Social History Narrative   Married. 2 sons 52 and 57 in 2018. No grandkids yet. Has some grandcats.         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 ALLERGIC RHINITIS 09/25/2006  . BRADYCARDIA 09/25/2006   athletic Heart rate  . CHEST PAIN UNSPECIFIED 10/28/2007   cardiology evaluation including stress test reassuring  . INSOMNIA, CHRONIC 08/09/2008  . PALPITATIONS, CHRONIC 10/28/2007   Past Surgical History:  Procedure Laterality Date  . NASAL SEPTUM SURGERY     family history includes Aortic aneurysm in his mother; Breast cancer in his mother; Cervical cancer in his mother; Healthy in his son; Heart attack in his paternal grandfather; Lung cancer in his father; Other in his maternal grandmother and son; Scoliosis in his sister.  DATA OBTAINED & REVIEWED:  No results for input(s): HGBA1C, LABURIC, CREATINE in the last 8760 hours. .   OBJECTIVE:  VS:  HT:5\' 7"  (170.2 cm)   WT:144 lb (65.3 kg)  BMI:22.55    BP:102/70  HR:68bpm  TEMP: ( )  RESP:97 %   PHYSICAL EXAM: CONSTITUTIONAL: Well-developed, Well-nourished and In  no acute distress PSYCHIATRIC: Alert & appropriately interactive. and Not depressed or anxious appearing. RESPIRATORY: No increased work of breathing and Trachea Midline EYES: Pupils are equal., EOM intact without nystagmus. and No scleral icterus.  VASCULAR EXAM: Warm and well perfused NEURO: unremarkable  MSK Exam: Left ankle  Well aligned, no significant deformity. No overlying skin changes. No focal bony tenderness   RANGE OF MOTION & STRENGTH  Full dorsiflexion and plantarflexion with the exception of  functional restriction per osteopathic exam.  Small amount of pain over the left lateral anterior ankle.   SPECIALITY TESTING:  Ankle drawer testing is stable.  Talar tilt is normal.  Klieger testing is normal.     ASSESSMENT   1. Sprain of calcaneofibular ligament of left ankle, initial encounter   2. Somatic dysfunction of lower extremity     PLAN:  Pertinent additional documentation may be included in corresponding procedure notes, imaging studies, problem based documentation and patient instructions.  Procedures:  . Osteopathic manipulation was performed today based on physical exam findings.  Please see procedure note for further information including Osteopathic Exam findings . Discussed the foundation of treatment for this condition is physical therapy and/or daily (5-6 days/week) therapeutic exercises, focusing on core strengthening, coordination, neuromuscular control/reeducation.  Therapeutic exercises prescribed per procedure note.  Medications:  No orders of the defined types were placed in this encounter.  Discussion/Instructions: No problem-specific Assessment & Plan notes found for this encounter.  Marland Kitchen Anterior ankle sprain with persistent somatic dysfunction that responded well.  He had good improvement with osteopathic manipulation.  He should work on gentle dorsiflexion and plantarflexion as well as inversion and eversion strengthening but otherwise should do well with continued conservative management. . Discussed red flag symptoms that warrant earlier emergent evaluation and patient voices understanding. . Activity modifications and the importance of avoiding exacerbating activities (limiting pain to no more than a 4 / 10 during or following activity) recommended and discussed.  Follow-up:  . No follow-ups on file.   . If any lack of improvement consider: further diagnostic evaluation with Plain film x-rays and ultrasound.  .      CMA/ATC served as Education administrator  during this visit. History, Physical, and Plan performed by medical provider. Documentation and orders reviewed and attested to.      Gerda Diss, [facility] Physician

## 2017-10-16 NOTE — Patient Instructions (Addendum)
Please perform the exercise program that we have prepared for you and gone over in detail on a daily basis.  In addition to the handout you were provided you can access your program through: [email or web address]   Your unique program code is: [email or web address]

## 2017-10-27 ENCOUNTER — Encounter: Payer: Self-pay | Admitting: Sports Medicine

## 2017-10-27 NOTE — Progress Notes (Signed)
PROCEDURE NOTE: THERAPEUTIC EXERCISES (97110) 15 minutes spent for Therapeutic exercises as below and as referenced in the AVS.  This included exercises focusing on stretching, strengthening, with significant focus on eccentric aspects.   Proper technique shown and discussed handout in great detail with ATC.  All questions were discussed and answered.   Long term goals include an improvement in range of motion, strength, endurance as well as avoiding reinjury. Frequency of visits is one time as determined during today's  office visit. Frequency of exercises to be performed is as per handout.  EXERCISES REVIEWED:  4 way ANKLE exercises   Proprioception exercises

## 2017-10-27 NOTE — Progress Notes (Signed)
PROCEDURE NOTE : OSTEOPATHIC MANIPULATION The decision today to treat with Osteopathic Manipulative Therapy (OMT) was based on physical exam findings. Verbal consent was obtained following a discussion with the patient regarding the of risks, benefits and potential side effects, including an acute pain flare,post manipulation soreness and need for repeat treatments.     Contraindications to OMT: NONE  Manipulation was performed as below: Regions Treated OMT Techniques Used  Lower extremities HVLA myofascial release articulatory   The patient tolerated the treatment well and reported Improved symptoms following treatment today. Patient was given medications, exercises, stretches and lifestyle modifications per AVS and verbally.   OSTEOPATHIC/STRUCTURAL EXAM:   Left anterior tibia on talus Left posterior fibular head

## 2017-11-24 ENCOUNTER — Encounter: Payer: Self-pay | Admitting: Sports Medicine

## 2017-11-25 ENCOUNTER — Ambulatory Visit: Payer: No Typology Code available for payment source | Admitting: Sports Medicine

## 2017-11-27 ENCOUNTER — Ambulatory Visit: Payer: No Typology Code available for payment source | Admitting: Sports Medicine

## 2017-11-27 DIAGNOSIS — Z0289 Encounter for other administrative examinations: Secondary | ICD-10-CM

## 2017-11-30 ENCOUNTER — Encounter: Payer: Self-pay | Admitting: Sports Medicine

## 2017-12-14 ENCOUNTER — Ambulatory Visit (INDEPENDENT_AMBULATORY_CARE_PROVIDER_SITE_OTHER): Payer: No Typology Code available for payment source

## 2017-12-14 ENCOUNTER — Encounter: Payer: Self-pay | Admitting: Sports Medicine

## 2017-12-14 ENCOUNTER — Ambulatory Visit: Payer: No Typology Code available for payment source | Admitting: Sports Medicine

## 2017-12-14 ENCOUNTER — Ambulatory Visit: Payer: Self-pay

## 2017-12-14 VITALS — BP 110/76 | HR 67 | Ht 67.0 in | Wt 144.4 lb

## 2017-12-14 DIAGNOSIS — G8929 Other chronic pain: Secondary | ICD-10-CM

## 2017-12-14 DIAGNOSIS — M25872 Other specified joint disorders, left ankle and foot: Secondary | ICD-10-CM

## 2017-12-14 DIAGNOSIS — M25572 Pain in left ankle and joints of left foot: Secondary | ICD-10-CM

## 2017-12-14 NOTE — Procedures (Signed)
PROCEDURE NOTE:  Ultrasound Guided: Injection: Left ankle Images were obtained and interpreted by myself, Teresa Coombs, DO  Images have been saved and stored to PACS system. Images obtained on: GE S7 Ultrasound machine    ULTRASOUND FINDINGS:  Soft tissue swelling  DESCRIPTION OF PROCEDURE:  The patient's clinical condition is marked by substantial pain and/or significant functional disability. Other conservative therapy has not provided relief, is contraindicated, or not appropriate. There is a reasonable likelihood that injection will significantly improve the patient's pain and/or functional impairment.   After discussing the risks, benefits and expected outcomes of the injection and all questions were reviewed and answered, the patient wished to undergo the above named procedure.  Verbal consent was obtained.  The ultrasound was used to identify the target structure and adjacent neurovascular structures. The skin was then prepped in sterile fashion and the target structure was injected under direct visualization using sterile technique as below:  Single injection performed as below: PREP: Alcohol and Ethel Chloride APPROACH:anteriolateral, single injection, 25g 1.5 in. INJECTATE: 1 cc 0.5% Marcaine and 1 cc 40mg /mL DepoMedrol ASPIRATE: None DRESSING: Band-Aid  Post procedural instructions including recommending icing and warning signs for infection were reviewed.    This procedure was well tolerated and there were no complications.   IMPRESSION: Succesful Ultrasound Guided: Injection

## 2017-12-14 NOTE — Patient Instructions (Addendum)

## 2017-12-14 NOTE — Progress Notes (Signed)
Juanda Bond. Esther Broyles, [place] at [place]  Cora Stetson - 62 y.o. male MRN [id number]  Date of birth: 15-Dec-1955  Visit Date: 12/14/2017  PCP: [name], MD   Referred by: [name], MD  Scribe(s) for today's visit: Wendy Poet, LAT, ATC  SUBJECTIVE:  Jakaleb Payer is here for Follow-up (L ankle sprain)   His L ankle pain/sprain symptoms INITIALLY: Began early May 2019 after twisting ankle while doing yard work.  Described as mild tightness, non-radiating. Worsened with crossing L leg under R while sitting on couch.  Improved with rest.  Additional associated symptoms include: Pain is lateral. Initially there was swelling present around the ankle but that has resolved. He has "tinge" of pain when making a "wrong step" walking.     At this time symptoms are improving compared to onset. Initially he was using compression and ice with some relief. He is not currently taking any medication or using other modalities for the pain.   No recent imaging of ankle.   12/14/2017: Compared to the last office visit on 10/16/17, his previously described L ankle symptoms show no change  Current symptoms are mild tightness along the L lateral ankle & are nonradiating.  He also notes mild pain at times w/ random movements/activities such as putting on his shoes. He has stopped doing his HEP consisting of 4-way ankle and proprioception.  Notes that he was inconsistent w/ these and only did them for a few weeks.  REVIEW OF SYSTEMS: Denies night time disturbances. Denies fevers, chills, or night sweats. Denies unexplained weight loss. Denies personal history of cancer. Denies changes in bowel or bladder habits. Denies recent unreported falls. Denies new or worsening dyspnea or wheezing. Denies headaches or dizziness.  Denies numbness, tingling or weakness  In the extremities.  Denies dizziness or presyncopal episodes Denies  lower extremity edema     HISTORY:  Prior history reviewed and updated per electronic medical record.  Social History   Occupational History  . Occupation: Games developer: [employer]  Tobacco Use  . Smoking status: Never Smoker  . Smokeless tobacco: Never Used  Substance and Sexual Activity  . Alcohol use: Yes    Alcohol/week: 5.0 standard drinks    Types: 5 Glasses of wine per week    Comment: 3-4 glasses per week, then some wine on weekend  . Drug use: No  . Sexual activity: Not on file   Social History   Social History Narrative   Married. 2 sons 72 and 32 in 2018. No grandkids yet. Has some grandcats.         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 ALLERGIC RHINITIS 09/25/2006  . BRADYCARDIA 09/25/2006   athletic Heart rate  . CHEST PAIN UNSPECIFIED 10/28/2007   cardiology evaluation including stress test reassuring  . INSOMNIA, CHRONIC 08/09/2008  . PALPITATIONS, CHRONIC 10/28/2007   Past Surgical History:  Procedure Laterality Date  . NASAL SEPTUM SURGERY     family history includes Aortic aneurysm in his mother; Breast cancer in his mother; Cervical cancer in his mother; Healthy in his son; Heart attack in his paternal grandfather; Lung cancer in his  father; Other in his maternal grandmother and son; Scoliosis in his sister.  DATA OBTAINED & REVIEWED:  No results for input(s): HGBA1C, LABURIC, CREATINE in the last 8760 hours. .   OBJECTIVE:  VS:  HT:5\' 7"  (170.2 cm)   WT:144 lb 6.4 oz (65.5 kg)  BMI:22.61    BP:110/76  HR:67bpm  TEMP: ( )  RESP:98 %   PHYSICAL EXAM: CONSTITUTIONAL: Well-developed, Well-nourished and In no acute distress PSYCHIATRIC: Alert & appropriately interactive. and Not depressed or anxious appearing. RESPIRATORY: No  increased work of breathing and Trachea Midline EYES: Pupils are equal., EOM intact without nystagmus. and No scleral icterus.  VASCULAR EXAM: Warm and well perfused NEURO: unremarkable  MSK Exam: Left ankle  Well aligned, no significant deformity. No overlying skin changes. No focal bony tenderness   RANGE OF MOTION & STRENGTH  Full dorsiflexion and plantarflexion with the exception of functional restriction per osteopathic exam.  Small amount of pain over the left lateral anterior ankle.   SPECIALITY TESTING:  Ankle drawer testing is stable.  Talar tilt is normal.  Klieger testing is normal.     ASSESSMENT   1. Chronic pain of left ankle   2. Impingement of left ankle joint     PLAN:  Pertinent additional documentation may be included in corresponding procedure notes, imaging studies, problem based documentation and patient instructions.  Procedures:  US Guided Injection per procedure note   Medications:  No orders of the defined types were placed in this encounter.  Discussion/Instructions: No problem-specific Assessment & Plan notes found for this encounter.  . Ankle impingement on exam today.  Should do well with intra-articular injection performed today for him. . X-rays reviewed that show old small avulsion fracture.  Follow-up:  . Return in about 6 weeks (around 01/25/2018).       CMA/ATC served as Education administrator during this visit. History, Physical, and Plan performed by medical provider. Documentation and orders reviewed and attested to.      Gerda Diss, [facility] Physician

## 2017-12-22 ENCOUNTER — Encounter: Payer: Self-pay | Admitting: Sports Medicine

## 2017-12-25 ENCOUNTER — Ambulatory Visit: Payer: No Typology Code available for payment source | Admitting: Cardiovascular Disease

## 2018-01-26 ENCOUNTER — Ambulatory Visit: Payer: No Typology Code available for payment source | Admitting: Sports Medicine

## 2018-01-28 ENCOUNTER — Ambulatory Visit: Payer: No Typology Code available for payment source | Admitting: Sports Medicine

## 2018-03-06 ENCOUNTER — Encounter: Payer: Self-pay | Admitting: Sports Medicine

## 2018-05-05 LAB — HEPATIC FUNCTION PANEL
ALT: 11 (ref 10–40)
AST: 18 (ref 14–40)
Alkaline Phosphatase: 49 (ref 25–125)
Bilirubin, Total: 0.8

## 2018-05-05 LAB — COMPREHENSIVE METABOLIC PANEL
Albumin: 4.2 (ref 3.5–5.0)
Globulin: 2.4

## 2018-05-05 LAB — LIPID PANEL
Cholesterol: 215 — AB (ref 0–200)
HDL: 73 — AB (ref 35–70)
LDL Cholesterol: 124
LDl/HDL Ratio: 1.7
Triglycerides: 86 (ref 40–160)

## 2018-05-05 LAB — HM HIV SCREENING LAB: HM HIV Screening: NEGATIVE

## 2018-05-05 LAB — BASIC METABOLIC PANEL
BUN: 18 (ref 4–21)
Creatinine: 1 (ref 0.6–1.3)
Glucose: 91

## 2018-05-05 LAB — HEMOGLOBIN A1C: Hemoglobin A1C: 5.4

## 2018-05-05 LAB — HIV ANTIBODY (ROUTINE TESTING W REFLEX): HIV 1&2 Ab, 4th Generation: NEGATIVE

## 2018-05-14 ENCOUNTER — Encounter: Payer: Self-pay | Admitting: Family Medicine

## 2018-08-19 ENCOUNTER — Encounter: Payer: Self-pay | Admitting: Family Medicine

## 2018-09-07 ENCOUNTER — Ambulatory Visit: Payer: No Typology Code available for payment source | Admitting: Physician Assistant

## 2018-09-07 ENCOUNTER — Encounter: Payer: Self-pay | Admitting: Physician Assistant

## 2018-09-07 ENCOUNTER — Other Ambulatory Visit: Payer: Self-pay

## 2018-09-07 VITALS — BP 110/68 | HR 65 | Temp 98.3°F | Ht 67.0 in | Wt 147.0 lb

## 2018-09-07 DIAGNOSIS — W57XXXA Bitten or stung by nonvenomous insect and other nonvenomous arthropods, initial encounter: Secondary | ICD-10-CM | POA: Diagnosis not present

## 2018-09-07 DIAGNOSIS — S40862A Insect bite (nonvenomous) of left upper arm, initial encounter: Secondary | ICD-10-CM

## 2018-09-07 MED ORDER — DOXYCYCLINE HYCLATE 100 MG PO TABS: 100.0000 mg | ORAL_TABLET | Freq: Two times a day (BID) | ORAL | 0 refills | Status: DC

## 2018-09-07 NOTE — Patient Instructions (Signed)
It was great to see you!  If you feel like your symptoms are not improving or if they are worsening, start the oral doxycycline and keep [place] posted!  If you develop any fevers/chills or significantly worsening body aches, please go to the ER.  Take care,  Inda Coke PA-C   Insect Bite, Adult An insect bite can make your skin red, itchy, and swollen. An insect bite is different from an insect sting, which happens when an insect injects poison (venom) into the skin. Some insects can spread disease to people through a bite. However, most insect bites do not lead to disease and are not serious. What are the causes? Insects may bite for a variety of reasons, including:  Hunger.  To defend themselves. Insects that bite include:  Spiders.  Mosquitoes.  Ticks.  Fleas.  Ants.  Flies.  Kissing bugs.  Chiggers. What are the signs or symptoms? Symptoms of this condition include:  Itching or pain in the bite area.  Redness and swelling in the bite area.  An open wound (skin ulcer). In many cases, symptoms last for 2-4 days. In rare cases, a person may have a severe allergic reaction (anaphylactic reaction) to a bite. Symptoms of an anaphylactic reaction may include:  Feeling warm in the face (flushed). This may include redness.  Itchy, red, swollen areas of skin (hives).  Swelling of the eyes, lips, face, mouth, tongue, or throat.  Difficulty breathing, speaking, or swallowing.  Noisy breathing (wheezing).  Dizziness or light-headedness.  Fainting.  Pain or cramping in the abdomen.  Vomiting.  Diarrhea. How is this diagnosed? This condition is usually diagnosed based on symptoms and a physical exam. How is this treated? Treatment is usually not needed. Symptoms often go away on their own. When treatment is recommended, it may involve:  Applying a cream or lotion to the bite area. This treatment helps with itching.  Taking an antibiotic medicine. This  treatment is needed if the bite area gets infected.  Getting a tetanus shot, if you are not up to date on this vaccine.  Applying ice to the affected area.  Allergy medicines called antihistamines. This treatment may be needed if you develop itching or an allergic reaction to the insect bite.  Giving yourself an epinephrine injection if you have an anaphylactic reaction to a bite. To give the injection, you will use what is commonly called an auto-injector "pen" (pre-filled automatic epinephrine injection device). Your health care provider will teach you how to use an auto-injector pen. Follow these instructions at home: Bite area care   Do not scratch the bite area.  Keep the bite area clean and dry. Wash it every day with soap and water as told by your health care provider.  Check the bite area every day for signs of infection. Check for: ? Redness, swelling, or pain. ? Fluid or blood. ? Warmth. ? Pus or a bad smell. Managing pain, itching, and swelling   You may apply cortisone cream, calamine lotion, or a paste made of baking soda and water to the bite area as told by your health care provider.  If directed, put ice on the bite area. ? Put ice in a plastic bag. ? Place a towel between your skin and the bag. ? Leave the ice on for 20 minutes, 2-3 times a day. General instructions  Apply or take over-the-counter and prescription medicines only as told by your health care provider.  If you were prescribed an antibiotic medicine,  take or apply it as told by your health care provider. Do not stop using the antibiotic even if your condition improves.  Keep all follow-up visits as told by your health care provider. This is important. How is this prevented? To help reduce your risk of insect bites:  When you are outdoors, wear clothing that covers your arms and legs. This is especially important in the early morning and evening.  Use insect repellent. The best insect repellents  contain DEET, picaridin, oil of lemon eucalyptus (OLE), or IR3535.  Consider spraying your clothing with a pesticide called permethrin. Permethrin helps prevent insect bites. It works for several weeks and for up to 5-6 clothing washes. Do not apply permethrin directly to the skin.  If your home windows do not have screens, consider installing them.  If you will be sleeping in an area where there are mosquitoes, consider covering your sleeping area with a mosquito net. Contact a health care provider if:  You have redness, swelling, or pain in the bite area.  You have fluid or blood coming from the bite area.  The bite area feels warm to the touch.  You have pus or a bad smell coming from the bite area.  You have a fever. Get help right away if:  You have joint pain.  You have a rash.  You feel unusually tired or sleepy.  You have neck pain.  You have a headache.  You have unusual weakness.  You develop symptoms of an anaphylactic reaction. These may include: ? Flushed skin. ? Hives. ? Swelling of the eyes, lips, face, mouth, tongue, or throat. ? Difficulty breathing, speaking, or swallowing. ? Wheezing. ? Dizziness or light-headedness. ? Fainting. ? Pain or cramping in the abdomen. ? Vomiting. ? Diarrhea. These symptoms may represent a serious problem that is an emergency. Do not wait to see if the symptoms will go away. Do the following right away:  Use the auto-injector pen as you have been instructed.  Get medical help. Call your local emergency services (911 in the [place] Do not drive yourself to the hospital. Summary  An insect bite can make your skin red, itchy, and swollen.  Treatment is usually not needed. Symptoms often go away on their own. When treatment is recommended, it may involve taking medicine, applying medicine to the area, or applying ice.  Apply or take over-the-counter and prescription medicines only as told by your health care provider.   Use insect repellent to help prevent insect bites.  Contact a health care provider if you have any signs of infection in the bite area. This information is not intended to replace advice given to you by your health care provider. Make sure you discuss any questions you have with your health care provider. Document Released: 03/06/2004 Document Revised: 08/07/2017 Document Reviewed: 08/07/2017 Elsevier Patient Education  2020 [employer].

## 2018-09-07 NOTE — Progress Notes (Signed)
Jay Combs is a 63 y.o. male here for a new problem.  I acted as a Education administrator for [employer], PA-C Jay Pickler, LPN  History of Present Illness:   Chief Complaint  Patient presents with  . Insect Bite    HPI   Insect bite Pt was bit 4-5 days ago on left upper arm -- he is unsure of what bit him. The area became red and swollen. Denies: fevers, chills, n/v, discharge from area, neck stiffness, fatigue. Area today is just slightly red today and is significantly improved. He is having achy thighs all the way down to knees and having a little muscle pain in bilateral LE -- but he does state that he has been doing more "intense" workouts recently.  Has not tried anything for his symptoms.    Past Medical History:  Diagnosis Date  . ALLERGIC RHINITIS 09/25/2006  . BRADYCARDIA 09/25/2006   athletic Heart rate  . CHEST PAIN UNSPECIFIED 10/28/2007   cardiology evaluation including stress test reassuring  . INSOMNIA, CHRONIC 08/09/2008  . PALPITATIONS, CHRONIC 10/28/2007     Social History   Socioeconomic History  . Marital status: Married    Spouse name: Not on file  . Number of children: 2  . Years of education: Not on file  . Highest education level: Not on file  Occupational History  . Occupation: Games developer: [employer]  Social Needs  . Financial resource strain: Not on file  . Food insecurity    Worry: Not on file    Inability: Not on file  . Transportation needs    Medical: Not on file    Non-medical: Not on file  Tobacco Use  . Smoking status: Never Smoker  . Smokeless tobacco: Never Used  Substance and Sexual Activity  . Alcohol use: Yes    Alcohol/week: 5.0 standard drinks    Types: 5 Glasses of wine per week    Comment: 3-4 glasses per week, then some wine on weekend  . Drug use: No  . Sexual activity: Not on file  Lifestyle  . Physical activity    Days per week: Not on file    Minutes per session: Not on file  . Stress: Not on  file  Relationships  . Social Herbalist on phone: Not on file    Gets together: Not on file    Attends religious service: Not on file    Active member of club or organization: Not on file    Attends meetings of clubs or organizations: Not on file    Relationship status: Not on file  . Intimate partner violence    Fear of current or ex partner: Not on file    Emotionally abused: Not on file    Physically abused: Not on file    Forced sexual activity: Not on file  Other Topics Concern  . Not on file  Social History Narrative   Married. 2 sons 58 and 45 in 2018. No grandkids yet. Has some grandcats.         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 NASAL SEPTUM SURGERY      Family History  Problem Relation Age of Onset  .  Aortic aneurysm Mother        never smoker  . Breast cancer Mother        stage I- mastectomy  . Cervical cancer Mother        ? uterine.   . Lung cancer Father        smoker. chemo, radiation. surgery not available- spots shrunk some. depressed mood  . Scoliosis Sister        also some head trauma after abuse from spouse  . Other Son        X-linked gammaglobulinemia. monthly infusions  . Healthy Son   . Other Maternal Grandmother        unilateral kidney but lived to 48  . Heart attack Paternal Grandfather        younger than 59- but patients dad has not had issues    Allergies  Allergen Reactions  . Demerol [Meperidine] Other (See Comments)    Decreased BP  . Meperidine Hcl     REACTION: decreased BP    Current Medications:   Current Outpatient Medications:  .  doxycycline (VIBRA-TABS) 100 MG tablet, Take 1 tablet (100 mg total) by mouth 2 (two) times daily., Disp: 20 tablet, Rfl: 0   Review of Systems:    ROS Negative unless otherwise specified per HPI.  Vitals:   Vitals:   09/07/18 0839  BP: 110/68  Pulse: 65  Temp: 98.3 F (36.8 C)  TempSrc: Temporal  SpO2: 99%  Weight: 147 lb (66.7 kg)  Height: 5\' 7"  (1.702 m)     Body mass index is 23.02 kg/m.  Physical Exam:   Physical Exam Vitals signs and nursing note reviewed.  Constitutional:      General: He is not in acute distress.    Appearance: He is well-developed. He is not ill-appearing or toxic-appearing.  Cardiovascular:     Rate and Rhythm: Normal rate and regular rhythm.     Pulses: Normal pulses.     Heart sounds: Normal heart sounds, S1 normal and S2 normal.     Comments: No LE edema Pulmonary:     Effort: Pulmonary effort is normal.     Breath sounds: Normal breath sounds.  Skin:    General: Skin is warm and dry.     Comments: Very faint circular erythematous area approximately 6cm x 7cm on L shoulder with pinpoint center, no active discharge. No warmth, TTP.  Neurological:     Mental Status: He is alert.     GCS: GCS eye subscore is 4. GCS verbal subscore is 5. GCS motor subscore is 6.  Psychiatric:        Speech: Speech normal.        Behavior: Behavior normal. Behavior is cooperative.        Assessment and Plan:   [name] was seen today for insect bite.  Diagnoses and all orders for this visit:  Insect bite of left upper arm, initial encounter  Other orders -     doxycycline (VIBRA-TABS) 100 MG tablet; Take 1 tablet (100 mg total) by mouth 2 (two) times daily.   Overall improving appearance. Discussed that with the presence of myalgias, cannot be certain this is not from over exercising vs bug bite. Gave option of starting oral doxycycline now or waiting to see if myalgias improve. He would like to wait, prescription given. Worsening precautions advised.  . Reviewed expectations re: course of current medical issues. . Discussed self-management of symptoms. . Outlined signs and symptoms  indicating  need for more acute intervention. . Patient verbalized understanding and all questions were answered. . See orders for this visit as documented in the electronic medical record. . Patient received an After-Visit Summary.  CMA or LPN served as scribe during this visit. History, Physical, and Plan performed by medical provider. The above documentation has been reviewed and is accurate and complete.  Inda Coke, PA-C

## 2018-09-20 ENCOUNTER — Encounter: Payer: Self-pay | Admitting: Gastroenterology

## 2018-09-22 ENCOUNTER — Telehealth: Payer: Self-pay

## 2018-09-22 NOTE — Telephone Encounter (Signed)
Copied from [place] [id number]. Topic: General - Other >> Sep 22, 2018  8:05 AM Leward Quan A wrote: Reason for CRM: Patient would like a call back to schedule an appointment. Please call Ph# [phone]

## 2018-09-27 NOTE — Patient Instructions (Addendum)
Health Maintenance Due  Topic Date Due  . INFLUENZA VACCINE  09/11/2018  We should have flu shots available by September. Please strongly consider getting flu shot this year. If you get your flu shot at a pharmacy- please let us know.   We will call you within two weeks about your referral to Dr. Tamala Julian- for left leg as well as left ankle issues. If you do not hear within 3 weeks, give [place] a call.   Lets have you try to get back to your tapping Try alprazolam sparingly for sleep issues Hopefully between these 2 we can lower anxiety levels- you can return to Mr. Petrini if needed.  We also discussed options like SSRI/antidepressants more for anti anxiety component like lexapro 5 mg   Schedule 3-6 month physical or see [place] sooner if needed    Escitalopram tablets What is this medicine? ESCITALOPRAM (es sye TAL oh pram) is used to treat depression and certain types of anxiety. This medicine may be used for other purposes; ask your health care provider or pharmacist if you have questions. COMMON BRAND NAME(S): Lexapro What should I tell my health care provider before I take this medicine? They need to know if you have any of these conditions:  bipolar disorder or a family history of bipolar disorder  diabetes  glaucoma  heart disease  kidney or liver disease  receiving electroconvulsive therapy  seizures (convulsions)  suicidal thoughts, plans, or attempt by you or a family member  an unusual or allergic reaction to escitalopram, the related drug citalopram, other medicines, foods, dyes, or preservatives  pregnant or trying to become pregnant  breast-feeding How should I use this medicine? Take this medicine by mouth with a glass of water. Follow the directions on the prescription label. You can take it with or without food. If it upsets your stomach, take it with food. Take your medicine at regular intervals. Do not take it more often than directed. Do not stop taking this  medicine suddenly except upon the advice of your doctor. Stopping this medicine too quickly may cause serious side effects or your condition may worsen. A special MedGuide will be given to you by the pharmacist with each prescription and refill. Be sure to read this information carefully each time. Talk to your pediatrician regarding the use of this medicine in children. Special care may be needed. Overdosage: If you think you have taken too much of this medicine contact a poison control center or emergency room at once. NOTE: This medicine is only for you. Do not share this medicine with others. What if I miss a dose? If you miss a dose, take it as soon as you can. If it is almost time for your next dose, take only that dose. Do not take double or extra doses. What may interact with this medicine? Do not take this medicine with any of the following medications:  certain medicines for fungal infections like fluconazole, itraconazole, ketoconazole, posaconazole, voriconazole  cisapride  citalopram  dronedarone  linezolid  MAOIs like Carbex, Eldepryl, Marplan, Nardil, and Parnate  methylene blue (injected into a vein)  pimozide  thioridazine This medicine may also interact with the following medications:  alcohol  amphetamines  aspirin and aspirin-like medicines  carbamazepine  certain medicines for depression, anxiety, or psychotic disturbances  certain medicines for migraine headache like almotriptan, eletriptan, frovatriptan, naratriptan, rizatriptan, sumatriptan, zolmitriptan  certain medicines for sleep  certain medicines that treat or prevent blood clots like warfarin, enoxaparin, dalteparin  cimetidine  diuretics  dofetilide  fentanyl  furazolidone  isoniazid  lithium  metoprolol  NSAIDs, medicines for pain and inflammation, like ibuprofen or naproxen  other medicines that prolong the QT interval (cause an abnormal heart  rhythm)  procarbazine  rasagiline  supplements like St. John's wort, kava kava, valerian  tramadol  tryptophan  ziprasidone This list may not describe all possible interactions. Give your health care provider a list of all the medicines, herbs, non-prescription drugs, or dietary supplements you use. Also tell them if you smoke, drink alcohol, or use illegal drugs. Some items may interact with your medicine. What should I watch for while using this medicine? Tell your doctor if your symptoms do not get better or if they get worse. Visit your doctor or health care professional for regular checks on your progress. Because it may take several weeks to see the full effects of this medicine, it is important to continue your treatment as prescribed by your doctor. Patients and their families should watch out for new or worsening thoughts of suicide or depression. Also watch out for sudden changes in feelings such as feeling anxious, agitated, panicky, irritable, hostile, aggressive, impulsive, severely restless, overly excited and hyperactive, or not being able to sleep. If this happens, especially at the beginning of treatment or after a change in dose, call your health care professional. Dennis Bast may get drowsy or dizzy. Do not drive, use machinery, or do anything that needs mental alertness until you know how this medicine affects you. Do not stand or sit up quickly, especially if you are an older patient. This reduces the risk of dizzy or fainting spells. Alcohol may interfere with the effect of this medicine. Avoid alcoholic drinks. Your mouth may get dry. Chewing sugarless gum or sucking hard candy, and drinking plenty of water may help. Contact your doctor if the problem does not go away or is severe. What side effects may I notice from receiving this medicine? Side effects that you should report to your doctor or health care professional as soon as possible:  allergic reactions like skin rash,  itching or hives, swelling of the face, lips, or tongue  anxious  black, tarry stools  changes in vision  confusion  elevated mood, decreased need for sleep, racing thoughts, impulsive behavior  eye pain  fast, irregular heartbeat  feeling faint or lightheaded, falls  feeling agitated, angry, or irritable  hallucination, loss of contact with reality  loss of balance or coordination  loss of memory  painful or prolonged erections  restlessness, pacing, inability to keep still  seizures  stiff muscles  suicidal thoughts or other mood changes  trouble sleeping  unusual bleeding or bruising  unusually weak or tired  vomiting Side effects that usually do not require medical attention (report to your doctor or health care professional if they continue or are bothersome):  changes in appetite  change in sex drive or performance  headache  increased sweating  indigestion, nausea  tremors This list may not describe all possible side effects. Call your doctor for medical advice about side effects. You may report side effects to FDA at 1-800-FDA-1088. Where should I keep my medicine? Keep out of reach of children. Store at room temperature between 15 and 30 degrees C (59 and 86 degrees F). Throw away any unused medicine after the expiration date. NOTE: This sheet is a summary. It may not cover all possible information. If you have questions about this medicine, talk to your doctor,  pharmacist, or health care provider.  2020 Elsevier/Gold Standard (2018-01-18 11:21:44)

## 2018-09-27 NOTE — Progress Notes (Signed)
Phone [phone]   Subjective:  Jay Combs is a 63 y.o. year old very pleasant male patient who presents for/with See problem oriented charting Chief Complaint  Patient presents with  . Acute Visit  . Leg Pain   ROS-no fever/chills/cough/congestion reported.  Does report anxiety and racing thoughts.  Past Medical History-  Patient Active Problem List   Diagnosis Date Noted  . GERD (gastroesophageal reflux disease) 01/12/2017    Priority: Medium  . INSOMNIA, CHRONIC 08/09/2008    Priority: Medium  . PALPITATIONS, CHRONIC 10/28/2007    Priority: Medium  . Macular pucker of both eyes 01/12/2017    Priority: Low  . Allergic rhinitis 09/25/2006    Priority: Low  . Anxiety 08/12/2017  . Hyperlipidemia 01/12/2017  . Ptosis, right 01/12/2017    Medications- reviewed and updated Current Outpatient Medications  Medication Sig Dispense Refill  . ALPRAZolam (XANAX) 0.25 MG tablet Take 1 tablet (0.25 mg total) by mouth 2 (two) times daily as needed for anxiety or sleep (don't drive for 6 hours after taking). 30 tablet 0   No current facility-administered medications for this visit.      Objective:  BP 110/80 (BP Location: Left Arm, Patient Position: Sitting, Cuff Size: Normal)   Pulse 65   Temp 98.2 F (36.8 C) (Oral)   Ht 5\' 7"  (1.702 m)   Wt 147 lb 9.6 oz (67 kg)   SpO2 98%   BMI 23.12 kg/m  Gen: NAD, resting comfortably CV: RRR no murmurs rubs or gallops Lungs: CTAB no crackles, wheeze, rhonchi Abdomen: soft/nontender/nondistended/normal bowel sounds.  Skin: warm, dry Neuro: 5 out of 5 muscle strength lower extremities MSK: Normal straight leg raise, normal range of motion in the hips without pain, no pain with palpation over greater trochanters. Does have some mild tenderness with palpation over right quadriceps, mild to moderate tenderness over left quadriceps- seems to worsen with motion but no clear weakness once again.  Mild tenderness over peds anserine bursa     Assessment and Plan    #Anxiety/depressed mood/poor sleep/stress S: Patient complains of ongoing anxiety/stress.  In particular a lot of stress related to work.  Years ago he lost his job and within 2 years-Rehired by old company and 18 months later fired again. 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 A lot of stress with that going on. 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  Falls asleep at 9 PM and wakes up by 3 AM-feels rested at times- states "yes and no" for feeling rested. Mind starts racing with thoughts and ideas of prior day and what he has to do. Sometimes can fall back asleeep but difficult and he is restless. Issues for at least a year.   Dos not feel quite as sharp with memory since beginning of this year.  For example Monday of last week wife told him about friends situation and then she asked about it on Wednesday and he had forgotten they had discussed it.  Patient with reassuring brain MRI in 2016 which was also done due to a right ptosis- some reports of forgetfulness at that time and saw neurology as well.  Forgetfulness at that time was attributed to most likely stress as well.  Patient  has seen a counselor in the past Rudi Heap has not been following up with his exercises recently though.  States he last saw him in September 2019 and was essentially released unless had ongoing issues  Patient does report alprazolam from last year was very helpful for sleep-Still has 1 tablet of alprazolam from # 30 last year.  Depression screen [facility] 2/9 09/28/2018  Decreased Interest 0  Down, Depressed, Hopeless 2  PHQ - 2 Score 2  Altered sleeping 3  Tired, decreased energy 0  Change in appetite 0  Feeling bad or failure about yourself  1   Trouble concentrating 0  Moving slowly or fidgety/restless 1  Suicidal thoughts 0  PHQ-9 Score 7  Difficult doing work/chores Not difficult at all  A/P: Extended counseling today provided for anxiety/depressed mood/poor sleep/stress.  I think most of these issues are interrelated.  I think the poor sleep/stress likely contributed to his perceived memory issues.Mini cog today was completely normal with 3 out of 3 delayed recall and normal clock draw.  Could consider MMSE or [place] in the future.  I did refill alprazolam to help with thought racing before bed with intention of very sparing use given #30 last time over a year last year.  Encouraged him to start back on the exercises given by his counselor- Arsenio Katz.  Patient not very interested in SSRI at present-discussed possible Lexapro 5 mg.  Recommended 3 to 43-month physical or sooner follow-up visit if needed- happy to have blood work completed if Dr. Tamala Julian recommends- as well.  # Left Leg Pain/left ankle pain S:Typically exercised at least 4 days a week previously - has missed the elliptical. Has tried to do some walking.   During this 30 days - Left leg from thigh down to about mid shin aches after any exercise. Also tender over medial epicondyle on the left side. Sitting seems to help but then going to standing worsens symptoms. Mild pain in right thigh .   11-12 years ago hurt his knee after accident- Dr. Elmyra Ricks asked him not to run as much at that time.    He has noticed tingling in the left leg that comes and goes. Tingling is worse when walking or exercising- in the same areas as pain.  He feels stiff when first getting up after sitting for a long period of time. He has also noticed trouble with his balance while trying to put his pants on. Also some imbalance if goes to turn quickly. Notes some mild swelling on the medial aspect of the L knee. Denies increased warmth or erythema.  Denies low back pain, hip pain, groin pain.   From  x-ray 09/17/2007 of lumbar spine "2.  Moderate lower lumbar facet arthropathy.  Evidence of disc degeneration at L2-L3 but relatively preserved disc space heights." A/P: 63 year old male with left quadricep pain radiating to mid shin and mild right quadricep pain-both with palpation.  Outside of palpation- no clear triggers for worsening pain today.  I was honest with patient I did not have a clear guiding light on source of pain-possibly related to degenerative disc disease and radiculopathy.  Could also be neuropathy-consider B12 and TSH with future labs.  I offered referral to sports medicine with Dr. Tamala Julian- he likes this idea as he also wants to follow-up about prior left ankle pain that he has been unable to follow-up with Dr. Paulla Fore about.  Referral placed  Recommended follow up: 3 to 27-month physical or sooner if needed for  acute concerns  Lab/Order associations:   ICD-10-CM   1. Left leg pain  M79.605 Ambulatory referral to Sports Medicine  2. Anxiety  F41.9 ALPRAZolam (XANAX) 0.25 MG tablet  3. Chronic pain of left ankle  M25.572 Ambulatory referral to Sports Medicine   G89.29    Meds ordered this encounter  Medications  . ALPRAZolam (XANAX) 0.25 MG tablet    Sig: Take 1 tablet (0.25 mg total) by mouth 2 (two) times daily as needed for anxiety or sleep (don't drive for 6 hours after taking).    Dispense:  30 tablet    Refill:  0    Time Stamp The duration of face-to-face time during this visit was greater than 25 minutes. Greater than 50% of this time was spent in counseling, explanation of diagnosis, planning of further management, and/or coordination of care including recent stressors, chronic stressors particularly related to work, treatment options, medication side effects and subcu profiles.    Return precautions advised.  Garret Reddish, MD

## 2018-09-28 ENCOUNTER — Encounter: Payer: Self-pay | Admitting: Family Medicine

## 2018-09-28 ENCOUNTER — Encounter

## 2018-09-28 ENCOUNTER — Other Ambulatory Visit: Payer: Self-pay

## 2018-09-28 ENCOUNTER — Ambulatory Visit: Payer: No Typology Code available for payment source | Admitting: Family Medicine

## 2018-09-28 VITALS — BP 110/80 | HR 65 | Temp 98.2°F | Ht 67.0 in | Wt 147.6 lb

## 2018-09-28 DIAGNOSIS — F419 Anxiety disorder, unspecified: Secondary | ICD-10-CM

## 2018-09-28 DIAGNOSIS — G8929 Other chronic pain: Secondary | ICD-10-CM | POA: Diagnosis not present

## 2018-09-28 DIAGNOSIS — M79605 Pain in left leg: Secondary | ICD-10-CM | POA: Diagnosis not present

## 2018-09-28 DIAGNOSIS — M25572 Pain in left ankle and joints of left foot: Secondary | ICD-10-CM

## 2018-09-28 MED ORDER — ALPRAZOLAM 0.25 MG PO TABS: 0.2500 mg | ORAL_TABLET | Freq: Two times a day (BID) | ORAL | 0 refills | Status: DC | PRN

## 2018-10-11 ENCOUNTER — Ambulatory Visit: Payer: No Typology Code available for payment source | Admitting: Family Medicine

## 2018-10-15 ENCOUNTER — Ambulatory Visit: Payer: No Typology Code available for payment source | Admitting: Family Medicine

## 2018-10-15 ENCOUNTER — Ambulatory Visit: Payer: Self-pay

## 2018-10-15 ENCOUNTER — Encounter: Payer: Self-pay | Admitting: Family Medicine

## 2018-10-15 ENCOUNTER — Other Ambulatory Visit (INDEPENDENT_AMBULATORY_CARE_PROVIDER_SITE_OTHER): Payer: No Typology Code available for payment source

## 2018-10-15 ENCOUNTER — Other Ambulatory Visit: Payer: Self-pay

## 2018-10-15 ENCOUNTER — Ambulatory Visit (INDEPENDENT_AMBULATORY_CARE_PROVIDER_SITE_OTHER)
Admission: RE | Admit: 2018-10-15 | Discharge: 2018-10-15 | Disposition: A | Discharge status: Home / Self Care | Payer: No Typology Code available for payment source | Source: Ambulatory Visit | Attending: Family Medicine | Admitting: Family Medicine

## 2018-10-15 VITALS — BP 110/64 | HR 62 | Ht 67.0 in | Wt 147.0 lb

## 2018-10-15 DIAGNOSIS — M25572 Pain in left ankle and joints of left foot: Secondary | ICD-10-CM

## 2018-10-15 DIAGNOSIS — G8929 Other chronic pain: Secondary | ICD-10-CM

## 2018-10-15 DIAGNOSIS — S8262XG Displaced fracture of lateral malleolus of left fibula, subsequent encounter for closed fracture with delayed healing: Secondary | ICD-10-CM

## 2018-10-15 DIAGNOSIS — M79652 Pain in left thigh: Secondary | ICD-10-CM

## 2018-10-15 DIAGNOSIS — M2550 Pain in unspecified joint: Secondary | ICD-10-CM | POA: Diagnosis not present

## 2018-10-15 DIAGNOSIS — M79605 Pain in left leg: Secondary | ICD-10-CM | POA: Insufficient documentation

## 2018-10-15 LAB — SEDIMENTATION RATE: Sed Rate: 7 mm/hr (ref 0–20)

## 2018-10-15 LAB — CBC WITH DIFFERENTIAL/PLATELET
Basophils Absolute: 0 10*3/uL (ref 0.0–0.1)
Basophils Relative: 1 % (ref 0.0–3.0)
Eosinophils Absolute: 0.1 10*3/uL (ref 0.0–0.7)
Eosinophils Relative: 2.6 % (ref 0.0–5.0)
HCT: 43.7 % (ref 39.0–52.0)
Hemoglobin: 14.4 g/dL (ref 13.0–17.0)
Lymphocytes Relative: 40.3 % (ref 12.0–46.0)
Lymphs Abs: 1.9 10*3/uL (ref 0.7–4.0)
MCHC: 33 g/dL (ref 30.0–36.0)
MCV: 91.2 fl (ref 78.0–100.0)
Monocytes Absolute: 0.5 10*3/uL (ref 0.1–1.0)
Monocytes Relative: 9.6 % (ref 3.0–12.0)
Neutro Abs: 2.2 10*3/uL (ref 1.4–7.7)
Neutrophils Relative %: 46.5 % (ref 43.0–77.0)
Platelets: 205 10*3/uL (ref 150.0–400.0)
RBC: 4.79 Mil/uL (ref 4.22–5.81)
RDW: 12.9 % (ref 11.5–15.5)
WBC: 4.7 10*3/uL (ref 4.0–10.5)

## 2018-10-15 LAB — COMPREHENSIVE METABOLIC PANEL
ALT: 11 U/L (ref 0–53)
AST: 17 U/L (ref 0–37)
Albumin: 4.3 g/dL (ref 3.5–5.2)
Alkaline Phosphatase: 42 U/L (ref 39–117)
BUN: 15 mg/dL (ref 6–23)
CO2: 29 mEq/L (ref 19–32)
Calcium: 9.4 mg/dL (ref 8.4–10.5)
Chloride: 103 mEq/L (ref 96–112)
Creatinine, Ser: 1.01 mg/dL (ref 0.40–1.50)
GFR: 75.07 mL/min (ref >60.00)
Glucose, Bld: 93 mg/dL (ref 70–99)
Potassium: 3.8 mEq/L (ref 3.5–5.1)
Sodium: 139 mEq/L (ref 135–145)
Total Bilirubin: 0.8 mg/dL (ref 0.2–1.2)
Total Protein: 7.1 g/dL (ref 6.0–8.3)

## 2018-10-15 LAB — FERRITIN: Ferritin: 80.5 ng/mL (ref 22.0–322.0)

## 2018-10-15 LAB — TSH: TSH: 2.1 u[IU]/mL (ref 0.35–4.50)

## 2018-10-15 LAB — C-REACTIVE PROTEIN: CRP: <1 mg/dL (ref 0.5–20.0)

## 2018-10-15 LAB — IBC PANEL
Iron: 94 ug/dL (ref 42–165)
Saturation Ratios: 29.7 % (ref 20.0–50.0)
Transferrin: 226 mg/dL (ref 212.0–360.0)

## 2018-10-15 LAB — URIC ACID: Uric Acid, Serum: 5.7 mg/dL (ref 4.0–7.8)

## 2018-10-15 LAB — VITAMIN D 25 HYDROXY (VIT D DEFICIENCY, FRACTURES): VITD: 17.19 ng/mL — ABNORMAL LOW (ref 30.00–100.00)

## 2018-10-15 LAB — TESTOSTERONE: Testosterone: 297.59 ng/dL — ABNORMAL LOW (ref 300.00–890.00)

## 2018-10-15 MED ORDER — VITAMIN D (ERGOCALCIFEROL) 1.25 MG (50000 UNIT) PO CAPS: 50000.0000 [IU] | ORAL_CAPSULE | ORAL | 0 refills | Status: DC

## 2018-10-15 NOTE — Assessment & Plan Note (Signed)
An avulsion fracture that is likely old.  Discussed with patient about Aircast, avoiding certain high-impact exercises, ankle x-ray ordered secondary to the narrowing of the intolerant patient does have some weakness of the left lower extremity  Imaging ordered HEP Bracing Ice Vit D  RCT in 4 weeks

## 2018-10-15 NOTE — Progress Notes (Signed)
[facility] [address] [address] [place], [place] [address] Phone: [phone] Subjective:   Jay Combs, am serving as a scribe for Dr. Hulan Saas. I'm seeing this patient by the request  of:  [name], MD   CC: Ankle pain, leg pain  [id number]  Jay Combs is a 63 y.o. male coming in with complaint of left ankle pain. Last seen by Dr. Paulla Fore on 12/14/2017. Injection provided at this visit. Patient states that he did twist his ankle again. Spoke with Dr. Paulla Fore following the 2nd sprain. Pain over lateral malleolous Pain increases with movement but it does not both him with his workouts.   Liked to go to gym and do 4 miles on elliptical. Has been doing HIIT workouts at home now.   Additionally, patient noticed on month ago that his thighs and knees have started to hurt. Was told that he has bursitis in the left knee. Feels that he is lacking strength in his thighs from sit to stand. Has pain with crossing legs in his thighs. Patient had a bite in left arm that correlated with his thigh pain which has improved but is still present.       Past Medical History:  Diagnosis Date  . ALLERGIC RHINITIS 09/25/2006  . BRADYCARDIA 09/25/2006   athletic Heart rate  . CHEST PAIN UNSPECIFIED 10/28/2007   cardiology evaluation including stress test reassuring  . INSOMNIA, CHRONIC 08/09/2008  . PALPITATIONS, CHRONIC 10/28/2007   Past Surgical History:  Procedure Laterality Date  . NASAL SEPTUM SURGERY     Social History   Socioeconomic History  . Marital status: Married    Spouse name: Not on file  . Number of children: 2  . Years of education: Not on file  . Highest education level: Not on file  Occupational History  . Occupation: Games developer: [employer]  Social Needs  . Financial resource strain: Not on file  . Food insecurity    Worry: Not on file    Inability: Not on file  . Transportation needs    Medical: Not on file   Non-medical: Not on file  Tobacco Use  . Smoking status: Never Smoker  . Smokeless tobacco: Never Used  Substance and Sexual Activity  . Alcohol use: Yes    Alcohol/week: 5.0 standard drinks    Types: 5 Glasses of wine per week    Comment: 3-4 glasses per week, then some wine on weekend  . Drug use: Combs  . Sexual activity: Not on file  Lifestyle  . Physical activity    Days per week: Not on file    Minutes per session: Not on file  . Stress: Not on file  Relationships  . Social Herbalist on phone: Not on file    Gets together: Not on file    Attends religious service: Not on file    Active member of club or organization: Not on file    Attends meetings of clubs or organizations: Not on file    Relationship status: Not on file  Other Topics Concern  . Not on file  Social History Narrative   Married. 2 sons 38 and 62 in 2018. Combs grandkids yet. Has some grandcats.         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 Demerol [Meperidine] Other (See Comments)    Decreased BP  . Meperidine Hcl     REACTION: decreased BP   Family History  Problem Relation Age of Onset  . Aortic aneurysm Mother        never smoker  . Breast cancer Mother        stage I- mastectomy  . Cervical cancer Mother        ? uterine.   . Lung cancer Father        smoker. chemo, radiation. surgery not available- spots shrunk some. depressed mood  . Scoliosis Sister        also some head trauma after abuse from spouse  . Other Son        X-linked gammaglobulinemia. monthly infusions  . Healthy Son   . Other Maternal Grandmother        unilateral kidney but lived to 10  . Heart attack Paternal Grandfather        younger than 41- but patients dad has not had issues         Current Outpatient  Medications (Other):  Marland Kitchen  ALPRAZolam (XANAX) 0.25 MG tablet, Take 1 tablet (0.25 mg total) by mouth 2 (two) times daily as needed for anxiety or sleep (don't drive for 6 hours after taking).    Past medical history, social, surgical and family history all reviewed in electronic medical record.  Combs pertanent information unless stated regarding to the chief complaint.   Review of Systems:  Combs headache, visual changes, nausea, vomiting, diarrhea, constipation, dizziness, abdominal pain, skin rash, fevers, chills, night sweats, weight loss, swollen lymph nodes, body aches, joint swelling, muscle aches, chest pain, shortness of breath, mood changes.   Objective  There were Combs vitals taken for this visit. Systems examined below as of    General: Combs apparent distress alert and oriented x3 mood and affect normal, dressed appropriately.  HEENT: Pupils equal, extraocular movements intact  Respiratory: Patient's speak in full sentences and does not appear short of breath  Cardiovascular: Combs lower extremity edema, non tender, Combs erythema  Skin: Warm dry intact with Combs signs of infection or rash on extremities or on axial skeleton.  Abdomen: Soft nontender  Neuro: Cranial nerves II through XII are intact, neurovascularly intact in all extremities with 2+ DTRs and 2+ pulses.  Lymph: Combs lymphadenopathy of posterior or anterior cervical chain or axillae bilaterally.  Gait antalgic MSK:  Non tender with full range of motion and good stability and symmetric strength and tone of shoulders, elbows, wrist, hip, knee bilaterally.  Left leg shows some mild atrophy of the quadricep compared to the contralateral side.  Patient does have tightness of the hamstrings causing more injury and pain to the peds anserine area on the left leg.  Left ankle alone does show some mild abnormality over the lateral malleolus and tender in this area.  Near full range of motion with near full strength.  Deep tendon reflexes intact.   Limited musculoskeletal ultrasound was performed and interpreted by Lyndal Pulley  Limited ultrasound shows the patient does have narrowing of the talar dome of the ankle mortise, patient does have what appears to be a nonhealing avulsion fracture of the lateral malleolus.  Soft callus formation noted.  Cortical irregularity also extends 1 cm proximal. Impression: Delayed healing of an avulsion fracture of the lateral malleolus  97110; 15  additional minutes spent for Therapeutic exercises as stated in above notes.  This included exercises focusing on stretching, strengthening, with significant focus on eccentric aspects.   Long term goals include an improvement in range of motion, strength, endurance as well as avoiding reinjury. Patient's frequency would include in 1-2 times a day, 3-5 times a week for a duration of 6-12 weeks. Ankle strengthening that included:  Basic range of motion exercises to allow proper full motion at ankle Stretching of the lower leg and hamstrings  Theraband exercises for the lower leg - inversion, eversion, dorsiflexion and plantarflexion each to be completed with a theraband Balance exercises to increase proprioception Weight bearing exercises to increase strength and balance   Proper technique shown and discussed handout in great detail with ATC.  All questions were discussed and answered.     Impression and Recommendations:     This case required medical decision making of moderate complexity. The above documentation has been reviewed and is accurate and complete Jacqualin Combes       Note: This dictation was prepared with Dragon dictation along with smaller phrase technology. Any transcriptional errors that result from this process are unintentional.

## 2018-10-15 NOTE — Patient Instructions (Addendum)
Good to see you.  Ice 20 minutes 2 times daily. Usually after activity and before bed. Exercises 3 times a week.  Aircast daily for 2 weeks and with exercise for 6 weeks No high impact exercise Exercise bike  Once weekly vitamin D for 12 weeks.  K2 200mg  daily  See me again in 3-4 weeks

## 2018-10-15 NOTE — Assessment & Plan Note (Signed)
Very mild weakness and muscle atrophy compared to contralateral side. May have hada bite previously, no sign of infection.  XRay and labs ordered today  We will see if this gives [place] any direction.

## 2018-10-16 ENCOUNTER — Encounter: Payer: Self-pay | Admitting: Family Medicine

## 2018-10-19 LAB — CALCIUM, IONIZED: Calcium, Ion: 5.2 mg/dL (ref 4.8–5.6)

## 2018-10-19 LAB — RHEUMATOID FACTOR: Rheumatoid fact SerPl-aCnc: <14 IU/mL (ref <14)

## 2018-10-19 LAB — CYCLIC CITRUL PEPTIDE ANTIBODY, IGG: Cyclic Citrullin Peptide Ab: <16 UNITS

## 2018-10-19 LAB — ANGIOTENSIN CONVERTING ENZYME: Angiotensin-Converting Enzyme: 39 U/L (ref 9–67)

## 2018-10-19 LAB — PTH, INTACT AND CALCIUM
Calcium: 9.4 mg/dL (ref 8.6–10.3)
PTH: 22 pg/mL (ref 14–64)

## 2018-10-19 LAB — ANA: Anti Nuclear Antibody (ANA): NEGATIVE
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
## 2018-11-16 ENCOUNTER — Ambulatory Visit: Payer: No Typology Code available for payment source | Admitting: Family Medicine

## 2018-11-16 ENCOUNTER — Encounter: Payer: Self-pay | Admitting: Family Medicine

## 2018-11-16 ENCOUNTER — Other Ambulatory Visit: Payer: Self-pay

## 2018-11-16 ENCOUNTER — Ambulatory Visit: Payer: Self-pay

## 2018-11-16 ENCOUNTER — Ambulatory Visit (INDEPENDENT_AMBULATORY_CARE_PROVIDER_SITE_OTHER)
Admission: RE | Admit: 2018-11-16 | Discharge: 2018-11-16 | Disposition: A | Discharge status: Home / Self Care | Payer: No Typology Code available for payment source | Source: Ambulatory Visit | Attending: Family Medicine | Admitting: Family Medicine

## 2018-11-16 VITALS — BP 102/74 | HR 68 | Wt 152.0 lb

## 2018-11-16 DIAGNOSIS — G8929 Other chronic pain: Secondary | ICD-10-CM | POA: Diagnosis not present

## 2018-11-16 DIAGNOSIS — M79605 Pain in left leg: Secondary | ICD-10-CM | POA: Diagnosis not present

## 2018-11-16 DIAGNOSIS — M545 Low back pain, unspecified: Secondary | ICD-10-CM

## 2018-11-16 DIAGNOSIS — S8262XG Displaced fracture of lateral malleolus of left fibula, subsequent encounter for closed fracture with delayed healing: Secondary | ICD-10-CM

## 2018-11-16 DIAGNOSIS — M25572 Pain in left ankle and joints of left foot: Secondary | ICD-10-CM

## 2018-11-16 MED ORDER — GABAPENTIN 100 MG PO CAPS: 200.0000 mg | ORAL_CAPSULE | Freq: Every day | ORAL | 0 refills | Status: DC

## 2018-11-16 NOTE — Patient Instructions (Signed)
Xray downstairs Gabapentin 200mg  at night Exercises 3x a week Aircast with walking and exercise for 4 weeks See me in 4 weeks but if not better in 2 weeks write me so we can consider MRI

## 2018-11-16 NOTE — Assessment & Plan Note (Signed)
Left leg pain with the femur x-rays being unremarkable.  Patient does have some atrophy of the musculature and concern for more of a nerve root impingement from the back that is likely contributing.  X-rays of the back ordered today, start on low-dose of gabapentin, range of motion exercises, if no improvement or worsening MRI will be necessary.  Patient is in agreement with the plan.  Follow-up again 4 to 6 weeks

## 2018-11-16 NOTE — Assessment & Plan Note (Signed)
Continued mild cortical defect noted on ultrasound.  Continue the brace when working out.  Can stop doing the activities as repetitively.  Discussed which activities to do which was to avoid.  Increase activity as tolerated.  Follow-up again 4 weeks to further evaluate and hopefully fully released.

## 2018-12-03 ENCOUNTER — Encounter: Payer: Self-pay | Admitting: Family Medicine
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
## 2018-12-15 ENCOUNTER — Other Ambulatory Visit: Payer: Self-pay

## 2018-12-15 ENCOUNTER — Encounter: Payer: Self-pay | Admitting: Family Medicine

## 2018-12-15 ENCOUNTER — Ambulatory Visit: Payer: No Typology Code available for payment source | Admitting: Family Medicine

## 2018-12-15 VITALS — BP 102/72 | HR 88 | Ht 67.0 in | Wt 150.0 lb

## 2018-12-15 DIAGNOSIS — M5416 Radiculopathy, lumbar region: Secondary | ICD-10-CM

## 2018-12-15 DIAGNOSIS — S8262XG Displaced fracture of lateral malleolus of left fibula, subsequent encounter for closed fracture with delayed healing: Secondary | ICD-10-CM | POA: Diagnosis not present

## 2018-12-15 MED ORDER — VITAMIN D (ERGOCALCIFEROL) 1.25 MG (50000 UNIT) PO CAPS: 50000.0000 [IU] | ORAL_CAPSULE | ORAL | 0 refills | Status: DC

## 2018-12-15 NOTE — Assessment & Plan Note (Signed)
Lumbar radiculopathy discussed consistent with scaphoid.  Concern for any significant improvement with conservative therapy.  X-rays do show a degenerative change at L5-S1 that would be consistent with the radicular symptoms as well as the osteophyte formation at L4-L5 on the left side as well.  We will get MRI to further evaluate and patient could be a candidate for epidurals or nerve root injections.  Patient would want avoid any type of surgical intervention.  We will hold on doing any more formal physical therapy secondary to the coronavirus.  Follow-up with me again after imaging we will discuss further

## 2018-12-15 NOTE — Assessment & Plan Note (Signed)
Overall looking really well.  Patient is able to increase activity as tolerated, encourage patient to range of motion.  Icing regimen.  Follow-up again as needed for this problem

## 2018-12-15 NOTE — Patient Instructions (Signed)
[facility] [phone] Increase activity as tolerated Vitamin D at your pharmacy Will call you when I get MRI results

## 2018-12-17 ENCOUNTER — Encounter: Payer: Self-pay | Admitting: Family Medicine

## 2019-01-08 ENCOUNTER — Inpatient Hospital Stay: Admission: RE | Admit: 2019-01-08 | Payer: No Typology Code available for payment source | Source: Ambulatory Visit

## 2019-01-10 ENCOUNTER — Other Ambulatory Visit: Payer: Self-pay | Admitting: Family Medicine

## 2019-01-10 DIAGNOSIS — M545 Low back pain, unspecified: Secondary | ICD-10-CM

## 2019-01-12 ENCOUNTER — Ambulatory Visit
Admission: RE | Admit: 2019-01-12 | Discharge: 2019-01-12 | Disposition: A | Discharge status: Home / Self Care | Payer: No Typology Code available for payment source | Source: Ambulatory Visit | Attending: Family Medicine | Admitting: Family Medicine

## 2019-01-12 DIAGNOSIS — M545 Low back pain, unspecified: Secondary | ICD-10-CM

## 2019-01-13 ENCOUNTER — Other Ambulatory Visit: Payer: Self-pay | Admitting: Family Medicine

## 2019-01-13 ENCOUNTER — Encounter: Payer: Self-pay | Admitting: Family Medicine

## 2019-01-14 MED ORDER — VITAMIN D (ERGOCALCIFEROL) 1.25 MG (50000 UNIT) PO CAPS: 50000.0000 [IU] | ORAL_CAPSULE | ORAL | 0 refills | Status: DC

## 2019-01-17 ENCOUNTER — Ambulatory Visit (INDEPENDENT_AMBULATORY_CARE_PROVIDER_SITE_OTHER): Payer: No Typology Code available for payment source | Admitting: Family Medicine

## 2019-01-17 ENCOUNTER — Encounter: Payer: Self-pay | Admitting: Family Medicine

## 2019-01-17 DIAGNOSIS — S8262XG Displaced fracture of lateral malleolus of left fibula, subsequent encounter for closed fracture with delayed healing: Secondary | ICD-10-CM | POA: Diagnosis not present

## 2019-01-17 DIAGNOSIS — M5416 Radiculopathy, lumbar region: Secondary | ICD-10-CM

## 2019-01-17 NOTE — Progress Notes (Signed)
Virtual Visit via Video Note  I connected with Jay Combs on 01/17/19 at 12:45 PM EST by a video enabled telemedicine application and verified that I am speaking with the correct person using two identifiers.  Location: Patient: at home alone  Provider: in home office setting alone    I discussed the limitations of evaluation and management by telemedicine and the availability of in person appointments. The patient expressed understanding and agreed to proceed.  History of Present Illness: 63 year old gentleman who has had chronic back pain.  Patient was having more of a radicular symptoms in the L5 distribution on the left leg.  Patient was sent for an MRI and did show foraminal stenosis likely contributing to an L5 nerve root impingement.  Patient continues to have the leg pain intermittently.  May be potentially getting worse.  Denies any weakness though.  Denies any chronic numbness.   Observations/Objective: Alert and oriented x3, sitting comfortably in his office chair   Assessment and Plan: 63 year old gentleman with an L5 nerve root impingement left side that seems to correspond with MRI and patient's findings previously on exam.  At this point I would recommend a nerve root injection.  Patient will have this done in the near future.  Orders placed today.  Discussed icing regimen and home exercises, discussed following up with [place] 3 weeks after the injection.  At that time we will check patient's back and ankle.   Follow Up Instructions: 3 weeks after epidural or nerve root injection on the left L5 to evaluate this as well as his ankle.    I discussed the assessment and treatment plan with the patient. The patient was provided an opportunity to ask questions and all were answered. The patient agreed with the plan and demonstrated an understanding of the instructions.   The patient was advised to call back or seek an in-person evaluation if the symptoms worsen or if the condition  fails to improve as anticipated.   I provided 25 minutes of face-to-face time during this encounter.   Lyndal Pulley, DO

## 2019-01-17 NOTE — Telephone Encounter (Signed)
Left message for patient to call back to schedule virtual appointment with Dr. Tamala Julian.

## 2019-01-17 NOTE — Assessment & Plan Note (Signed)
Continues have mild pain and we will follow up on follow-up and doing ultrasound again

## 2019-01-18 ENCOUNTER — Other Ambulatory Visit: Payer: Self-pay

## 2019-01-18 DIAGNOSIS — M5416 Radiculopathy, lumbar region: Secondary | ICD-10-CM

## 2019-01-20 ENCOUNTER — Telehealth: Payer: Self-pay

## 2019-01-20 ENCOUNTER — Encounter: Payer: Self-pay | Admitting: Family Medicine

## 2019-01-20 ENCOUNTER — Ambulatory Visit
Admission: RE | Admit: 2019-01-20 | Discharge: 2019-01-20 | Disposition: A | Discharge status: Home / Self Care | Payer: No Typology Code available for payment source | Source: Ambulatory Visit | Attending: Family Medicine | Admitting: Family Medicine

## 2019-01-20 ENCOUNTER — Other Ambulatory Visit: Payer: Self-pay

## 2019-01-20 DIAGNOSIS — M5416 Radiculopathy, lumbar region: Secondary | ICD-10-CM

## 2019-01-20 MED ORDER — IOPAMIDOL (ISOVUE-M 200) INJECTION 41%
1.0000 mL | Freq: Once | INTRAMUSCULAR | Status: AC
  Administered 2019-01-20: 09:00:00 1 mL via EPIDURAL

## 2019-01-20 MED ORDER — METHYLPREDNISOLONE ACETATE 40 MG/ML INJ SUSP (RADIOLOG
120.0000 mg | Freq: Once | INTRAMUSCULAR | Status: AC
  Administered 2019-01-20: 09:00:00 120 mg via EPIDURAL

## 2019-01-20 NOTE — Telephone Encounter (Signed)
Scheduled patient epidural fu appointment.

## 2019-01-20 NOTE — Discharge Instructions (Signed)

## 2019-01-25 ENCOUNTER — Encounter: Payer: Self-pay | Admitting: Family Medicine

## 2019-01-27 ENCOUNTER — Encounter: Payer: No Typology Code available for payment source | Admitting: Family Medicine

## 2019-02-18 ENCOUNTER — Ambulatory Visit: Payer: No Typology Code available for payment source | Admitting: Family Medicine

## 2019-03-03 ENCOUNTER — Encounter: Payer: Self-pay | Admitting: Family Medicine

## 2019-03-07 ENCOUNTER — Ambulatory Visit: Payer: No Typology Code available for payment source | Admitting: Family Medicine

## 2019-03-10 ENCOUNTER — Ambulatory Visit: Payer: No Typology Code available for payment source | Attending: Internal Medicine

## 2019-03-10 DIAGNOSIS — Z20822 Contact with and (suspected) exposure to covid-19: Secondary | ICD-10-CM

## 2019-03-11 ENCOUNTER — Encounter: Payer: Self-pay | Admitting: Family Medicine

## 2019-03-11 LAB — NOVEL CORONAVIRUS, NAA: SARS-CoV-2, NAA: NOT DETECTED

## 2019-03-21 ENCOUNTER — Other Ambulatory Visit: Payer: Self-pay

## 2019-04-01 ENCOUNTER — Ambulatory Visit (INDEPENDENT_AMBULATORY_CARE_PROVIDER_SITE_OTHER): Payer: No Typology Code available for payment source | Admitting: Family Medicine

## 2019-04-01 ENCOUNTER — Encounter: Payer: Self-pay | Admitting: Family Medicine

## 2019-04-01 ENCOUNTER — Other Ambulatory Visit: Payer: Self-pay

## 2019-04-01 VITALS — BP 110/68 | HR 71 | Temp 98.6°F | Ht 66.0 in | Wt 154.0 lb

## 2019-04-01 DIAGNOSIS — Z0000 Encounter for general adult medical examination without abnormal findings: Secondary | ICD-10-CM | POA: Diagnosis not present

## 2019-04-01 DIAGNOSIS — E785 Hyperlipidemia, unspecified: Secondary | ICD-10-CM

## 2019-04-01 DIAGNOSIS — K219 Gastro-esophageal reflux disease without esophagitis: Secondary | ICD-10-CM

## 2019-04-01 DIAGNOSIS — Z1212 Encounter for screening for malignant neoplasm of rectum: Secondary | ICD-10-CM

## 2019-04-01 DIAGNOSIS — G4700 Insomnia, unspecified: Secondary | ICD-10-CM

## 2019-04-01 DIAGNOSIS — Z125 Encounter for screening for malignant neoplasm of prostate: Secondary | ICD-10-CM

## 2019-04-01 DIAGNOSIS — Z1211 Encounter for screening for malignant neoplasm of colon: Secondary | ICD-10-CM

## 2019-04-01 NOTE — Patient Instructions (Addendum)
Health Maintenance Due  Topic Date Due  . COLONOSCOPY order placed . We will call you within two weeks about your referral to [facility] for colonoscopy. If you do not hear within 3 weeks, give [place] a call.   Ok to ask them if you can do this in summertime  11/10/2018   Schedule a lab visit at the check out desk within 2 weeks. Return for future fasting labs meaning nothing but water after midnight please. Ok to take your medications with water.   Recommended follow up: Return in about 1 year (around 03/31/2020) for physical or sooner if needed.

## 2019-04-01 NOTE — Progress Notes (Signed)
Phone: [phone]   Subjective:  Patient presents today for their annual physical. Chief complaint-noted.   See problem oriented charting- Review of Systems  Constitutional: Negative for chills and fever.  HENT: Negative for ear pain, hearing loss and tinnitus.   Eyes: Negative for blurred vision, double vision and photophobia.  Respiratory: Negative for cough, shortness of breath and wheezing.   Cardiovascular: Negative for chest pain, palpitations and leg swelling.  Gastrointestinal: Negative for blood in stool, constipation, heartburn, nausea and vomiting.  Genitourinary: Negative for dysuria, frequency and urgency.  Musculoskeletal: Positive for back pain. Negative for joint pain and neck pain.       Followed by Dr. Tamala Julian   Skin: Negative for itching and rash.  Neurological: Negative for dizziness, seizures and headaches.  Endo/Heme/Allergies: Does not bruise/bleed easily.  Psychiatric/Behavioral: Negative for depression, memory loss and suicidal ideas. The patient does not have insomnia.    The following were reviewed and entered/updated in epic: Past Medical History:  Diagnosis Date  . ALLERGIC RHINITIS 09/25/2006  . BRADYCARDIA 09/25/2006   athletic Heart rate  . CHEST PAIN UNSPECIFIED 10/28/2007   cardiology evaluation including stress test reassuring  . INSOMNIA, CHRONIC 08/09/2008  . PALPITATIONS, CHRONIC 10/28/2007   Patient Active Problem List   Diagnosis Date Noted  . Lumbar radiculopathy 12/15/2018    Priority: Medium  . Anxiety 08/12/2017    Priority: Medium  . GERD (gastroesophageal reflux disease) 01/12/2017    Priority: Medium  . Hyperlipidemia 01/12/2017    Priority: Medium  . Ptosis, right 01/12/2017    Priority: Medium  . INSOMNIA, CHRONIC 08/09/2008    Priority: Medium  . PALPITATIONS, CHRONIC 10/28/2007    Priority: Medium  . Left leg pain 10/15/2018    Priority: Low  . Macular pucker of both eyes 01/12/2017    Priority: Low  . Allergic  rhinitis 09/25/2006    Priority: Low  . Closed disp fracture of left lateral malleolus with delayed healing 10/15/2018   Past Surgical History:  Procedure Laterality Date  . NASAL SEPTUM SURGERY      Family History  Problem Relation Age of Onset  . Aortic aneurysm Mother        never smoker  . Breast cancer Mother        stage I- mastectomy  . Cervical cancer Mother        ? uterine.   . Lung cancer Father        smoker. chemo, radiation. surgery not available- spots shrunk some. depressed mood  . Scoliosis Sister        also some head trauma after abuse from spouse  . Other Son        X-linked gammaglobulinemia. monthly infusions  . Healthy Son   . Other Maternal Grandmother        unilateral kidney but lived to 105  . Heart attack Paternal Grandfather        younger than 26- but patients dad has not had issues    Medications- reviewed and updated No current outpatient medications on file.   No current facility-administered medications for this visit.    Allergies-reviewed and updated Allergies  Allergen Reactions  . Demerol [Meperidine] Other (See Comments)    Decreased BP  . Meperidine Hcl     REACTION: decreased BP    Social History   Social History Narrative   Married. 2 sons 93 and 26 in 2018. No grandkids yet. Has some grandcats.  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 TM normal. Bridge of nose normal. Eyelids normal.  Neck: no thyromegaly or cervical lymphadenopathy  CV: RRR no murmurs rubs or gallops Lungs: CTAB no crackles, wheeze, rhonchi Abdomen:  soft/nontender/nondistended/normal bowel sounds. No rebound or guarding.  Ext: no edema Skin: warm, dry Neuro: grossly normal, moves all extremities, PERRLA    Assessment and Plan  64 y.o. male presenting for annual physical.  Health Maintenance counseling: 1. Anticipatory guidance: Patient counseled regarding regular dental exams q6 months or at least yearly, eye exams yearly,  avoiding smoking and second hand smoke , limiting alcohol to 2 beverages per day . Only drinks on weekends will have a bottle of wine over two nights    2. Risk factor reduction:  Advised patient of need for regular exercise and diet rich and fruits and vegetables to reduce risk of heart attack and stroke. Exercise- Going to [facility] 5 days a week. Diet-No fast food or sodas. Tries to eat reasonably healthy- avoids processed foods Wt Readings from Last 3 Encounters:  04/01/19 154 lb (69.9 kg)  12/15/18 150 lb (68 kg)  11/16/18 152 lb (68.9 kg)  3. Immunizations/screenings/ancillary studies-fully up-to-date other than COVID-19 vaccination-he is interested when his face becomes available  Immunization History  Administered Date(s) Administered  . Influenza Inj Mdck Quad Pf 11/14/2018  . Influenza Split 12/09/2010  . Influenza Whole 02/10/2005, 12/08/2006, 10/28/2007, 11/24/2008, 11/19/2009  . Influenza,inj,Quad PF,6+ Mos 12/05/2013, 12/08/2014, 10/16/2015, 12/01/2016  . Influenza-Unspecified 11/14/2018  . Td 02/10/2005  . Tdap 10/16/2015  . Zoster 10/06/2012  . Zoster Recombinat (Shingrix) 06/15/2017, 08/31/2017  4. Prostate cancer screening- low risk for PSA trend-had PSA with blood work at work and  Lab Results  Component Value Date   PSA 1.47 01/12/2017   PSA 1.16 10/08/2015   PSA 1.73 05/30/2015   5. Colon cancer screening - Last done 11/09/2008. Due last year. Order placed today. He is going to wait until #s overall on covid even better and possibly until he gets vaccine  6. Skin cancer screening- followed by  dermatology advised regular sunscreen use. Denies worrisome, changing, or new skin lesions.  7. Never smoker  Status of chronic or acute concerns   # anxiety/depressed mood/insomnia S: patient states has much improved from august visit- at that point had a lot of work related stress. He is not sure what has helped but overall he feels better- feels liek he goes in and out of cycles of not feeling as well   Mind races right before bed. Up to half an hour to fall asleep related to this. He uses melatonin 3 mg each night. If goes 3-4 nights in a bad stretch may use alprazolam for 1 night and somewhat resets him. Still has half of supply of #30 from august.  A/P:  phq9 down to 3 today from 7 last visit. Has seen Arsenio Katz in past but doesn't mention recent visits. Thrilled he is doing better without  meds or counseling.  - I think sparing alprazolam for a tough stretch of sleep is reasonable (he has used less than 15 pills in 6 months)  PALPITATIONS, CHRONIC- much better recently- was worse with stress and anxiety.   Left leg pain- injection was not helpful in lumbar spine related to radiculopathy- encouraged follow up with Dr. Tamala Julian  INSOMNIA, CHRONIC  Gastroesophageal reflux disease without esophagitis  Hyperlipidemia, unspecified hyperlipidemia type- 6.4% risk based on last labs. he also had labs about a year ago for life insurance (LDL 124, hdl 73, chol 215) which would actually make his risk level lower. We will update lipids soon and recalculate 10 year risk   Recommended follow up: Return in about 1 year (around 03/31/2020) for physical or sooner if needed. Future Appointments  Date Time Provider [place]  04/06/2019  8:45 AM [facility]   Lab/Order associations:Not  Fasting- will return for fasting labs   ICD-10-CM   1. Preventative health care  Z00.00 Lipid panel    PSA    CBC with Differential/Platelet    Comprehensive metabolic panel  2. PALPITATIONS,  CHRONIC  R00.2   3. INSOMNIA, CHRONIC  G47.00   4. Gastroesophageal reflux disease without esophagitis  K21.9   5. Hyperlipidemia, unspecified hyperlipidemia type  E78.5 Lipid panel    CBC with Differential/Platelet    Comprehensive metabolic panel  6. Screening for colorectal cancer  Z12.11 Ambulatory referral to Gastroenterology   Z12.12   7. Screening for prostate cancer  Z12.5 PSA   Return precautions advised.  Garret Reddish, MD

## 2019-04-06 ENCOUNTER — Other Ambulatory Visit (INDEPENDENT_AMBULATORY_CARE_PROVIDER_SITE_OTHER): Payer: No Typology Code available for payment source

## 2019-04-06 ENCOUNTER — Other Ambulatory Visit: Payer: Self-pay

## 2019-04-06 DIAGNOSIS — Z0000 Encounter for general adult medical examination without abnormal findings: Secondary | ICD-10-CM | POA: Diagnosis not present

## 2019-04-06 DIAGNOSIS — Z125 Encounter for screening for malignant neoplasm of prostate: Secondary | ICD-10-CM

## 2019-04-06 DIAGNOSIS — E785 Hyperlipidemia, unspecified: Secondary | ICD-10-CM

## 2019-04-06 LAB — CBC WITH DIFFERENTIAL/PLATELET
Basophils Absolute: 0 10*3/uL (ref 0.0–0.1)
Basophils Relative: 0.8 % (ref 0.0–3.0)
Eosinophils Absolute: 0.2 10*3/uL (ref 0.0–0.7)
Eosinophils Relative: 2.8 % (ref 0.0–5.0)
HCT: 43.9 % (ref 39.0–52.0)
Hemoglobin: 14.7 g/dL (ref 13.0–17.0)
Lymphocytes Relative: 40.7 % (ref 12.0–46.0)
Lymphs Abs: 2.2 10*3/uL (ref 0.7–4.0)
MCHC: 33.4 g/dL (ref 30.0–36.0)
MCV: 92.1 fl (ref 78.0–100.0)
Monocytes Absolute: 0.6 10*3/uL (ref 0.1–1.0)
Monocytes Relative: 10.9 % (ref 3.0–12.0)
Neutro Abs: 2.4 10*3/uL (ref 1.4–7.7)
Neutrophils Relative %: 44.8 % (ref 43.0–77.0)
Platelets: 212 10*3/uL (ref 150.0–400.0)
RBC: 4.77 Mil/uL (ref 4.22–5.81)
RDW: 13.3 % (ref 11.5–15.5)
WBC: 5.4 10*3/uL (ref 4.0–10.5)

## 2019-04-06 LAB — COMPREHENSIVE METABOLIC PANEL
ALT: 11 U/L (ref 0–53)
AST: 18 U/L (ref 0–37)
Albumin: 4.3 g/dL (ref 3.5–5.2)
Alkaline Phosphatase: 44 U/L (ref 39–117)
BUN: 19 mg/dL (ref 6–23)
CO2: 27 mEq/L (ref 19–32)
Calcium: 9.6 mg/dL (ref 8.4–10.5)
Chloride: 103 mEq/L (ref 96–112)
Creatinine, Ser: 1.01 mg/dL (ref 0.40–1.50)
GFR: 74.96 mL/min (ref >60.00)
Glucose, Bld: 92 mg/dL (ref 70–99)
Potassium: 4.2 mEq/L (ref 3.5–5.1)
Sodium: 138 mEq/L (ref 135–145)
Total Bilirubin: 1.2 mg/dL (ref 0.2–1.2)
Total Protein: 6.8 g/dL (ref 6.0–8.3)

## 2019-04-06 LAB — LIPID PANEL
Cholesterol: 217 mg/dL — ABNORMAL HIGH (ref 0–200)
HDL: 73.2 mg/dL (ref >39.00)
LDL Cholesterol: 132 mg/dL — ABNORMAL HIGH (ref 0–99)
NonHDL: 143.39
Total CHOL/HDL Ratio: 3
Triglycerides: 58 mg/dL (ref 0.0–149.0)
VLDL: 11.6 mg/dL (ref 0.0–40.0)

## 2019-04-06 LAB — PSA: PSA: 1.98 ng/mL (ref 0.10–4.00)

## 2019-04-13 ENCOUNTER — Encounter: Payer: Self-pay | Admitting: Family Medicine

## 2019-04-13 LAB — PSA: PSA: 1.47

## 2019-04-19 ENCOUNTER — Encounter: Payer: Self-pay | Admitting: Family Medicine

## 2019-04-21 ENCOUNTER — Encounter: Payer: Self-pay | Admitting: Family Medicine

## 2019-04-25 ENCOUNTER — Other Ambulatory Visit: Payer: Self-pay

## 2019-04-25 DIAGNOSIS — M79652 Pain in left thigh: Secondary | ICD-10-CM

## 2019-04-25 NOTE — Progress Notes (Unsigned)
[id number] 

## 2019-04-29 ENCOUNTER — Other Ambulatory Visit: Payer: Self-pay

## 2019-04-29 ENCOUNTER — Ambulatory Visit (HOSPITAL_COMMUNITY)
Admission: RE | Admit: 2019-04-29 | Discharge: 2019-04-29 | Disposition: A | Discharge status: Home / Self Care | Payer: No Typology Code available for payment source | Source: Ambulatory Visit | Attending: Cardiology | Admitting: Cardiology

## 2019-04-29 DIAGNOSIS — M79652 Pain in left thigh: Secondary | ICD-10-CM | POA: Diagnosis not present

## 2019-05-02 ENCOUNTER — Encounter: Payer: Self-pay | Admitting: Family Medicine

## 2019-06-08 ENCOUNTER — Other Ambulatory Visit: Payer: Self-pay

## 2019-06-08 ENCOUNTER — Ambulatory Visit: Payer: No Typology Code available for payment source | Admitting: Physical Medicine and Rehabilitation

## 2019-06-08 VITALS — BP 104/70 | HR 71

## 2019-06-08 DIAGNOSIS — R531 Weakness: Secondary | ICD-10-CM

## 2019-06-08 DIAGNOSIS — M25552 Pain in left hip: Secondary | ICD-10-CM | POA: Diagnosis not present

## 2019-06-08 DIAGNOSIS — R2689 Other abnormalities of gait and mobility: Secondary | ICD-10-CM | POA: Diagnosis not present

## 2019-06-08 DIAGNOSIS — M47816 Spondylosis without myelopathy or radiculopathy, lumbar region: Secondary | ICD-10-CM

## 2019-06-08 NOTE — Progress Notes (Signed)
Numeric Pain Rating Scale and Functional Assessment Average Pain (6) Pain Right Now (0)    In the last MONTH (on 0-10 scale) has pain interfered with the following?  1. General activity like being  able to carry out your everyday physical activities such as walking, climbing stairs, carrying groceries, or moving a chair?  Rating(8)  2. Relation with others like being able to carry out your usual social activities and roles such as  activities at home, at work and in your community. Rating(0)  3. Enjoyment of life such that you have  been bothered by emotional problems such as feeling anxious, depressed or irritable?  Rating(0)

## 2019-06-09 ENCOUNTER — Encounter: Payer: Self-pay | Admitting: Physical Medicine and Rehabilitation

## 2019-06-09 NOTE — Progress Notes (Signed)
Jay Combs - 64 y.o. male MRN [id number]  Date of birth: 07-Jul-1955  Office Visit Note: Visit Date: 06/08/2019 PCP: [name], MD Referred by: [name], MD  Subjective: Chief Complaint  Patient presents with  . Lower Back - Pain  . Left Hip - Pain  . Weakness  . Gait Problem   HPI: Jay Combs is a 64 y.o. male who comes in today As a self-referral for evaluation and management of chronic worsening low back pain with left more than right thigh pain and a feeling of weakness and balance difficulties.  I have seen his wife for evaluation recently.  Reviewing his notes he was initially seen by Dr. Teresa Coombs who used to be in our office but he was seen at the family medicine practice on [place].  Patient's primary care physician is Dr. Garret Reddish at that location.  He was having more left ankle pain and was treated by Dr. Paulla Fore for more musculoskeletal complaints.  This was sometime around August 2020.  After that time he saw Dr. Hulan Saas who performed work-up of back pain and what was felt to be an L5 radicular pain by Dr. Tamala Julian.  According to the patient his complaints during this time has been worsening back pain particularly with prolonged sitting and then standing after that and with prolonged standing or walking.  He also reports pain in the left thigh somewhat anterior thigh not really posterior lateral but the patient has a hard time really saying where in the thigh he feels it.  It is more with external rotation when he tries to cross one leg to the knee on the left.  Does not have the pain with doing that on the right.  Does have some right thigh pain at times and this is worse in the little bit but is not as bad as the left.  He has not had any falls or trauma.  He is not any focal weakness but does feel like his legs are weak at times.  He does report some balance difficulty and occasionally some neck pain.  Dr. Tamala Julian treated him conservatively  with medication management as well as activity modification.  Patient is very active he does use the elliptical for 3-1/2 to 4 miles a day.  He does feel better on that once he gets going.  He is very stiff at times getting out of bed.  MRI ultimately was performed and this is showed in detail to the patient today with spine models and imaging.  A left L5 transforaminal epidural steroid injection was ordered by Dr. Tamala Julian and performed by [facility] imaging.  We reviewed those images today and it shows like it is a well-placed epidural injection from a transforaminal approach.  The thought by Dr. Tamala Julian was that this was foraminal narrowing causing his L5 symptoms.  Patient reports that he actually got a little bit worse after the injection and then somewhat better but did not ultimately get much relief from the injection itself.  He has not had specific focused physical therapy.  He has no history of lumbar spine surgery.  He does carry a diagnosis of anxiety.  He is not on any anticoagulation.  He has had no bowel or bladder difficulties.  MRI reviewed and report reviewed.  Shows mainly facet arthropathy at L4-5 without listhesis and mild central narrowing and central protrusion at L5-S1 but not compressive.  There was mild to moderate foraminal narrowing on  the right.  The patient's main pain is on the left.  Injection performed was on the left.  He does get mechanical complaint with rotating the hip externally.  Patient did have pelvic x-rays and left hip x-rays which were essentially normal.  Review of Systems  Musculoskeletal: Positive for back pain and joint pain.  All other systems reviewed and are negative.  Otherwise per HPI.  Assessment & Plan: Visit Diagnoses:  1. Pain in left hip   2. Spondylosis without myelopathy or radiculopathy, lumbar region   3. Weakness   4. Balance problem     Plan: Findings:  1.  Chronic worsening back pain and left hip and thigh pain worse with external  rotation but also mainly worse with getting out of the bed after he is been there for a while or getting up from a seated position with a lot of pain in the back hip and leg.  No paresthesias no weakness.  He does feel weak in general but no weakness on exam.  He did have left L5 transforaminal injection without any relief at [place].  Dr. Tamala Julian felt like it was a radicular pain.  MRI shows more foraminal narrowing on the right not the left.  Because of the nature right now just not knowing the pain generator the patient was somewhat confused with the fact that I was going over multiple issues that it could be only trying to find a differential diagnosis because that is really what we have to do is find the source of this to be able to treated and I tried to explain that to him and I think I did a good job with that.  Nonetheless I think the first step is focused physical therapy for a short course to see if this is not related to his spine but may be report related to the hip itself or any posterior structures around this.  We talked about activity modification with certain stretches that he was doing at home that he is going to stop.  He should continue to exercise like he has been doing that I think that is probably helping.  So at this point I feel like differentially this is likely either facet arthropathy at L4-5 with referral pattern in the left hip.  It does not seem to be as radicular but we cannot rule that out he does have some lateral recess narrowing at L4-5 but nothing compressive.  His spine really looks fairly good overall.  Depending on relief with the physical therapy could look at intra-articular hip injection versus MRI arthrogram of the left hip with the idea that maybe this is a labral tear.  Again at this point just trying to figure out where this pain is generated from.   2.  Balance issues were briefly addressed today.  I do not see anything in the lumbar spine that would give  him any balance difficulties at all.  I did suggest that if he was really concerned about balance and he really did not show any signs or symptoms of of any significant issue today on exam that we could have him see a neurologist or follow-up with his primary care physician for work-up on that.  Obviously treatment for balance difficulties initially would be something like physical therapy or rule out neurologic concerns.    Meds & Orders: No orders of the defined types were placed in this encounter.   Orders Placed This Encounter  Procedures  . Ambulatory referral  to Physical Therapy    Follow-up: Return in about 4 weeks (around 07/06/2019) for Consider hip injection.   Procedures: No procedures performed  No notes on file   Clinical History: MRI LUMBAR SPINE WITHOUT CONTRAST  TECHNIQUE: Multiplanar, multisequence MR imaging of the lumbar spine was performed. No intravenous contrast was administered.  COMPARISON:  Radiographs of the lumbar spine 11/16/2018  FINDINGS: Segmentation: 5 lumbar vertebrae when correlating with prior lumbar spine radiographs.  Alignment:  No significant spondylolisthesis.  Vertebrae: Vertebral body height is maintained. No marrow edema or suspicious osseous lesion.  Conus medullaris and cauda equina: Conus extends to the L1 level. No signal abnormality within the visualized distal spinal cord.  Paraspinal and other soft tissues: No abnormality identified within included portions of the abdomen/retroperitoneum. Paraspinal soft tissues within normal limits.  Disc levels:  Moderate disc degeneration at T11-T12 and L5-S1. Mild disc degeneration at the remaining levels.  T11-T12: This level is not included on axial imaging. Posterior annular fissure. Disc bulge. No significant central canal or neural foraminal narrowing.  T12-L1: Minimal disc bulge. No significant spinal canal or neural foraminal narrowing.  L1-L2: Mild facet  arthrosis. No disc herniation. No significant canal or foraminal stenosis.  L2-L3: Small disc bulge. Mild facet arthrosis. No significant spinal canal or neural foraminal narrowing.  L3-L4: Small disc bulge. Mild facet arthrosis/ligamentum flavum hypertrophy. No significant spinal canal or neural foraminal narrowing.  L4-L5: Disc bulge with superimposed tiny central disc protrusion. Moderate facet arthrosis and ligamentum flavum hypertrophy. Mild bilateral subarticular stenosis. Mild left neural foraminal narrowing.  L5-S1: Disc bulge with superimposed small central disc protrusion. Endplate spurring. Mild facet arthrosis. Minimal bilateral subarticular narrowing without significant central canal stenosis. Mild/moderate right neural foraminal narrowing.  IMPRESSION: Lumbar spondylosis as outlined.  At L4-L5, multifactorial mild bilateral subarticular stenosis. Mild left neural foraminal narrowing also present at this level.  At L5-S1, a small central disc protrusion contributes to minimal bilateral subarticular narrowing. Mild/moderate right neural foraminal narrowing also present at this level.  No significant spinal canal or neural foraminal narrowing at the remaining levels.   Electronically Signed   By: Kellie Simmering DO   On: 01/13/2019 08:29   He reports that he has never smoked. He has never used smokeless tobacco.  Recent Labs    10/15/18 1119  LABURIC 5.7    Objective:  VS:  HT:    WT:   BMI:     BP:104/70  HR:71bpm  TEMP: ( )  RESP:  Physical Exam Vitals and nursing note reviewed.  Constitutional:      General: He is not in acute distress.    Appearance: Normal appearance. He is well-developed.  HENT:     Head: Normocephalic and atraumatic.  Eyes:     Conjunctiva/sclera: Conjunctivae normal.     Pupils: Pupils are equal, round, and reactive to light.  Cardiovascular:     Rate and Rhythm: Normal rate.     Pulses: Normal pulses.      Heart sounds: Normal heart sounds.  Pulmonary:     Effort: Pulmonary effort is normal. No respiratory distress.  Musculoskeletal:     Cervical back: Normal range of motion and neck supple. No rigidity.     Right lower leg: No edema.     Left lower leg: No edema.     Comments: Patient has some pain with standing and and extending and facet loading across the lower back.  This does not reproduce his hip pain.  He does  have pain with external rotation of the hip on the left more than the right.  No pain with internal rotation.  No pain over the greater trochanters.  He has good distal strength without any clonus.  Skin:    General: Skin is warm and dry.     Findings: No erythema or rash.  Neurological:     General: No focal deficit present.     Mental Status: He is alert and oriented to person, place, and time.     Sensory: No sensory deficit.     Coordination: Coordination normal.     Gait: Gait normal.  Psychiatric:        Mood and Affect: Mood normal.        Behavior: Behavior normal.     Ortho Exam  Imaging: No results found.  Past Medical/Family/Surgical/Social History: Medications & Allergies reviewed per EMR, new medications updated. Patient Active Problem List   Diagnosis Date Noted  . Lumbar radiculopathy 12/15/2018  . Closed disp fracture of left lateral malleolus with delayed healing 10/15/2018  . Left leg pain 10/15/2018  . Anxiety 08/12/2017  . GERD (gastroesophageal reflux disease) 01/12/2017  . Macular pucker of both eyes 01/12/2017  . Hyperlipidemia 01/12/2017  . Ptosis, right 01/12/2017  . INSOMNIA, CHRONIC 08/09/2008  . PALPITATIONS, CHRONIC 10/28/2007  . Allergic rhinitis 09/25/2006   Past Medical History:  Diagnosis Date  . ALLERGIC RHINITIS 09/25/2006  . BRADYCARDIA 09/25/2006   athletic Heart rate  . CHEST PAIN UNSPECIFIED 10/28/2007   cardiology evaluation including stress test reassuring  . INSOMNIA, CHRONIC 08/09/2008  . PALPITATIONS, CHRONIC  10/28/2007   Family History  Problem Relation Age of Onset  . Aortic aneurysm Mother        never smoker  . Breast cancer Mother        stage I- mastectomy  . Cervical cancer Mother        ? uterine.   . Lung cancer Father        smoker. chemo, radiation. surgery not available- spots shrunk some. depressed mood  . Scoliosis Sister        also some head trauma after abuse from spouse  . Other Son        X-linked gammaglobulinemia. monthly infusions  . Healthy Son   . Other Maternal Grandmother        unilateral kidney but lived to 62  . Heart attack Paternal Grandfather        younger than 73- but patients dad has not had issues   Past Surgical History:  Procedure Laterality Date  . NASAL SEPTUM SURGERY     Social History   Occupational History  . Occupation: Games developer: [employer]  Tobacco Use  . Smoking status: Never Smoker  . Smokeless tobacco: Never Used  Substance and Sexual Activity  . Alcohol use: Yes    Alcohol/week: 5.0 standard drinks    Types: 5 Glasses of wine per week    Comment: 3-4 glasses per week, then some wine on weekend  . Drug use: No  . Sexual activity: Not on file

## 2019-06-13 ENCOUNTER — Encounter: Payer: Self-pay | Admitting: Physical Therapy

## 2019-06-13 ENCOUNTER — Ambulatory Visit (INDEPENDENT_AMBULATORY_CARE_PROVIDER_SITE_OTHER): Payer: No Typology Code available for payment source | Admitting: Physical Therapy

## 2019-06-13 ENCOUNTER — Other Ambulatory Visit: Payer: Self-pay

## 2019-06-13 DIAGNOSIS — M25552 Pain in left hip: Secondary | ICD-10-CM | POA: Diagnosis not present

## 2019-06-13 DIAGNOSIS — R29898 Other symptoms and signs involving the musculoskeletal system: Secondary | ICD-10-CM | POA: Diagnosis not present

## 2019-06-13 DIAGNOSIS — M6281 Muscle weakness (generalized): Secondary | ICD-10-CM

## 2019-06-13 NOTE — Patient Instructions (Signed)
Access Code: [email or web address] URL: [email or web address] Date: 06/13/2019 Prepared by: Faustino Congress  Exercises Side Lunge Adductor Stretch - 1 x daily - 7 x weekly - 1 sets - 3 reps - 30 hold Seated Hip Flexor Stretch - 2 x daily - 7 x weekly - 3 reps - 1 sets - 30 sec hold Hip Hiking on Step - 1 x daily - 7 x weekly - 3 sets - 10 reps - 3-5 sec hold  Patient Education Trigger Point Dry Needling

## 2019-06-13 NOTE — Therapy (Signed)
[facility] Physical Therapy [address] [place], [place], [address] Phone: [phone]   Fax:  [phone]  Physical Therapy Evaluation  Patient Details  Name: Jay Combs MRN: [id number] Date of Birth: [birth year] Referring Provider (PT): Magnus Sinning, MD   Encounter Date: 06/13/2019  PT End of Session - 06/13/19 1143    Visit Number  1    Number of Visits  12    Date for PT Re-Evaluation  07/25/19    PT Start Time  0845    PT Stop Time  0930    PT Time Calculation (min)  45 min    Activity Tolerance  Patient tolerated treatment well    Behavior During Therapy  [facility] for tasks assessed/performed       Past Medical History:  Diagnosis Date  . ALLERGIC RHINITIS 09/25/2006  . BRADYCARDIA 09/25/2006   athletic Heart rate  . CHEST PAIN UNSPECIFIED 10/28/2007   cardiology evaluation including stress test reassuring  . INSOMNIA, CHRONIC 08/09/2008  . PALPITATIONS, CHRONIC 10/28/2007    Past Surgical History:  Procedure Laterality Date  . NASAL SEPTUM SURGERY      There were no vitals filed for this visit.   Subjective Assessment - 06/13/19 0846    Subjective  Pt is a 64 y/o male who presents to OPPT for 6 month hx of pain in Lt thigh with sitting with Lt foot up on thigh.  Pt had MRI of lumbar spine, had L5 lumbar injection with min relief.  Pt reports feeling weak and stiff with getting up in the morning, and goes to the gym 4-5 times a week doing the ellipitical for 3 miles each day.  Pt also with new onset of Rt medial joint of knee.    Pertinent History  n/a    Limitations  Sitting    How long can you sit comfortably?  60 min    Diagnostic tests  MRI lumbar spine: OA, hip xray normal    Patient Stated Goals  improve pain and symptoms    Currently in Pain?  Yes    Pain Score  0-No pain   up to 7/10   Pain Location  Hip    Pain Orientation  Left;Right    Pain Descriptors / Indicators  Constant   pulling   Pain Type  Acute pain;Chronic pain     Pain Onset  More than a month ago    Pain Frequency  Intermittent    Aggravating Factors   sitting with LLE in hip external rotation    Pain Relieving Factors  avoiding provoking positions         [facility] PT Assessment - 06/13/19 0853      Assessment   Medical Diagnosis  M25.552 (ICD-10-CM) - Pain in left hip    Referring Provider (PT)  Magnus Sinning, MD    Onset Date/Surgical Date  --   6 months   Hand Dominance  Right    Next MD Visit  07/14/19    Prior Therapy  none      Precautions   Precautions  None      Restrictions   Weight Bearing Restrictions  No      Balance Screen   Has the patient fallen in the past 6 months  Yes    How many times?  1    Has the patient had a decrease in activity level because of a fear of falling?   No    Is the patient reluctant to  leave their home because of a fear of falling?   No      Home Environment   Living Environment  Private residence    Living Arrangements  Spouse/significant other    Type of [place] Access  Level entry    Home Layout  Two level;Bed/bath upstairs    Additional Comments  feels like stairs can be difficult due to weakness/fatigue      Prior Function   Level of Independence  Independent    Vocation  Full time employment;Self employed    Advertising account planner, owns a small business (desk work, behind Chief Executive Officer) - no heavy lifting    Leisure  gym 5 days a week       Cognition   Overall Cognitive Status  Within Functional Limits for tasks assessed      ROM / Strength   AROM / PROM / Strength  AROM;Strength      AROM   Overall AROM Comments  WNL bil hips      Strength   Strength Assessment Site  Hip;Knee    Right/Left Hip  Right;Left    Right Hip Flexion  5/5    Right Hip Extension  5/5    Right Hip External Rotation   4/5    Right Hip Internal Rotation  4/5    Right Hip ABduction  5/5    Left Hip Flexion  4/5    Left Hip Extension  4/5    Left Hip External Rotation  4+/5    Left  Hip Internal Rotation  4/5    Left Hip ABduction  3+/5    Left Hip ADduction  4/5    Right/Left Knee  Right;Left    Right Knee Flexion  5/5    Right Knee Extension  5/5    Left Knee Flexion  5/5    Left Knee Extension  5/5      Palpation   Palpation comment  trigger points noted in Lt adductors, hip flexors, glute med/min      Special Tests    Special Tests  Lumbar;Hip Special Tests    Lumbar Tests  Slump Test;Straight Leg Raise    Hip Special Tests   Saralyn Pilar (FABER) Test;Hip Scouring      Slump test   Findings  Negative      Straight Leg Raise   Findings  Negative      Saralyn Pilar (FABER) Test   Findings  Positive      Hip Scouring   Findings  Negative                Objective measurements completed on examination: See above findings.      [place] [facility] PT Treatment/Exercise - 06/13/19 1140      Exercises   Exercises  Knee/Hip      Knee/Hip Exercises: Stretches   Hip Flexor Stretch  Left;1 rep;30 seconds   seated   Other Knee/Hip Stretches  Lt adductor stretch x30 sec standing      Knee/Hip Exercises: Standing   Other Standing Knee Exercises  Lt hip hike for glute med activation 10x5 sec -mod cues for technique             PT Education - 06/13/19 1142    Education Details  HEP, DN, POC, clinical findings    Person(s) Educated  Patient    Methods  Explanation;Demonstration;Handout    Comprehension  Verbalized understanding;Returned demonstration;Need further instruction  PT Long Term Goals - 06/13/19 1252      PT LONG TERM GOAL #1   Title  independent with HEP    Status  New    Target Date  07/25/19      PT LONG TERM GOAL #2   Title  demonstrate 4/5 Lt hip strength for improved function    Status  New    Target Date  07/25/19      PT LONG TERM GOAL #3   Title  report 50% improvement in pain with sitting for improved sitting tolerance    Status  New    Target Date  07/25/19             Plan - 06/13/19 1246     Clinical Impression Statement  Pt is a 64 y/o male who presents to OPPT for Lt > Rt sided hip and leg pain.  Pt with positive FABER test, negative Slump and SLR testing, as well as hip weakness and trigger points noted in adductors, hip flexors and glute med/min.  Symptoms likely multifactorial including musculoskeletal injury and/or hip impingement, and will benefit from PT to address deficits noted.  If no progress made in 3-4 weeks will follow up with MD for further assessment.    Examination-Activity Limitations  Sit;Stairs;Squat    Stability/Clinical Decision Making  Evolving/Moderate complexity    Clinical Decision Making  Low    Rehab Potential  Good    PT Frequency  2x / week   1-2x/wk   PT Duration  6 weeks    PT Treatment/Interventions  ADLs/Self Care Home Management;Cryotherapy;Ultrasound;Moist Heat;Iontophoresis 4mg /ml Dexamethasone;Electrical Stimulation;Gait training;Stair training;Functional mobility training;Therapeutic activities;Patient/family education;Therapeutic exercise;Balance training;Neuromuscular re-education;Taping;Dry needling    PT Next Visit Plan  DN to adductors/hip flexors/glutes, review HEP and progress hip strengthening    PT Home Exercise Plan  Access Code: [email or web address]    Consulted and Agree with Plan of Care  Patient       Patient will benefit from skilled therapeutic intervention in order to improve the following deficits and impairments:  Increased fascial restricitons, Increased muscle spasms, Pain, Decreased strength  Visit Diagnosis: Pain in left hip - Plan: PT plan of care cert/re-cert  Muscle weakness (generalized) - Plan: PT plan of care cert/re-cert  Other symptoms and signs involving the musculoskeletal system - Plan: PT plan of care cert/re-cert     Problem List Patient Active Problem List   Diagnosis Date Noted  . Lumbar radiculopathy 12/15/2018  . Closed disp fracture of left lateral malleolus with delayed healing 10/15/2018  . Left leg  pain 10/15/2018  . Anxiety 08/12/2017  . GERD (gastroesophageal reflux disease) 01/12/2017  . Macular pucker of both eyes 01/12/2017  . Hyperlipidemia 01/12/2017  . Ptosis, right 01/12/2017  . INSOMNIA, CHRONIC 08/09/2008  . PALPITATIONS, CHRONIC 10/28/2007  . Allergic rhinitis 09/25/2006     Laureen Abrahams, PT, DPT 06/13/19 12:57 PM     [facility] Physical Therapy [address] [place], [place], [address] Phone: [phone]   Fax:  [phone]  Name: Jay Combs MRN: [id number] Date of Birth: 06/22/1955

## 2019-06-24 ENCOUNTER — Encounter: Payer: No Typology Code available for payment source | Admitting: Rehabilitative and Restorative Service Providers"

## 2019-06-27 ENCOUNTER — Other Ambulatory Visit: Payer: Self-pay

## 2019-06-27 ENCOUNTER — Ambulatory Visit (INDEPENDENT_AMBULATORY_CARE_PROVIDER_SITE_OTHER): Payer: No Typology Code available for payment source | Admitting: Physical Therapy

## 2019-06-27 ENCOUNTER — Encounter: Payer: Self-pay | Admitting: Physical Therapy

## 2019-06-27 DIAGNOSIS — R29898 Other symptoms and signs involving the musculoskeletal system: Secondary | ICD-10-CM | POA: Diagnosis not present

## 2019-06-27 DIAGNOSIS — M6281 Muscle weakness (generalized): Secondary | ICD-10-CM | POA: Diagnosis not present

## 2019-06-27 DIAGNOSIS — M25552 Pain in left hip: Secondary | ICD-10-CM | POA: Diagnosis not present

## 2019-06-27 NOTE — Therapy (Signed)
[facility] Physical Therapy [address] [place], [place], [address] Phone: [phone]   Fax:  [phone]  Physical Therapy Treatment  Patient Details  Name: Jay Combs MRN: [id number] Date of Birth: 21-Jan-1956 Referring Provider (PT): Magnus Sinning, MD   Encounter Date: 06/27/2019  PT End of Session - 06/27/19 1600    Visit Number  2    Number of Visits  12    Date for PT Re-Evaluation  07/25/19    PT Start Time  [id number]    PT Stop Time  1355    PT Time Calculation (min)  40 min    Activity Tolerance  Patient tolerated treatment well    Behavior During Therapy  [facility] for tasks assessed/performed       Past Medical History:  Diagnosis Date  . ALLERGIC RHINITIS 09/25/2006  . BRADYCARDIA 09/25/2006   athletic Heart rate  . CHEST PAIN UNSPECIFIED 10/28/2007   cardiology evaluation including stress test reassuring  . INSOMNIA, CHRONIC 08/09/2008  . PALPITATIONS, CHRONIC 10/28/2007    Past Surgical History:  Procedure Laterality Date  . NASAL SEPTUM SURGERY      There were no vitals filed for this visit.  Subjective Assessment - 06/27/19 1518    Subjective  had to cancel last week, so hasn't had DN yet.  does feel like exercises are helping and leg is less painful.    Pertinent History  n/a    Limitations  Sitting    How long can you sit comfortably?  60 min    Diagnostic tests  MRI lumbar spine: OA, hip xray normal    Patient Stated Goals  improve pain and symptoms    Currently in Pain?  No/denies    Pain Onset  More than a month ago                        [facility] Adult PT Treatment/Exercise - 06/27/19 1518      Knee/Hip Exercises: Stretches   Hip Flexor Stretch  Left;2 reps;30 seconds    Hip Flexor Stretch Limitations  min cues for technique    Other Knee/Hip Stretches  Lt adductor stretch x30 sec each position - standing, supine and seated for modifications      Knee/Hip Exercises: Aerobic   Nustep  L6 x 6 min (LEs only)       Knee/Hip Exercises: Standing   Other Standing Knee Exercises  reviewed Lt hip hike exercises      Manual Therapy   Manual Therapy  Soft tissue mobilization    Manual therapy comments  skilled palpation and monitoring of soft tissue during DN    Soft tissue mobilization  IASTM to Lt adductors       Trigger Point Dry Needling - 06/27/19 1559    Consent Given?  Yes    Education Handout Provided  Yes    Muscles Treated Lower Quadrant  Adductor longus/brevis/magnus    Adductor Response  Twitch response elicited;Palpable increased muscle length           PT Education - 06/27/19 1600    Education Details  adductor stretch modifications    Person(s) Educated  Patient    Methods  Explanation;Demonstration;Handout    Comprehension  Verbalized understanding;Returned demonstration;Need further instruction          PT Long Term Goals - 06/13/19 1252      PT LONG TERM GOAL #1   Title  independent with HEP    Status  New    Target Date  07/25/19      PT LONG TERM GOAL #2   Title  demonstrate 4/5 Lt hip strength for improved function    Status  New    Target Date  07/25/19      PT LONG TERM GOAL #3   Title  report 50% improvement in pain with sitting for improved sitting tolerance    Status  New    Target Date  07/25/19            Plan - 06/27/19 1600    Clinical Impression Statement  Pt with positive response to DN and manual therapy today with no pain in provoking position following DN and manual therapy.  Overall reporting improvement in symptoms with current HEP and hopeful DN will add additional benefit.    Examination-Activity Limitations  Sit;Stairs;Squat    Stability/Clinical Decision Making  Evolving/Moderate complexity    Rehab Potential  Good    PT Frequency  2x / week   1-2x/wk   PT Duration  6 weeks    PT Treatment/Interventions  ADLs/Self Care Home Management;Cryotherapy;Ultrasound;Moist Heat;Iontophoresis 4mg /ml Dexamethasone;Electrical  Stimulation;Gait training;Stair training;Functional mobility training;Therapeutic activities;Patient/family education;Therapeutic exercise;Balance training;Neuromuscular re-education;Taping;Dry needling    PT Next Visit Plan  assess response to DN to adductors, consider DN to hip flexors/glutes, review HEP and progress hip strengthening    PT Home Exercise Plan  Access Code: [email or web address]    Consulted and Agree with Plan of Care  Patient       Patient will benefit from skilled therapeutic intervention in order to improve the following deficits and impairments:  Increased fascial restricitons, Increased muscle spasms, Pain, Decreased strength  Visit Diagnosis: Pain in left hip  Muscle weakness (generalized)  Other symptoms and signs involving the musculoskeletal system     Problem List Patient Active Problem List   Diagnosis Date Noted  . Lumbar radiculopathy 12/15/2018  . Closed disp fracture of left lateral malleolus with delayed healing 10/15/2018  . Left leg pain 10/15/2018  . Anxiety 08/12/2017  . GERD (gastroesophageal reflux disease) 01/12/2017  . Macular pucker of both eyes 01/12/2017  . Hyperlipidemia 01/12/2017  . Ptosis, right 01/12/2017  . INSOMNIA, CHRONIC 08/09/2008  . PALPITATIONS, CHRONIC 10/28/2007  . Allergic rhinitis 09/25/2006      Laureen Abrahams, PT, DPT 06/27/19 4:04 PM    [facility] Physical Therapy [address] [place], [place], [address] Phone: [phone]   Fax:  [phone]  Name: Brittain Mormon MRN: [id number] Date of Birth: January 22, 1956

## 2019-06-27 NOTE — Patient Instructions (Signed)
Access Code: [email or web address] URL: [email or web address] Date: 06/13/2019 Prepared by: Faustino Congress  Exercises Side Lunge Adductor Stretch - 1 x daily - 7 x weekly - 1 sets - 3 reps - 30 hold Seated Hip Flexor Stretch - 2 x daily - 7 x weekly - 3 reps - 1 sets - 30 sec hold Hip Hiking on Step - 1 x daily - 7 x weekly - 3 sets - 10 reps - 3-5 sec hold Butterfly Groin Stretch - 1 x daily - 7 x weekly - 1 sets - 3 reps - 30 sec hold Hip Adductors and Hamstring Stretch with Strap - 1 x daily - 7 x weekly - 1 sets - 3 reps - 30 sec hold  Patient Education Trigger Point Dry Needling

## 2019-06-30 ENCOUNTER — Encounter: Payer: Self-pay | Admitting: Family Medicine

## 2019-07-08 ENCOUNTER — Encounter: Payer: Self-pay | Admitting: Physical Therapy

## 2019-07-08 ENCOUNTER — Other Ambulatory Visit: Payer: Self-pay

## 2019-07-08 ENCOUNTER — Ambulatory Visit (INDEPENDENT_AMBULATORY_CARE_PROVIDER_SITE_OTHER): Payer: No Typology Code available for payment source | Admitting: Physical Therapy

## 2019-07-08 DIAGNOSIS — M25552 Pain in left hip: Secondary | ICD-10-CM | POA: Diagnosis not present

## 2019-07-08 DIAGNOSIS — R29898 Other symptoms and signs involving the musculoskeletal system: Secondary | ICD-10-CM | POA: Diagnosis not present

## 2019-07-08 DIAGNOSIS — M6281 Muscle weakness (generalized): Secondary | ICD-10-CM

## 2019-07-08 NOTE — Therapy (Addendum)
[facility] Physical Therapy [address] [place], [place], [address] Phone: [phone]   Fax:  [phone]  Physical Therapy Treatment/Discharge Summary  Patient Details  Name: Jay Combs MRN: [id number] Date of Birth: 06/09/1955 Referring Provider (PT): Magnus Sinning, MD   Encounter Date: 07/08/2019  PT End of Session - 07/08/19 0915    Visit Number  3    Number of Visits  12    Date for PT Re-Evaluation  07/25/19    PT Start Time  0802    PT Stop Time  5053    PT Time Calculation (min)  45 min    Activity Tolerance  Patient tolerated treatment well    Behavior During Therapy  [facility] for tasks assessed/performed       Past Medical History:  Diagnosis Date  . ALLERGIC RHINITIS 09/25/2006  . BRADYCARDIA 09/25/2006   athletic Heart rate  . CHEST PAIN UNSPECIFIED 10/28/2007   cardiology evaluation including stress test reassuring  . INSOMNIA, CHRONIC 08/09/2008  . PALPITATIONS, CHRONIC 10/28/2007    Past Surgical History:  Procedure Laterality Date  . NASAL SEPTUM SURGERY      There were no vitals filed for this visit.  Subjective Assessment - 07/08/19 0803    Subjective  not having a great week.  had a pretty good week, then early this week started having increased symptoms.  did a 6 mile walk on Sunday, went to gym on Mon and has had progressive aching symptoms since then.    Pertinent History  n/a    Limitations  Sitting    How long can you sit comfortably?  60 min    Diagnostic tests  MRI lumbar spine: OA, hip xray normal    Patient Stated Goals  improve pain and symptoms    Currently in Pain?  Yes    Pain Score  2     Pain Location  Leg    Pain Orientation  Anterior;Left    Pain Descriptors / Indicators  Aching;Constant    Pain Type  Acute pain;Chronic pain    Pain Onset  More than a month ago    Pain Frequency  Intermittent    Aggravating Factors   constant ache    Pain Relieving Factors  elliptical         [facility] PT Assessment -  07/08/19 0914      Assessment   Medical Diagnosis  M25.552 (ICD-10-CM) - Pain in left hip    Referring Provider (PT)  Magnus Sinning, MD      [name]   Findings  Positive                    [facility] Adult PT Treatment/Exercise - 07/08/19 0808      Knee/Hip Exercises: Stretches   Quad Stretch  Left;3 reps;30 seconds   prone with strap   Other Knee/Hip Stretches  Lt supine adductor stretch 3x30 sec; supine with strap    Other Knee/Hip Stretches  prone on elbows x 3 min      Knee/Hip Exercises: Standing   Other Standing Knee Exercises  Lt lateral shift 10x10 sec      Manual Therapy   Manual Therapy  Soft tissue mobilization;Joint mobilization    Manual therapy comments  skilled palpation and monitoring of soft tissue during DN    Joint Mobilization  Lt hip P/A Grades 2-3, long axis distraction    Soft tissue mobilization  Lt adductors and quads  Trigger Point Dry Needling - 07/08/19 0913    Consent Given?  Yes    Education Handout Provided  Previously provided    Muscles Treated Lower Quadrant  Vastus medialis;Rectus femoris    Rectus femoris Response  Twitch response elicited    Vastus medialis Response  Twitch response elicited                PT Long Term Goals - 07/08/19 0915      PT LONG TERM GOAL #1   Title  independent with HEP    Status  On-going    Target Date  07/25/19      PT LONG TERM GOAL #2   Title  demonstrate 4/5 Lt hip strength for improved function    Status  On-going    Target Date  07/25/19      PT LONG TERM GOAL #3   Title  report 50% improvement in pain with sitting for improved sitting tolerance    Status  On-going    Target Date  07/25/19            Plan - 07/08/19 0915    Clinical Impression Statement  Pt reporting improvement in initial symptoms and now with diffuse deep aching to Lt thigh.  Extension based exercises resulted in no change in symptoms.  Manual therapy, DN and stretching seemed  to help decrease symptoms at end of session.  Overall pt continues to be active and discussed gradual increase in activity to decrease injury risk.  Pt verbalized understanding.  To MD next week to determine next steps.    Examination-Activity Limitations  Sit;Stairs;Squat    Stability/Clinical Decision Making  Evolving/Moderate complexity    Rehab Potential  Good    PT Frequency  2x / week   1-2x/wk   PT Duration  6 weeks    PT Treatment/Interventions  ADLs/Self Care Home Management;Cryotherapy;Ultrasound;Moist Heat;Iontophoresis 68m/ml Dexamethasone;Electrical Stimulation;Gait training;Stair training;Functional mobility training;Therapeutic activities;Patient/family education;Therapeutic exercise;Balance training;Neuromuscular re-education;Taping;Dry needling    PT Next Visit Plan  assess response to DN to adductors, consider DN to hip flexors/glutes, review HEP and progress hip strengthening    PT Home Exercise Plan  Access Code: [email or web address]    Consulted and Agree with Plan of Care  Patient       Patient will benefit from skilled therapeutic intervention in order to improve the following deficits and impairments:  Increased fascial restricitons, Increased muscle spasms, Pain, Decreased strength  Visit Diagnosis: Pain in left hip  Muscle weakness (generalized)  Other symptoms and signs involving the musculoskeletal system     Problem List Patient Active Problem List   Diagnosis Date Noted  . Lumbar radiculopathy 12/15/2018  . Closed disp fracture of left lateral malleolus with delayed healing 10/15/2018  . Left leg pain 10/15/2018  . Anxiety 08/12/2017  . GERD (gastroesophageal reflux disease) 01/12/2017  . Macular pucker of both eyes 01/12/2017  . Hyperlipidemia 01/12/2017  . Ptosis, right 01/12/2017  . INSOMNIA, CHRONIC 08/09/2008  . PALPITATIONS, CHRONIC 10/28/2007  . Allergic rhinitis 09/25/2006      SLaureen Abrahams PT, DPT 07/08/19 9:19 AM     [facility] [address] [address]Phone: [phone]  Fax:  [phone] Name: SLydell MogaMRN: [id number] of Birth: [birth year]     PHYSICAL THERAPY DISCHARGE SUMMARY  Visits from Start of Care: 3  Current functional level related to goals / functional outcomes: See above   Remaining deficits: unknown  Education / Equipment: HEP  Plan: Patient agrees to discharge.  Patient goals were not met. Patient is being discharged due to not returning since the last visit.  ?????    Laureen Abrahams, PT, DPT 09/21/19 1:47 PM  [facility] Physical Therapy [address] [place], [place], [address] Phone: [phone]   Fax:  [phone]

## 2019-07-12 ENCOUNTER — Encounter: Payer: Self-pay | Admitting: Physical Medicine and Rehabilitation

## 2019-07-14 ENCOUNTER — Ambulatory Visit: Payer: No Typology Code available for payment source | Admitting: Physical Medicine and Rehabilitation

## 2019-07-21 ENCOUNTER — Encounter: Payer: Self-pay | Admitting: Physical Therapy

## 2019-07-28 ENCOUNTER — Encounter: Payer: Self-pay | Admitting: Physical Medicine and Rehabilitation

## 2019-08-05 ENCOUNTER — Encounter: Payer: Self-pay | Admitting: Family Medicine

## 2019-08-05 ENCOUNTER — Ambulatory Visit (INDEPENDENT_AMBULATORY_CARE_PROVIDER_SITE_OTHER): Payer: No Typology Code available for payment source | Admitting: Family Medicine

## 2019-08-05 ENCOUNTER — Ambulatory Visit: Payer: Self-pay

## 2019-08-05 ENCOUNTER — Other Ambulatory Visit: Payer: Self-pay

## 2019-08-05 DIAGNOSIS — M25562 Pain in left knee: Secondary | ICD-10-CM

## 2019-08-05 NOTE — Patient Instructions (Signed)
    Pes Anserine Bursa  - Ice 15-20 minutes after walks - Voltaren Gel 4 times daily as needed. - Stretch hamstrings - Strengthen inner quads (straight leg raises)  Vitamin D3:  Take 5,000 IU daily Vitamin K2:  Take 100 mcg daily Magnesium:  Take 200-400 mg daily
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
## 2019-08-24 ENCOUNTER — Encounter: Payer: Self-pay | Admitting: Family Medicine

## 2019-08-24 DIAGNOSIS — M25552 Pain in left hip: Secondary | ICD-10-CM

## 2019-08-24 DIAGNOSIS — M25562 Pain in left knee: Secondary | ICD-10-CM

## 2019-08-25 NOTE — Addendum Note (Signed)
Addended by: Hortencia Pilar on: 08/25/2019 08:24 AM   Modules accepted: Orders

## 2019-09-15 NOTE — Addendum Note (Signed)
Addended by: Hortencia Pilar on: 09/15/2019 08:23 AM   Modules accepted: Orders

## 2019-09-19 ENCOUNTER — Other Ambulatory Visit: Payer: No Typology Code available for payment source

## 2019-10-04 ENCOUNTER — Ambulatory Visit
Admission: RE | Admit: 2019-10-04 | Discharge: 2019-10-04 | Disposition: A | Discharge status: Home / Self Care | Payer: No Typology Code available for payment source | Source: Ambulatory Visit | Attending: Family Medicine | Admitting: Family Medicine

## 2019-10-04 ENCOUNTER — Telehealth: Payer: Self-pay | Admitting: Family Medicine

## 2019-10-04 ENCOUNTER — Other Ambulatory Visit: Payer: Self-pay

## 2019-10-04 DIAGNOSIS — M25562 Pain in left knee: Secondary | ICD-10-CM

## 2019-10-04 DIAGNOSIS — M25552 Pain in left hip: Secondary | ICD-10-CM

## 2019-10-04 NOTE — Telephone Encounter (Signed)
Knee MRI is unremarkable.  No meniscus tear, minimal if any arthritis.  There's a small benign-appearing ganglion cyst above the knee, not in the area of tenderness.  Hip MRI shows mild arthritis.  There's a substantial amount of tendon irritation/tendinopathy where the gluteus tendons attach to the femur bone.  This could be a cause of lateral hip pain, but not sure whether it would be the source of the knee pain.

## 2019-10-05 NOTE — Telephone Encounter (Signed)
Sent message through MyChart to the patient. Awaiting response.

## 2019-10-05 NOTE — Telephone Encounter (Signed)
I spoke to the patient about his recent knee and hip MRI results.  Unfortunately we still do not have a clear explanation for his chronic knee pain.  He does have pain in the lateral hip, but it feels fine when he is doing elliptical trainer.  It bothers him a lot when he first gets up in the morning and starts walking.  I do not think the tendinopathy in the hip would explain his medial side knee pain.  There is a small ganglion cyst above the knee joint, but most of his knee pain is below the knee joint on the medial side.  We discussed several treatment options.  The plan at this point is to consider dextrose prolotherapy for his hip tendinopathy, probably 2 or 3 treatments.  If he is not making progress after a couple treatments, then a one-time cortisone injection.  At the time of his first procedure, I will also look at the ganglion cyst with ultrasound and determine whether it would be amenable to aspiration.

## 2019-10-13 ENCOUNTER — Ambulatory Visit: Payer: Self-pay

## 2019-10-13 ENCOUNTER — Ambulatory Visit (INDEPENDENT_AMBULATORY_CARE_PROVIDER_SITE_OTHER): Payer: No Typology Code available for payment source | Admitting: Family Medicine

## 2019-10-13 ENCOUNTER — Encounter: Payer: Self-pay | Admitting: Family Medicine

## 2019-10-13 ENCOUNTER — Other Ambulatory Visit: Payer: Self-pay

## 2019-10-13 VITALS — BP 108/68 | HR 62 | Temp 98.2°F | Ht 66.0 in | Wt 145.0 lb

## 2019-10-13 DIAGNOSIS — M25562 Pain in left knee: Secondary | ICD-10-CM

## 2019-10-13 DIAGNOSIS — H6983 Other specified disorders of Eustachian tube, bilateral: Secondary | ICD-10-CM

## 2019-10-13 DIAGNOSIS — M25552 Pain in left hip: Secondary | ICD-10-CM | POA: Diagnosis not present

## 2019-10-13 MED ORDER — FLUTICASONE PROPIONATE 50 MCG/ACT NA SUSP: 2.0000 | Freq: Every day | NASAL | 0 refills | Status: DC

## 2019-10-13 NOTE — Patient Instructions (Addendum)
Health Maintenance Due  Topic Date Due  . COLONOSCOPY will call for appointment when delta calms down. Consider cologuard- let me know if you prefer this if no family history 11/10/2018  . INFLUENZA VACCINE will call later to make appointment  09/11/2019   Try flonase for 2-3 weeks. If not improving happy to refer you to Dr. Constance Holster if needed. I think there is some irritation in your eustachian tubes- hoping if we reduce inflammation will improve. You could also try a neil med sinus rinse if you would like  Recommended follow up: Return in about 5- 6 months (around 04/11/2020) for physical or sooner if needed.

## 2019-10-13 NOTE — Progress Notes (Signed)
Phone [phone] In person visit   Subjective:   Jay Combs is a 64 y.o. year old very pleasant male patient who presents for/with See problem oriented charting Chief Complaint  Patient presents with  . Ear Fullness   This visit occurred during the SARS-CoV-2 public health emergency.  Safety protocols were in place, including screening questions prior to the visit, additional usage of staff PPE, and extensive cleaning of exam room while observing appropriate contact time as indicated for disinfecting solutions.   Past Medical History-  Patient Active Problem List   Diagnosis Date Noted  . Lumbar radiculopathy 12/15/2018    Priority: Medium  . Anxiety 08/12/2017    Priority: Medium  . GERD (gastroesophageal reflux disease) 01/12/2017    Priority: Medium  . Hyperlipidemia 01/12/2017    Priority: Medium  . Ptosis, right 01/12/2017    Priority: Medium  . PALPITATIONS, CHRONIC 10/28/2007    Priority: Medium  . Left leg pain 10/15/2018    Priority: Low  . Macular pucker of both eyes 01/12/2017    Priority: Low  . Closed disp fracture of left lateral malleolus with delayed healing 10/15/2018    Medications- reviewed and updated Current Outpatient Medications  Medication Sig Dispense Refill  . Cholecalciferol (VITAMIN D3) 25 MCG (1000 UT) CAPS Take by mouth daily.    Marland Kitchen MAGNESIUM PO Take by mouth.    . Misc Natural Products (TART CHERRY ADVANCED PO) Take by mouth 2 (two) times daily.    Marland Kitchen VITAMIN K PO Take by mouth.    . fluticasone (FLONASE) 50 MCG/ACT nasal spray Place 2 sprays into both nostrils daily. 16 g 0   No current facility-administered medications for this visit.     Objective:  BP 108/68   Pulse 62   Temp 98.2 F (36.8 C) (Temporal)   Ht 5\' 6"  (1.676 m)   Wt 145 lb (65.8 kg)   SpO2 97%   BMI 23.40 kg/m  Gen: NAD, resting comfortably Very mild amount of cerumen at the base of both ear canals-able to remove some of this with curette-nonobstructive and  do not think associated with symptoms he has experienced.  CV: RRR  Lungs: nonlabored, normal respiratory rate Abdomen: soft/nondistended      Assessment and Plan   #Bilateral ear fullness-right more than left S: started after swimming about a week ago. Right more than left feels like water is stuck in the ear.  He swallows sometimes he feels some movement in the ear.  No pain. No drainage from the ear. No fevers. No cough or congestion above baseline.    A/P: His exam is normal.  I showed him how to auto insufflate and this did provide some temporary relief.  We discussed possible eustachian tube dysfunction or even otitis media with effusion.   patient reports intermittent nasal congestion for some time not recently worse-this could certainly contribute and we opted to trial Flonase for 2 to 3 weeks.  Could also try sinus rinse.  If no improvement discussed possible ear nose and throat referral-he has relationship with Dr. Constance Holster and would refer to him.  #Bilateral thigh pain left worse than right Patient reported pains in both thighs starting he believes after COVID-19 vaccination in March.  We reviewed records and patient actually saw me in August 2020 about this and I referred him to Dr. Tamala Julian of sports medicine.  Dr. Tamala Julian saw him starting in 2020 and this predated COVID-19 vaccination.  Patient denies any worsening symptoms around time  of March and after discussion with Dr. Symptoms less likely related to COVID-19 vaccination.  MRI of lumbar spine for these issues obtained December 2020. -Patient initially followed with Dr. Tamala Julian and then later Dr. Ernestina Patches and even tried epidural injections without improvement. Saw PT and they found tight musculature in the thigh.  He is now working with Dr. Junius Roads to try to sort through these issues as well as some knee and hip issues-was considering hip injection today but was not having any hip pain today opted out -Interestingly Still able to do ellipitical  4-5 days a week- does not worsen fatigue or pain feeling. Pain worse with prolonged sitting and in the AM.  Has also had vascular work-up which was reassuring. -Dr. Derry Lory gave exercises and patient would like to see how he does with these as next step -I did send a message to Dr. Junius Roads reviewing time course above and how I thought this made COVID-19 vaccination reaction less likely   Recommended follow up: Return in about 6 months (around 04/11/2020) for physical or sooner if needed.  Lab/Order associations:   ICD-10-CM   1. Dysfunction of both eustachian tubes  H69.83     Meds ordered this encounter  Medications  . fluticasone (FLONASE) 50 MCG/ACT nasal spray    Sig: Place 2 sprays into both nostrils daily.    Dispense:  16 g    Refill:  0   Time Spent: 48 minutes of total time (4:58 PM-5:36 PM, 9:00 PM-9:10 PM) was spent on the date of the encounter performing the following actions: chart review prior to seeing the patient, obtaining history, performing a medically necessary exam, counseling on the treatment plan, placing orders, and documenting in our EHR, as well as communicating with sports medicine/orthopedic physician Dr. Junius Roads (less than 5 minutes on this portion)  Return precautions advised.  Garret Reddish, MD

## 2019-10-13 NOTE — Progress Notes (Signed)
Office Visit Note   Patient: Jay Combs           Date of Birth: Dec 20, 1955           MRN: [id number] Visit Date: 10/13/2019 Requested by: [name], MD [place],  [place] [address] PCP: [name], MD  Subjective: Chief Complaint  Patient presents with  . Left Hip - Pain    prolotherapy for left hip tendonopathy  . Left Knee - Cyst    Possible aspiration    HPI: He is here for follow-up left leg pain. He is here for possible hip and knee injections. Patient is frustrated that we are still unable to pinpoint what is causing his pain. His recent hip MRI shows evidence of gluteus medius tendinopathy, but patient does not have any pain in that area. His knee MRI scan showed a ganglion cyst on the posterior aspect of the medial femoral condyle, but he is not having pain in that area either. He believes his symptoms started in March. He is questioning whether it might be a side effect of the Covid vaccines. He thinks that after having his first vaccine, that is when his symptoms really started. He describes a "weakness" in his legs when he is trying to go upstairs. He has a vague pain in his leg that is hard to pinpoint. He feels like his overall energy is diminished as well.               ROS:   All other systems were reviewed and are negative.  Objective: Vital Signs: There were no vitals taken for this visit.  Physical Exam:  General:  Alert and oriented, in no acute distress. Pulm:  Breathing unlabored. Psy:  Normal mood, congruent affect.  Left hip: There is little tenderness to palpation of the greater trochanter at the gluteus medius insertion, but he has no pain with resisted strength testing or with walking. No pain with internal hip rotation. Left knee: When I palpate posterior medial aspect of the femoral condyle, I can feel the area of the ganglion cyst but he is not significantly tender here either.  Imaging: US Guided Needle Placement -  No Linked Charges  Result Date: 10/13/2019 I briefly imaged the posterior medial left knee and was able to see the ganglion cyst documented on MRI scan. It is very small, and has not significantly tender when palpated under ultrasound.   Assessment & Plan: 1. Chronic left leg pain, etiology uncertain. Cannot rule out reaction to Covid vaccines. - Elected not to inject today.      Procedures: No procedures performed  No notes on file     PMFS History: Patient Active Problem List   Diagnosis Date Noted  . Lumbar radiculopathy 12/15/2018  . Closed disp fracture of left lateral malleolus with delayed healing 10/15/2018  . Left leg pain 10/15/2018  . Anxiety 08/12/2017  . GERD (gastroesophageal reflux disease) 01/12/2017  . Macular pucker of both eyes 01/12/2017  . Hyperlipidemia 01/12/2017  . Ptosis, right 01/12/2017  . INSOMNIA, CHRONIC 08/09/2008  . PALPITATIONS, CHRONIC 10/28/2007  . Allergic rhinitis 09/25/2006   Past Medical History:  Diagnosis Date  . ALLERGIC RHINITIS 09/25/2006  . BRADYCARDIA 09/25/2006   athletic Heart rate  . CHEST PAIN UNSPECIFIED 10/28/2007   cardiology evaluation including stress test reassuring  . INSOMNIA, CHRONIC 08/09/2008  . PALPITATIONS, CHRONIC 10/28/2007    Family History  Problem Relation Age of Onset  . Aortic  aneurysm Mother        never smoker  . Breast cancer Mother        stage I- mastectomy  . Cervical cancer Mother        ? uterine.   . Lung cancer Father        smoker. chemo, radiation. surgery not available- spots shrunk some. depressed mood  . Scoliosis Sister        also some head trauma after abuse from spouse  . Other Son        X-linked gammaglobulinemia. monthly infusions  . Healthy Son   . Other Maternal Grandmother        unilateral kidney but lived to 35  . Heart attack Paternal Grandfather        younger than 64- but patients dad has not had issues    Past Surgical History:  Procedure Laterality Date    . NASAL SEPTUM SURGERY     Social History   Occupational History  . Occupation: Games developer: [employer]  Tobacco Use  . Smoking status: Never Smoker  . Smokeless tobacco: Never Used  Vaping Use  . Vaping Use: Never used  Substance and Sexual Activity  . Alcohol use: Yes    Alcohol/week: 5.0 standard drinks    Types: 5 Glasses of wine per week    Comment: 3-4 glasses per week, then some wine on weekend  . Drug use: No  . Sexual activity: Not on file

## 2019-10-14 ENCOUNTER — Encounter: Payer: Self-pay | Admitting: Family Medicine

## 2019-10-14 DIAGNOSIS — F419 Anxiety disorder, unspecified: Secondary | ICD-10-CM

## 2019-10-14 MED ORDER — ALPRAZOLAM 0.25 MG PO TABS: 0.2500 mg | ORAL_TABLET | Freq: Two times a day (BID) | ORAL | 0 refills | Status: AC | PRN

## 2019-10-26 DIAGNOSIS — H938X3 Other specified disorders of ear, bilateral: Secondary | ICD-10-CM | POA: Insufficient documentation

## 2019-12-15 ENCOUNTER — Ambulatory Visit: Payer: No Typology Code available for payment source

## 2020-01-03 ENCOUNTER — Encounter: Payer: Self-pay | Admitting: Gastroenterology

## 2020-01-16 ENCOUNTER — Telehealth: Payer: Self-pay

## 2020-01-16 ENCOUNTER — Encounter: Payer: Self-pay | Admitting: Family Medicine

## 2020-01-16 ENCOUNTER — Other Ambulatory Visit: Payer: Self-pay

## 2020-01-16 ENCOUNTER — Telehealth (INDEPENDENT_AMBULATORY_CARE_PROVIDER_SITE_OTHER): Payer: No Typology Code available for payment source | Admitting: Family Medicine

## 2020-01-16 VITALS — Temp 97.5°F

## 2020-01-16 DIAGNOSIS — R509 Fever, unspecified: Secondary | ICD-10-CM

## 2020-01-16 DIAGNOSIS — Z20822 Contact with and (suspected) exposure to covid-19: Secondary | ICD-10-CM

## 2020-01-16 NOTE — Addendum Note (Signed)
Addended by: Thomes Cake on: 01/16/2020 03:35 PM   Modules accepted: Orders

## 2020-01-16 NOTE — Progress Notes (Signed)
Phone [phone] Virtual visit via Video note   Subjective:  Chief complaint: Chief Complaint  Patient presents with  . Cough, Fever, Runny Nose   This visit type was conducted due to national recommendations for restrictions regarding the COVID-19 Pandemic (e.g. social distancing).  This format is felt to be most appropriate for this patient at this time balancing risks to patient and risks to population by having him in for in person visit.  No physical exam was performed (except for noted visual exam or audio findings with Telehealth visits).    Our team/I connected with Sheran Fava Weimar at  2:20 PM EST by a video enabled telemedicine application (doxy.me or caregility through epic) and verified that I am speaking with the correct person using two identifiers.  Location patient: [place] Location provider: [facility], office Persons participating in the virtual visit:  patient  Our team/I discussed the limitations of evaluation and management by telemedicine and the availability of in person appointments. In light of current covid-19 pandemic, patient also understands that we are trying to protect them by minimizing in office contact if at all possible.  The patient expressed consent for telemedicine visit and agreed to proceed. Patient understands insurance will be billed.   Past Medical History-  Patient Active Problem List   Diagnosis Date Noted  . Lumbar radiculopathy 12/15/2018    Priority: Medium  . Anxiety 08/12/2017    Priority: Medium  . GERD (gastroesophageal reflux disease) 01/12/2017    Priority: Medium  . Hyperlipidemia 01/12/2017    Priority: Medium  . Ptosis, right 01/12/2017    Priority: Medium  . PALPITATIONS, CHRONIC 10/28/2007    Priority: Medium  . Left leg pain 10/15/2018    Priority: Low  . Macular pucker of both eyes 01/12/2017    Priority: Low  . Closed disp fracture of left lateral malleolus with delayed healing 10/15/2018    Medications- reviewed  and updated Current Outpatient Medications  Medication Sig Dispense Refill  . Cholecalciferol (VITAMIN D3) 25 MCG (1000 UT) CAPS Take by mouth daily.    Marland Kitchen MAGNESIUM PO Take by mouth.    . Misc Natural Products (TART CHERRY ADVANCED PO) Take by mouth 2 (two) times daily.    . fluticasone (FLONASE) 50 MCG/ACT nasal spray Place 2 sprays into both nostrils daily. 16 g 0  . VITAMIN K PO Take by mouth.     No current facility-administered medications for this visit.     Objective:  Temp (!) 97.5 F (36.4 C) (Oral)  self reported vitals Gen: NAD, resting comfortably Lungs: nonlabored, normal respiratory rate  Skin: appears dry, no obvious rash     Assessment and Plan   # Fever, Runny Nose and Watery eyes S: Patient mentioned that his symptoms started this past weekend- Saturday afternoon. He stated that it started off as a runny nose and watery eyes. some sinus pressure.slight sore throat has improved Minimal cough starting today.  Last night is when he developed a fever of 100.7 and some chills and runny nose and watery eyes worsened. His current temp 97.5. He has taken otc Zicam. He stated that he took a rapid covid test this morning that came back negative. He has not received his covid booster shot.   No  congestion, runny nose, shortness of breath, fatigue, body aches,  nausea, vomiting, diarrhea, or new loss of taste or smell. No known contacts with covid 19 or someone being tested for covid 19.  No contacts with flu or covid  he is aware of.  A/P: Patient with symptoms concerning for potential covid 19 (vaccinated but no booster) or influenza (vaccinated)  Therefore: - testing options discussed with patient 1. Potential daytime testing- will do flu and covid test here - recommended patient watch closely for shortness of breath or confusion or worsening symptoms and if those occur he should contact us immediately  -recommended patient consider purchasing pulse oximeter and if levels  94% or below persistently- seek care at the hospital  -recommended self isolation until negative test  at minimum .  - for quarantine if covid 19 test positive would need to be at least 10 days since first symptom AND at least 24 hours fever free without fever reducing medications AND improvement in respiratory symptoms  -Hopeful results back within 48 hours of test but may take up to a week  - work note needed: No  Recommended follow up: as needed for acute concerns Future Appointments  Date Time Provider [place]  02/24/2020  8:30 AM [facility] PRE[facility]  03/09/2020  8:00 AM Ladene Artist, MD [facility]    Lab/Order associations:   ICD-10-CM   1. Fever, unspecified fever cause  R50.9   2. Suspected COVID-19 virus infection  Z20.822     Time Spent: 14 minutes of total time (2:33 PM- 2:47 PM) was spent on the date of the encounter performing the following actions: chart review prior to seeing the patient, obtaining history, performing a medically necessary exam, counseling on the treatment plan, placing orders, and documenting in our EHR.   Return precautions advised.  Garret Reddish, MD

## 2020-01-16 NOTE — Telephone Encounter (Signed)
Please see patient advice message today if you have any questions.

## 2020-01-16 NOTE — Patient Instructions (Signed)
Health Maintenance Due  Topic Date Due  . COLONOSCOPY  11/10/2018  . INFLUENZA VACCINE  09/11/2019    Depression screen [facility] 2/9 04/01/2019 09/28/2018 09/28/2018  Decreased Interest 0 0 0  Down, Depressed, Hopeless 0 2 1  PHQ - 2 Score 0 2 1  Altered sleeping 3 3 -  Tired, decreased energy 0 0 -  Change in appetite 0 0 -  Feeling bad or failure about yourself  0 1 -  Trouble concentrating 0 0 -  Moving slowly or fidgety/restless 0 1 -  Suicidal thoughts 0 0 -  PHQ-9 Score 3 7 -  Difficult doing work/chores Not difficult at all Not difficult at all -    Recommended follow up: No follow-ups on file.

## 2020-01-16 NOTE — Telephone Encounter (Signed)
Patient has been scheduled for a Virtual visit

## 2020-01-17 LAB — POCT INFLUENZA A/B
Influenza A, POC: NEGATIVE
Influenza B, POC: NEGATIVE

## 2020-01-17 NOTE — Addendum Note (Signed)
Addended by: Thomes Cake on: 01/17/2020 07:59 AM   Modules accepted: Orders

## 2020-01-18 ENCOUNTER — Encounter: Payer: Self-pay | Admitting: Family Medicine

## 2020-01-19 ENCOUNTER — Telehealth: Payer: Self-pay

## 2020-01-19 LAB — NOVEL CORONAVIRUS, NAA

## 2020-01-19 NOTE — Telephone Encounter (Signed)
Patient called in saying he called in yesterday about covid results, and was told it was negative but got a mychart message from [place] that his results were still pending. He has already gone back to work, and when at work he is not required to wear a mask and is close to a bunch of people. Jay Combs is concerned as what he needs to do, and why he was told it was negative but it really wasn't.

## 2020-01-19 NOTE — Telephone Encounter (Signed)
Spoke with patient today- see phone note. He will go back to quarantine until gets hopefully negative result from [facility]. He is feeling better overall with last fever on Sunday.

## 2020-01-19 NOTE — Telephone Encounter (Signed)
Patient states he spoke to one of our staff members and was told had a negative test and since our main concern was for COVID-19 he believed this was related to COVID-19.  There is no documentation of the chart of someone speaking with him about these results.  I told him I would get the back to our practice administrator that conversations with patients that always be documented.  He later received a note from me stating results were still pending for COVID-19 which prompted him to call back.  He then discovered that his negative test was for flu and not for COVID-19.  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  He is very concerned he may have exposed someone based off of information of a negative test.  I told him I would alert our practice administrator to investigate the matter fully and also give feedback that our staff member should always be documenting discussions with patients.  I apologized to him for the inconvenience.  I also recommended retesting-he is already scheduled for 4 PM with [facility].  Given I have not previously seen a similar result for COVID-19 as his-see result note-I think is reasonable to be tested at another site.  He was concerned he may not have been tested appropriately but when I explained the anterior nare testing he states that is how his test was completed.  He appreciated the phone call but obviously is still apprehensive about potentially having exposed to coworkers based off the information provided from our office which was not adequately documented

## 2020-01-22 ENCOUNTER — Encounter: Payer: Self-pay | Admitting: Family Medicine

## 2020-02-08 ENCOUNTER — Encounter: Payer: Self-pay | Admitting: *Deleted

## 2020-02-11 HISTORY — PX: COLONOSCOPY: SHX174

## 2020-02-15 ENCOUNTER — Encounter: Payer: Self-pay | Admitting: Family Medicine

## 2020-02-22 ENCOUNTER — Ambulatory Visit (INDEPENDENT_AMBULATORY_CARE_PROVIDER_SITE_OTHER): Payer: 59 | Admitting: Physician Assistant

## 2020-02-22 ENCOUNTER — Encounter: Payer: Self-pay | Admitting: Physician Assistant

## 2020-02-22 ENCOUNTER — Other Ambulatory Visit: Payer: Self-pay

## 2020-02-22 VITALS — BP 100/60 | HR 76 | Temp 97.5°F | Ht 66.0 in | Wt 147.0 lb

## 2020-02-22 DIAGNOSIS — R229 Localized swelling, mass and lump, unspecified: Secondary | ICD-10-CM | POA: Diagnosis not present

## 2020-02-22 DIAGNOSIS — R000 Tachycardia, unspecified: Secondary | ICD-10-CM | POA: Diagnosis not present

## 2020-02-22 NOTE — Patient Instructions (Signed)
It was great to see you!  I think your symptoms are secondary to your recent booster. I anticipate that they will improve and resolve with time.  Please let me or Dr. [name] know if you develop new symptoms such as fever, chills, fatigue, worsening redness, new pain.   I typically do not do further work-up for this until symptoms persist for 4 weeks or more. Of course if your symptoms change before then, we will address accordingly.  Take care,  Inda Coke PA-C
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
## 2020-02-24 ENCOUNTER — Other Ambulatory Visit: Payer: Self-pay

## 2020-02-24 ENCOUNTER — Ambulatory Visit (AMBULATORY_SURGERY_CENTER): Payer: 59 | Admitting: *Deleted

## 2020-02-24 VITALS — Ht 66.0 in | Wt 142.0 lb

## 2020-02-24 DIAGNOSIS — Z1211 Encounter for screening for malignant neoplasm of colon: Secondary | ICD-10-CM

## 2020-02-24 MED ORDER — NA SULFATE-K SULFATE-MG SULF 17.5-3.13-1.6 GM/177ML PO SOLN: ORAL | 0 refills | Status: DC

## 2020-02-24 NOTE — Progress Notes (Signed)
Patient's pre-visit was done today over the phone with the patient due to COVID-19 pandemic. Name,DOB and address verified. Insurance verified. Packet of Prep instructions mailed to patient including a copy of a consent form and pre-procedure patient acknowledgement form-pt is aware.  Patient understands to call us back with any questions or concerns. COVID-19 vaccines completed on 02/18/2020, per patient. Pt is aware that care partner can wait in the car during procedure or they may wait in the 4 th floor lobby. Patient is aware to bring only one care partner. Patient and care partner will wear a mask into building.

## 2020-03-07 ENCOUNTER — Encounter: Payer: Self-pay | Admitting: Family Medicine

## 2020-03-07 ENCOUNTER — Telehealth: Payer: Self-pay | Admitting: Gastroenterology

## 2020-03-07 NOTE — Telephone Encounter (Signed)
Hi Dr. Fuller Plan., this patient just called to cancel procedure that was scheduled on Friday 03/08/20 because he stated to be battling a bad cold. Patient has rescheduled to 04/20/20. Thank you.

## 2020-03-09 ENCOUNTER — Encounter: Payer: No Typology Code available for payment source | Admitting: Gastroenterology

## 2020-03-15 ENCOUNTER — Encounter: Payer: Self-pay | Admitting: Family Medicine

## 2020-04-20 ENCOUNTER — Ambulatory Visit (AMBULATORY_SURGERY_CENTER): Payer: 59 | Admitting: Gastroenterology

## 2020-04-20 ENCOUNTER — Other Ambulatory Visit: Payer: Self-pay

## 2020-04-20 ENCOUNTER — Telehealth: Payer: Self-pay

## 2020-04-20 ENCOUNTER — Encounter: Payer: Self-pay | Admitting: Gastroenterology

## 2020-04-20 VITALS — BP 106/71 | HR 60 | Temp 97.9°F | Resp 27 | Ht 66.0 in | Wt 142.0 lb

## 2020-04-20 DIAGNOSIS — Z1211 Encounter for screening for malignant neoplasm of colon: Secondary | ICD-10-CM | POA: Diagnosis present

## 2020-04-20 DIAGNOSIS — D123 Benign neoplasm of transverse colon: Secondary | ICD-10-CM | POA: Diagnosis not present

## 2020-04-20 MED ORDER — SODIUM CHLORIDE 0.9 % IV SOLN: 500.0000 mL | Freq: Once | INTRAVENOUS | Status: DC

## 2020-04-20 NOTE — Progress Notes (Signed)
Vitals by CW 

## 2020-04-20 NOTE — Telephone Encounter (Signed)
Patient has been scheduled for EGD Bravo pH probe for 06/12/20 and pre-visit on 05/29/20 with Dr. Silverio Decamp.

## 2020-04-20 NOTE — Progress Notes (Signed)
Called to room to assist during endoscopic procedure.  Patient ID and intended procedure confirmed with present staff. Received instructions for my participation in the procedure from the performing physician.  

## 2020-04-20 NOTE — Patient Instructions (Signed)
Information on polyps and hemorrhoids given to you today.  Await pathology results.  Resume previous diet and medications.  Schedule EGD with Bravo off of all acid reducing medications.    YOU HAD AN ENDOSCOPIC PROCEDURE TODAY AT [facility]:   Refer to the procedure report that was given to you for any specific questions about what was found during the examination.  If the procedure report does not answer your questions, please call your gastroenterologist to clarify.  If you requested that your care partner not be given the details of your procedure findings, then the procedure report has been included in a sealed envelope for you to review at your convenience later.  YOU SHOULD EXPECT: Some feelings of bloating in the abdomen. Passage of more gas than usual.  Walking can help get rid of the air that was put into your GI tract during the procedure and reduce the bloating. If you had a lower endoscopy (such as a colonoscopy or flexible sigmoidoscopy) you may notice spotting of blood in your stool or on the toilet paper. If you underwent a bowel prep for your procedure, you may not have a normal bowel movement for a few days.  Please Note:  You might notice some irritation and congestion in your nose or some drainage.  This is from the oxygen used during your procedure.  There is no need for concern and it should clear up in a day or so.  SYMPTOMS TO REPORT IMMEDIATELY:   Following lower endoscopy (colonoscopy or flexible sigmoidoscopy):  Excessive amounts of blood in the stool  Significant tenderness or worsening of abdominal pains  Swelling of the abdomen that is new, acute  Fever of 100F or higher   Following upper endoscopy (EGD)  Vomiting of blood or coffee ground material  New chest pain or pain under the shoulder blades  Painful or persistently difficult swallowing  New shortness of breath  Fever of 100F or higher  Black, tarry-looking stools  For urgent or  emergent issues, a gastroenterologist can be reached at any hour by calling [phone]. Do not use MyChart messaging for urgent concerns.    DIET:  We do recommend a small meal at first, but then you may proceed to your regular diet.  Drink plenty of fluids but you should avoid alcoholic beverages for 24 hours.  ACTIVITY:  You should plan to take it easy for the rest of today and you should NOT DRIVE or use heavy machinery until tomorrow (because of the sedation medicines used during the test).    FOLLOW UP: Our staff will call the number listed on your records 48-72 hours following your procedure to check on you and address any questions or concerns that you may have regarding the information given to you following your procedure. If we do not reach you, we will leave a message.  We will attempt to reach you two times.  During this call, we will ask if you have developed any symptoms of COVID 19. If you develop any symptoms (ie: fever, flu-like symptoms, shortness of breath, cough etc.) before then, please call [phone].  If you test positive for Covid 19 in the 2 weeks post procedure, please call and report this information to [place].    If any biopsies were taken you will be contacted by phone or by letter within the next 1-3 weeks.  Please call us at [phone] if you have not heard about the biopsies in 3 weeks.  SIGNATURES/CONFIDENTIALITY: You and/or your care partner have signed paperwork which will be entered into your electronic medical record.  These signatures attest to the fact that that the information above on your After Visit Summary has been reviewed and is understood.  Full responsibility of the confidentiality of this discharge information lies with you and/or your care-partner.

## 2020-04-20 NOTE — Progress Notes (Signed)
PT taken to PACU. Monitors in place. VSS. Report given to RN. 

## 2020-04-20 NOTE — Op Note (Addendum)
[place] Patient Name: Jay Combs Procedure Date: 04/20/2020 7:27 AM MRN: [id number] Endoscopist: Ladene Artist , MD Age: 65 Referring MD:  Date of Birth: 1955-03-02 Gender: Male Account #: [id number] Procedure:                Colonoscopy Indications:              Screening for colorectal malignant neoplasm Medicines:                Monitored Anesthesia Care Procedure:                Pre-Anesthesia Assessment:                           - Prior to the procedure, a History and Physical                            was performed, and patient medications and                            allergies were reviewed. The patient's tolerance of                            previous anesthesia was also reviewed. The risks                            and benefits of the procedure and the sedation                            options and risks were discussed with the patient.                            All questions were answered, and informed consent                            was obtained. Prior Anticoagulants: The patient has                            taken no previous anticoagulant or antiplatelet                            agents. ASA Grade Assessment: II - A patient with                            mild systemic disease. After reviewing the risks                            and benefits, the patient was deemed in                            satisfactory condition to undergo the procedure.                           After obtaining informed consent, the colonoscope  was passed under direct vision. Throughout the                            procedure, the patient's blood pressure, pulse, and                            oxygen saturations were monitored continuously. The                            Olympus CF-[id number] (Serial# [id number]) Colonoscope was                            introduced through the anus and advanced to the the                            cecum,  identified by appendiceal orifice and                            ileocecal valve. The ileocecal valve, appendiceal                            orifice, and rectum were photographed. The quality                            of the bowel preparation was excellent. The                            colonoscopy was performed without difficulty. The                            patient tolerated the procedure well. Scope In: 8:07:05 AM Scope Out: 8:21:07 AM Scope Withdrawal Time: 0 hours 11 minutes 36 seconds  Total Procedure Duration: 0 hours 14 minutes 2 seconds  Findings:                 The perianal and digital rectal examinations were                            normal.                           A 3 mm polyp was found in the transverse colon. The                            polyp was sessile. The polyp was removed with a                            cold biopsy forceps. Resection and retrieval were                            complete.                           Internal hemorrhoids were found during  retroflexion. The hemorrhoids were moderate and                            Grade I (internal hemorrhoids that do not prolapse).                           The exam was otherwise without abnormality on                            direct and retroflexion views. Complications:            No immediate complications. Estimated blood loss:                            None. Estimated Blood Loss:     Estimated blood loss: none. Impression:               - One 3 mm polyp in the transverse colon, removed                            with a cold biopsy forceps. Resected and retrieved.                           - Internal hemorrhoids.                           - The examination was otherwise normal on direct                            and retroflexion views. Recommendation:           - Repeat colonoscopy after studies are complete for                            surveillance based on pathology  results.                           - Patient has a contact number available for                            emergencies. The signs and symptoms of potential                            delayed complications were discussed with the                            patient. Return to normal activities tomorrow.                            Written discharge instructions were provided to the                            patient.                           - Resume previous diet.                           -  Continue present medications.                           - Await pathology results.                           - Schedule EGD with Bravo off all acid reducing                            medications. Ladene Artist, MD 04/20/2020 8:36:08 AM This report has been signed electronically.

## 2020-04-20 NOTE — Telephone Encounter (Signed)
-----   Message from Mauri Pole, MD sent at 04/20/2020  3:59 PM EST ----- Hi Malcolm,  Yes, happy to do it.  Shayanne Gomm, can you please help schedule it. I can call patient to discuss if has any specific questions regarding bravo but I will also go over in detail when he comes in for his procedure. Thanks Margarette Asal  ----- Message ----- From: Ladene Artist, MD Sent: 04/20/2020   1:31 PM EST To: Marlon Pel, RN, Mauri Pole, MD  Harvin Hazel,   I saw this patient for colonoscopy today and he relayed that his dentist is concerned he has reflux related enamel damage. He was treated with PPIs for GERD several years ago and his EGD in 2014 was normal. The patient has not been on any reflux meds for several years and has no GERD symptoms for several years.  He does not want to take meds for reflux unless was are more sure he has GERD. He wants an evaluation to assess for GERD. I think he is a good candidate for a Bravo study off all acid reducing meds. I have not completed my Bravo privileges and wondered if you would perform his EGD with Bravo. He and his wife are both my patients.   Thanks,   Norberto Sorenson

## 2020-04-25 ENCOUNTER — Telehealth: Payer: Self-pay

## 2020-04-25 NOTE — Telephone Encounter (Signed)
LVM

## 2020-05-01 ENCOUNTER — Encounter: Payer: Self-pay | Admitting: Gastroenterology

## 2020-05-02 ENCOUNTER — Telehealth: Payer: Self-pay

## 2020-05-02 DIAGNOSIS — F458 Other somatoform disorders: Secondary | ICD-10-CM

## 2020-05-02 NOTE — Telephone Encounter (Signed)
Ok to wait until may or refer to pulmonary?

## 2020-05-02 NOTE — Telephone Encounter (Signed)
Pt states his dentist told him that he may have Sleep Apnea, due to some things he noticed with his teeth. Pt wants to get evaluated. Next OV opening is in May. Should I schedule then or use Same Day?

## 2020-05-03 NOTE — Telephone Encounter (Signed)
Patient states that he doesn't snore or have any daytime sleepiness. States that his dentist seen some grinding of his teeth that's what he needs the referral for.

## 2020-05-03 NOTE — Telephone Encounter (Signed)
Need diagnosis code to refer. Does he also snore or have daytime sleepiness? If so can use one of those to refer

## 2020-05-03 NOTE — Telephone Encounter (Signed)
Referral placed.

## 2020-05-03 NOTE — Addendum Note (Signed)
Addended by: Clyde Lundborg A on: 05/03/2020 01:41 PM   Modules accepted: Orders

## 2020-05-03 NOTE — Telephone Encounter (Signed)
May place referral under bruxism to pulmonary-please place in the note that patient's dentist specifically requested evaluation for teeth grinding/bruxism for OSA and we are simply helping patient coordinate

## 2020-05-17 NOTE — Progress Notes (Signed)
Phone [phone] In person visit   Subjective:   Jay Combs is a 65 y.o. year old very pleasant male patient who presents for/with See problem oriented charting Chief Complaint  Patient presents with  . Memory Loss    Has been ongoing for about a year.  . Sleep Apnea    Dentist recommend he address this  . Tachycardia    Started January 8th, in middle of chest. Little tightness, pounding heart more than racing.   This visit occurred during the SARS-CoV-2 public health emergency.  Safety protocols were in place, including screening questions prior to the visit, additional usage of staff PPE, and extensive cleaning of exam room while observing appropriate contact time as indicated for disinfecting solutions.   Past Medical History-  Patient Active Problem List   Diagnosis Date Noted  . Lumbar radiculopathy 12/15/2018    Priority: Medium  . Anxiety 08/12/2017    Priority: Medium  . GERD (gastroesophageal reflux disease) 01/12/2017    Priority: Medium  . Hyperlipidemia 01/12/2017    Priority: Medium  . Ptosis, right 01/12/2017    Priority: Medium  . PALPITATIONS, CHRONIC 10/28/2007    Priority: Medium  . Left leg pain 10/15/2018    Priority: Low  . Macular pucker of both eyes 01/12/2017    Priority: Low  . Sensation of fullness in both ears 10/26/2019  . Closed disp fracture of left lateral malleolus with delayed healing 10/15/2018    Medications- reviewed and updated Current Outpatient Medications  Medication Sig Dispense Refill  . Cholecalciferol (VITAMIN D3) 25 MCG (1000 UT) CAPS Take by mouth daily.    . Misc Natural Products (TART CHERRY ADVANCED PO) Take by mouth 2 (two) times daily.    . Omega-3 Fatty Acids (FISH OIL PO) Take 3,000 mg by mouth daily in the afternoon.    Marland Kitchen MAGNESIUM PO Take by mouth. (Patient not taking: Reported on 05/18/2020)     No current facility-administered medications for this visit.     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 He also had a form he had to complete - thought he had not filled it out but he had filled it out. More forgetful on whether he shut the heat off at night. Does not feel like working as much as he used to. Business has been somewhat stressful- but slightly better lately.   No depressed mood. No anhedonia- went on a trip recently and really enjoyed it.   Memory issues have been a about a year or more- we checked mini cog 09/28/2018.  A/P: mini cog again today normal 2/3 on delayed recall with normal clock draw.  - we will check b12 and tsh, cbc, cmp -offered neurocognitive testing with [facility]- he declines -if new or worsening  symptoms he will let me know  # Teeth decay S: Patient recently saw his dentist and was told had some abnormal decay of his teeth-thought potentially related either acid reflux but patient has minimal symptoms or sleep apnea.  Patient is undergoing some testing with Dr. Fuller Plan at present around potential reflux A/P: We discussed potentially referring to pulmonology for sleep apnea testing but we want to get the results back from Dr. Lynne Leader test first-he will reach out to [place] if that test is unrevealing and I can place a referral  #Heart Racing/palpitations #Nonexertional chest pain S:symptoms  Date back at least 5-6 months now that he thinks further on this. Feels a hard heart beat  in his chest. Will wake up in the morning and note it. Gets a sensation of tightness in upper chest- taking deep breaths can help slightly. Episodes can last a few minutes up to a half hour. Never happens when he is exercising- exercises 4 days a week- did 3 miles elliptical with high incline today and had HR to 168 and felt no issues. No shortness of breath with episodes. Symptoms worsened today when coming to the doctor. Chest pain not relieved by rest. Feels like a mild upper chest pressure- as if his anxious and needs a deep breath no shortness of breath, left arm or neck pain, diaphoresis, nausea, dizziness. Symptoms not associated with meals.   Has seen Dr. [name] in past for palpitations- reassuring echocardiogram 03/21/11 and 11/03/13 A/P: Palpitations could certainly be anxiety related. He is having nonexertional chest pain and EKG is reassuring-we discussed this does not firmly rule out cardiac disease.  I offered a referral to cardiology but he has seen them a few times in the past and had a work-up and wants to hold off for now- Dr. [name] in past.  As an alternative with ASCVD risk slightly above 7.5% we discussed doing coronary calcium scoring-he opts in for this.  Also we will update blood work including CBC, CMP, TSH which has overlap for memory concerns to see if there is any obvious trigger.  Recommended follow up: keep may visit Future Appointments  Date Time Provider [place]  05/29/2020 10:00 AM [facility] PREVISIT RM52 [facility]  06/12/2020  9:00 AM [facility] RECORDER 1 [facility]  06/12/2020 10:00 AM Mauri Pole, MD [facility]  07/05/2020  8:20 AM [name], Brayton Mars, MD [facility]    Lab/Order associations:   ICD-10-CM   1. Tightness in chest  R07.89 EKG 12-Lead  2. Hyperlipidemia, unspecified hyperlipidemia type  E78.5 CBC with Differential/Platelet    Comprehensive metabolic panel    CT CARDIAC SCORING (SELF PAY ONLY)  3. Memory loss  R41.3  Vitamin B12    TSH   Time Spent: 38 minutes of total time (1:00 PM- 1:40 PM with 2 minutes spent on EKG interpreation) was spent on the date of the encounter performing the following actions: chart review prior to seeing the patient, obtaining history, performing a medically necessary exam, counseling on the treatment plan, placing orders, and documenting in our EHR.   Return precautions advised.  Garret Reddish, MD

## 2020-05-17 NOTE — Patient Instructions (Addendum)
Please stop by lab before you go If you have mychart- we will send your results within 3 business days of [place] receiving them.  If you do not have mychart- we will call you about results within 5 business days of [place] receiving them.  *please also note that you will see labs on mychart as soon as they post. I will later go in and write notes on them- will say "notes from Dr. [name]"   If you have new or worsening symptoms please let us know-if you have worsening chest pain or new symptoms such as shortness of breath left arm or neck pain, abnormal sweating, nausea-please seek care immediately  Recommended follow up: keep may visit

## 2020-05-18 ENCOUNTER — Other Ambulatory Visit: Payer: Self-pay

## 2020-05-18 ENCOUNTER — Ambulatory Visit: Payer: 59 | Admitting: Family Medicine

## 2020-05-18 ENCOUNTER — Encounter: Payer: Self-pay | Admitting: Family Medicine

## 2020-05-18 VITALS — BP 120/82 | HR 81 | Temp 98.0°F | Ht 66.0 in | Wt 140.6 lb

## 2020-05-18 DIAGNOSIS — E785 Hyperlipidemia, unspecified: Secondary | ICD-10-CM | POA: Diagnosis not present

## 2020-05-18 DIAGNOSIS — R0789 Other chest pain: Secondary | ICD-10-CM

## 2020-05-18 DIAGNOSIS — R413 Other amnesia: Secondary | ICD-10-CM

## 2020-05-18 LAB — COMPREHENSIVE METABOLIC PANEL
ALT: 17 U/L (ref 0–53)
AST: 20 U/L (ref 0–37)
Albumin: 4.3 g/dL (ref 3.5–5.2)
Alkaline Phosphatase: 43 U/L (ref 39–117)
BUN: 20 mg/dL (ref 6–23)
CO2: 30 mEq/L (ref 19–32)
Calcium: 9.6 mg/dL (ref 8.4–10.5)
Chloride: 99 mEq/L (ref 96–112)
Creatinine, Ser: 1.01 mg/dL (ref 0.40–1.50)
GFR: 79.61 mL/min (ref >60.00)
Glucose, Bld: 81 mg/dL (ref 70–99)
Potassium: 3.9 mEq/L (ref 3.5–5.1)
Sodium: 135 mEq/L (ref 135–145)
Total Bilirubin: 0.9 mg/dL (ref 0.2–1.2)
Total Protein: 6.7 g/dL (ref 6.0–8.3)

## 2020-05-18 LAB — CBC WITH DIFFERENTIAL/PLATELET
Basophils Absolute: 0 10*3/uL (ref 0.0–0.1)
Basophils Relative: 0.5 % (ref 0.0–3.0)
Eosinophils Absolute: 0.1 10*3/uL (ref 0.0–0.7)
Eosinophils Relative: 1.6 % (ref 0.0–5.0)
HCT: 41 % (ref 39.0–52.0)
Hemoglobin: 13.7 g/dL (ref 13.0–17.0)
Lymphocytes Relative: 28.9 % (ref 12.0–46.0)
Lymphs Abs: 1.9 10*3/uL (ref 0.7–4.0)
MCHC: 33.4 g/dL (ref 30.0–36.0)
MCV: 91.1 fl (ref 78.0–100.0)
Monocytes Absolute: 0.6 10*3/uL (ref 0.1–1.0)
Monocytes Relative: 9 % (ref 3.0–12.0)
Neutro Abs: 4 10*3/uL (ref 1.4–7.7)
Neutrophils Relative %: 60 % (ref 43.0–77.0)
Platelets: 216 10*3/uL (ref 150.0–400.0)
RBC: 4.5 Mil/uL (ref 4.22–5.81)
RDW: 13.9 % (ref 11.5–15.5)
WBC: 6.7 10*3/uL (ref 4.0–10.5)

## 2020-05-18 LAB — VITAMIN B12: Vitamin B-12: 397 pg/mL (ref 211–911)

## 2020-05-18 LAB — TSH: TSH: 1.77 u[IU]/mL (ref 0.35–4.50)

## 2020-05-25 ENCOUNTER — Encounter: Payer: Self-pay | Admitting: Family Medicine

## 2020-05-29 ENCOUNTER — Other Ambulatory Visit: Payer: Self-pay

## 2020-05-29 ENCOUNTER — Ambulatory Visit (AMBULATORY_SURGERY_CENTER): Payer: 59

## 2020-05-29 ENCOUNTER — Telehealth: Payer: Self-pay | Admitting: Gastroenterology

## 2020-05-29 VITALS — Ht 66.0 in | Wt 140.0 lb

## 2020-05-29 DIAGNOSIS — R002 Palpitations: Secondary | ICD-10-CM

## 2020-05-29 DIAGNOSIS — K219 Gastro-esophageal reflux disease without esophagitis: Secondary | ICD-10-CM

## 2020-05-29 NOTE — Telephone Encounter (Signed)
Patient rescheduled as he requested to 6/13

## 2020-05-29 NOTE — Telephone Encounter (Signed)
Pls call pt. He needs to r/s bravo test.

## 2020-05-29 NOTE — Progress Notes (Signed)
Pre visit completed via phone call; patient verified name, DOB, and address;  No egg or soy allergy known to patient  No issues with past sedation with any surgeries or procedures Patient denies ever being told they had issues or difficulty with intubation  No FH of Malignant Hyperthermia No diet pills per patient No home 02 use per patient  No blood thinners per patient  Pt denies issues with constipation  No A fib or A flutter  EMMI video via MyChart  COVID 19 guidelines implemented in [place] today with Pt and RN  Pt is fully vaccinated for Covid x 2 + booster Due to the COVID-19 pandemic we are asking patients to follow certain guidelines.  Pt aware of COVID protocols and [facility] guidelines

## 2020-05-30 ENCOUNTER — Telehealth: Payer: Self-pay | Admitting: Cardiovascular Disease

## 2020-05-30 NOTE — Telephone Encounter (Signed)
Patient c/o Palpitations:  High priority if patient c/o lightheadedness, shortness of breath, or chest pain  1) How long have you had palpitations/irregular HR/ Afib? Are you having the symptoms now? Has had for a few months, not having symptoms now   2) Are you currently experiencing lightheadedness, SOB or CP? No   3) Do you have a history of afib (atrial fibrillation) or irregular heart rhythm? No   4) Have you checked your BP or HR? (document readings if available): No   5) Are you experiencing any other symptoms? Sweating when waking up with it at night    Called to schedule pt's referral. Patient stated he is still wanting to see Dr. [name], I advised him he is still considered an established patient and offered the first available with him on 09/14/20. Patient stated that is not acceptable due to his current symptoms. I then advised I can schedule him for sooner with a PA and pt stated he only wants to see Dr. [name]. He requested this message be sent for him to be worked in. He reports his palpitations have been occurring more so at night sometimes occurring around 6-7pm or waking him up out of his sleep, also sometimes occurring in the morning. Please advise.

## 2020-05-30 NOTE — Telephone Encounter (Signed)
Every so often wakes in mid night feeling heart beating hard.  Check pulse a couple of the times and HR was in 80s.  A few occasions w cold sweat at same time.  No SOB.  Not aware of having sleep apnea symptoms. Wife does not note consistent snoring.   Dr. [name] was thinking anxiety.   Does not have trouble falling asleep.  Has trouble staying asleep.  Feels this hard heart beat when relaxing in evenings as well  Not racing.    States he seems to go through this every few years.  Last ov with Dr. [name] 09/2017.  Had EKG at PCP.  Does not wish to have virtual. Initially did not wish to see PA but will after speaking w me.  Will do what Dr. [name] recommends.  He was prescribed propranolol at last ov for same symptoms.  Never took any of this.  Would be willing to try.  Aware I am forwarding to Dr. [name] and will call him with recommendations.

## 2020-05-31 MED ORDER — METOPROLOL TARTRATE 25 MG PO TABS: 25.0000 mg | ORAL_TABLET | Freq: Every evening | ORAL | 3 refills | Status: DC

## 2020-05-31 NOTE — Telephone Encounter (Signed)
Spoke to patient regarding new medication order from Dr. [name]. Patient verbalized understanding. Medication sent to pharmacy. Encouraged patient to contact the office with any questions or concerns.

## 2020-06-07 ENCOUNTER — Telehealth: Payer: Self-pay | Admitting: Cardiovascular Disease

## 2020-06-07 NOTE — Telephone Encounter (Unsigned)
Pt c/o medication issue:  1. Name of Medication: ***  2. How are you currently taking this medication (dosage and times per day)?metoprolol  3. Are you having a reaction (difficulty breathing--STAT)? no 4. What is your medication issue? Very fatigued, not sleeping well

## 2020-06-12 ENCOUNTER — Encounter: Payer: 59 | Admitting: Gastroenterology

## 2020-06-12 ENCOUNTER — Telehealth: Payer: Self-pay | Admitting: Cardiovascular Disease

## 2020-06-12 NOTE — Telephone Encounter (Signed)
Pt c/o medication issue:  1. Name of Medication:  metoprolol tartrate (LOPRESSOR) 25 MG tablet  2. How are you currently taking this medication (dosage and times per day)? As written  3. Are you having a reaction (difficulty breathing--STAT)? Yes   4. What is your medication issue?  Extreme Fatigue

## 2020-06-12 NOTE — Telephone Encounter (Signed)
Spoke with the patient who states that he has not been abel to tolerate the Lopressor as it is causing him fatigue. He states that he took it for about a week and was taking it at night as it was prescribed. He states that it did help with his palpitations, however in the afternoons he was becoming extremely fatigued. He said that he is no longer taking it and fatigue has improved. Patient is requesting an office visit. He is aware Dr. [name] would not be able to see him until July and an APP would not be available until June. Offered patient a virtual visit with Dr. [name] on 05/06 which he accepted.

## 2020-06-14 NOTE — Progress Notes (Signed)
CARDIOLOGY CONSULT NOTE     Virtual Visit via Video Note   This visit type was conducted due to national recommendations for restrictions regarding the COVID-19 Pandemic (e.g. social distancing) in an effort to limit this patient's exposure and mitigate transmission in our community.  Due to her co-morbid illnesses, this patient is at least at moderate risk for complications without adequate follow up.  This format is felt to be most appropriate for this patient at this time.  All issues noted in this document were discussed and addressed.  A limited physical exam was performed with this format.  Please refer to the patient's chart for her consent to telehealth for [facility].   Patient location Home Physician location : Office   Patient ID: Jay Combs MRN: [id number] DOB/AGE: [birth year] 65 y.o.  Admit date: (Not on file) Referring Physician: Yong Combs  Primary Physician: [name], MD Primary Cardiologist: [name]/  Reason for Consultation: Palpitations  HPI:  65 y.o. last seen by cardiology August 2019. Was seen in 2015 for benign palpitations likely related to job stress and anxiety. Had normal myouve in 2015 And echo EF 50-55% no valve disease. Rx PRN Inderal. Called office more recently with nightly palpitations Pulse 80's and feels "hard" beats. Symptoms date  back at least 5-6 months now that he thinks further on this. Feels a hard heart beat in his chest. Will wake up in the morning and note it. Gets a sensation of tightness in upper chest- taking deep breaths can help slightly. Episodes can last a few minutes up to a half hour. Never happens when he is exercising- exercises 4 days a week- did 3 miles elliptical with high incline today and had HR to 168 and felt no issues. No shortness of breath with episodes.   Rx with lopressor 25 qhs for about a week and made him feel fatigued so he stopped  Labs 05/18/20 normal TSH 1.7 , Hct 41 with normal Cr/K/LFTls ECG normal 05/18/20  Sinus rate 75 no arrhythmia no pre excitation   Noted worse memory lately Declined neuro cognitive testing   Does elliptical for 3 miles /day no issues No issues when exercising or busy at work  Going to [place] as part of a board member meeting Does This 3x/year One son in [place] and one in [place] other systems reviewed and negative except as noted above  Past Medical History:  Diagnosis Date  . ALLERGIC RHINITIS 09/25/2006  . BRADYCARDIA 09/25/2006   athletic Heart rate  . CHEST PAIN UNSPECIFIED 10/28/2007   cardiology evaluation including stress test reassuring  . INSOMNIA, CHRONIC 08/09/2008  . PALPITATIONS, CHRONIC 10/28/2007    Family History  Problem Relation Age of Onset  . Aortic aneurysm Mother        never smoker  . Breast cancer Mother        stage I- mastectomy  . Cervical cancer Mother        ? uterine.   . Lung cancer Father        smoker. chemo, radiation. surgery not available- spots shrunk some. depressed mood  . Pulmonary fibrosis Father        passed at 22  . Scoliosis Sister        also some head trauma after abuse from spouse  . Other Son        X-linked gammaglobulinemia. monthly infusions  . Healthy Son   . Other Maternal Grandmother  unilateral kidney but lived to 72  . Heart attack Paternal Grandfather        younger than 59- but patients dad has not had issues  . Colon cancer Neg Hx   . Colon polyps Neg Hx   . Esophageal cancer Neg Hx   . Rectal cancer Neg Hx   . Stomach cancer Neg Hx     Social History   Socioeconomic History  . Marital status: Married    Spouse name: Not on file  . Number of children: 2  . Years of education: Not on file  . Highest education level: Not on file  Occupational History  . Occupation: Games developer: [employer]  Tobacco Use  . Smoking status: Never Smoker  . Smokeless tobacco: Never Used  Vaping Use  . Vaping Use: Never used  Substance and Sexual Activity  .  Alcohol use: Yes    Alcohol/week: 7.0 standard drinks    Types: 7 Standard drinks or equivalent per week  . Drug use: No  . Sexual activity: Not on file  Other Topics Concern  . Not on file  Social History Narrative   Married. 2 sons 49 and 41 in 2018. No grandkids yet. Has some grandcats.         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 COLONOSCOPY  11/09/2008   Stark-normal  . COLONOSCOPY  2022   Stark-normal GERD  . NASAL SEPTUM SURGERY        Current Outpatient Medications:  .  Cholecalciferol (VITAMIN D3) 25 MCG (1000 UT) CAPS, Take 1 capsule by mouth daily., Disp: , Rfl:  .  Cyanocobalamin (VITAMIN B-12 PO), Take 1 tablet by mouth once a week., Disp: , Rfl:  .  metoprolol tartrate (LOPRESSOR) 25 MG tablet, Take 1 tablet (25 mg total) by mouth at bedtime. (Patient not taking: Reported on 06/12/2020), Disp: 90 tablet, Rfl: 3 .  Misc Natural Products (TART CHERRY ADVANCED PO), Take 1 tablet by mouth daily., Disp: , Rfl:  .  Omega-3 Fatty Acids (FISH OIL PO), Take 2 capsules by mouth daily in the afternoon., Disp: , Rfl:     Physical Exam: There were no vitals taken for this visit.    Video  No distress No JVP elevation No tachypnea  Labs:   Lab Results  Component Value Date   WBC 6.7 05/18/2020   HGB 13.7 05/18/2020   HCT 41.0 05/18/2020   MCV 91.1 05/18/2020   PLT 216.0 05/18/2020   No results for input(s): NA, K, CL, CO2, BUN, CREATININE, CALCIUM, PROT, BILITOT, ALKPHOS, ALT, AST, GLUCOSE  in the last 168 hours.  Invalid input(s): LABALBU No results found for: CKTOTAL, CKMB, CKMBINDEX, TROPONINI  Lab Results  Component Value Date   CHOL 217 (H) 04/06/2019   CHOL 215 (A) 05/05/2018   CHOL 213 (H) 01/12/2017   Lab Results  Component Value Date   HDL 73.20 04/06/2019  HDL 73 (A) 05/05/2018   HDL 64.20 01/12/2017   Lab Results  Component Value Date   LDLCALC 132 (H) 04/06/2019   LDLCALC 124 05/05/2018   LDLCALC 137 (H) 01/12/2017   Lab Results  Component Value Date   TRIG 58.0 04/06/2019   TRIG 86 05/05/2018   TRIG 57.0 01/12/2017   Lab Results  Component Value Date   CHOLHDL 3 04/06/2019   CHOLHDL 3 01/12/2017   CHOLHDL 3 10/08/2015   Lab Results  Component Value Date   LDLDIRECT 117.7 11/19/2009      Radiology: No results found.  EKG: see HPI   ASSESSMENT AND PLAN:   1. Palpitations : benign sounding History of same 2015 Fatigue with low dose lopressor taken at night Labs and baseline ECG ok will do 14 day event monitor and assess from there   2. Memory/Cognitive:  F/u primary declined testing    Time: spent reviewing records from 2015 and 2019 labs, ECG, echo 2015 And primary care notes. Direct patient interview and composing note 30 minutes   F/U PRN pending results   Signed: Jenkins Rouge 06/14/2020, 7:10 PM

## 2020-06-15 ENCOUNTER — Telehealth: Payer: Self-pay | Admitting: *Deleted

## 2020-06-15 ENCOUNTER — Other Ambulatory Visit: Payer: Self-pay

## 2020-06-15 ENCOUNTER — Telehealth (INDEPENDENT_AMBULATORY_CARE_PROVIDER_SITE_OTHER): Payer: 59 | Admitting: Cardiovascular Disease

## 2020-06-15 VITALS — HR 92 | Ht 66.0 in | Wt 140.0 lb

## 2020-06-15 DIAGNOSIS — R002 Palpitations: Secondary | ICD-10-CM | POA: Diagnosis not present

## 2020-06-15 DIAGNOSIS — R4189 Other symptoms and signs involving cognitive functions and awareness: Secondary | ICD-10-CM

## 2020-06-15 NOTE — Addendum Note (Signed)
Addended by: Devra Dopp E on: 06/15/2020 08:34 AM   Modules accepted: Orders

## 2020-06-15 NOTE — Patient Instructions (Signed)
Medication Instructions:  No changes *If you need a refill on your cardiac medications before your next appointment, please call your pharmacy*   Lab Work [place]chet Monitor Instructions   Your physician has requested you wear a ZIO patch monitor for ___ days.  This is a single patch monitor.   [employer] supplies one patch monitor per enrollment. Additional stickers are not available. Please do not apply patch if you will be having a Nuclear Stress Test, Echocardiogram, Cardiac CT, MRI, or Chest Xray during the period you would be wearing the monitor. The patch cannot be worn during these tests. You cannot remove and re-apply the ZIO XT patch monitor.  Your ZIO patch monitor will be sent Fed Ex from [employer] directly to your home address. It may take 3-5 days to receive your monitor after you have been enrolled.  Once you have received your monitor, please review the enclosed instructions. Your monitor has already been registered assigning a specific monitor serial # to you.  Billing and Patient Assistance Program Information   We have supplied [employer] with any of your insurance information on file for billing purposes. [employer] offers a sliding scale Patient Assistance Program for patients that do not have insurance, or whose insurance does not completely cover the cost of the ZIO monitor.   You must apply for the Patient Assistance Program to qualify for this discounted rate.     To apply, please call [employer] at [phone], select option 4, then select option 2, and ask to apply for Patient Assistance Program.  Theodore Demark will ask your household income, and how many people are in your household.  They will quote your out-of-pocket cost based on that information.  [employer] will also be able to set up a 18-month, interest-free payment plan if needed.  Applying the monitor   Shave hair from upper left chest.  Hold abrader disc by orange tab. Rub abrader in 40 strokes over the upper  left chest as indicated in your monitor instructions.  Clean area with 4 enclosed alcohol pads. Let dry.  Apply patch as indicated in monitor instructions. Patch will be placed under collarbone on left side of chest with arrow pointing upward.  Rub patch adhesive wings for 2 minutes. Remove white label marked "1". Remove the white label marked "2". Rub patch adhesive wings for 2 additional minutes.  While looking in a mirror, press and release button in center of patch. A small green light will flash 3-4 times. This will be your only indicator that the monitor has been turned on. ?  Do not shower for the first 24 hours. You may shower after the first 24 hours.  Press the button if you feel a symptom. You will hear a small click. Record Date, Time and Symptom in the Patient Logbook.  When you are ready to remove the patch, follow instructions on the last 2 pages of the Patient Logbook. Stick patch monitor onto the last page of Patient Logbook.  Place Patient Logbook in the blue and white box.  Use locking tab on box and tape box closed securely.  The blue and white box has prepaid postage on it. Please place it in the mailbox as soon as possible. Your physician should have your test results approximately 7 days after the monitor has been mailed back to [facility].  Call [place] at [phone] if you have questions regarding your ZIO XT patch monitor. Call them immediately if you see an orange  light blinking on your monitor.  If your monitor falls off in less than 4 days, contact our Monitor department at [phone]. ?If your monitor becomes loose or falls off after 4 days call [employer] at [phone] for suggestions on securing your monitor.?  If you have labs (blood work) drawn today and your tests are completely normal, you will receive your results only by: Marland Kitchen MyChart Message (if you have MyChart) OR . A paper copy in the mail If you have any lab test that is abnormal  or we need to change your treatment, we will call you to review the results.   Testing/Procedures:   Follow-Up: At [facility], you and your health needs are our priority.  As part of our continuing mission to provide you with exceptional heart care, we have created designated Provider Care Teams.  These Care Teams include your primary Cardiologist (physician) and Advanced Practice Providers (APPs -  Physician Assistants and Nurse Practitioners) who all work together to provide you with the care you need, when you need it.  We recommend signing up for the patient portal called "MyChart".  Sign up information is provided on this After Visit Summary.  MyChart is used to connect with patients for Virtual Visits (Telemedicine).  Patients are able to view lab/test results, encounter notes, upcoming appointments, etc.  Non-urgent messages can be sent to your provider as well.   To learn more about what you can do with MyChart, go to [email or web address].    Your next appointment: AS NEEDED  The format for your next appointment:     Provider:     Other Instructions

## 2020-06-15 NOTE — Telephone Encounter (Signed)
Patient enrolled for [employer] to ship a 14 day long term holter to his home, target delivery date 06/29/20.  Explained to patient  [employer]/ Zio not in network with [facility].  The [employer] holter is the same test/ service.  They are in network with [facility].  Instructions will be different than instructions he was provided.  They will be included in his monitor kit.

## 2020-06-18 ENCOUNTER — Ambulatory Visit (INDEPENDENT_AMBULATORY_CARE_PROVIDER_SITE_OTHER): Payer: 59

## 2020-06-18 DIAGNOSIS — R002 Palpitations: Secondary | ICD-10-CM

## 2020-07-01 DIAGNOSIS — R002 Palpitations: Secondary | ICD-10-CM

## 2020-07-05 ENCOUNTER — Ambulatory Visit (INDEPENDENT_AMBULATORY_CARE_PROVIDER_SITE_OTHER): Payer: 59 | Admitting: Family Medicine

## 2020-07-05 ENCOUNTER — Encounter: Payer: Self-pay | Admitting: Family Medicine

## 2020-07-05 ENCOUNTER — Other Ambulatory Visit: Payer: Self-pay

## 2020-07-05 VITALS — BP 120/76 | HR 64 | Temp 98.2°F | Ht 66.0 in | Wt 141.2 lb

## 2020-07-05 DIAGNOSIS — E785 Hyperlipidemia, unspecified: Secondary | ICD-10-CM | POA: Diagnosis not present

## 2020-07-05 DIAGNOSIS — E559 Vitamin D deficiency, unspecified: Secondary | ICD-10-CM

## 2020-07-05 DIAGNOSIS — Z125 Encounter for screening for malignant neoplasm of prostate: Secondary | ICD-10-CM

## 2020-07-05 DIAGNOSIS — Z0000 Encounter for general adult medical examination without abnormal findings: Secondary | ICD-10-CM

## 2020-07-05 DIAGNOSIS — H35373 Puckering of macula, bilateral: Secondary | ICD-10-CM

## 2020-07-05 LAB — POC URINALSYSI DIPSTICK (AUTOMATED)
Bilirubin, UA: NEGATIVE
Blood, UA: NEGATIVE
Glucose, UA: NEGATIVE
Ketones, UA: NEGATIVE
Leukocytes, UA: NEGATIVE
Nitrite, UA: NEGATIVE
Protein, UA: NEGATIVE
Spec Grav, UA: 1.01 (ref 1.010–1.025)
Urobilinogen, UA: 0.2 E.U./dL
pH, UA: 6.5 (ref 5.0–8.0)

## 2020-07-05 LAB — LIPID PANEL
Cholesterol: 213 mg/dL — ABNORMAL HIGH (ref 0–200)
HDL: 72.2 mg/dL (ref >39.00)
LDL Cholesterol: 130 mg/dL — ABNORMAL HIGH (ref 0–99)
NonHDL: 141.28
Total CHOL/HDL Ratio: 3
Triglycerides: 55 mg/dL (ref 0.0–149.0)
VLDL: 11 mg/dL (ref 0.0–40.0)

## 2020-07-05 LAB — VITAMIN D 25 HYDROXY (VIT D DEFICIENCY, FRACTURES): VITD: 31.62 ng/mL (ref 30.00–100.00)

## 2020-07-05 LAB — PSA: PSA: 1.31 ng/mL (ref 0.10–4.00)

## 2020-07-05 NOTE — Progress Notes (Signed)
Phone: [phone]   Subjective:  Patient presents today for their annual physical. Chief complaint-noted.   See problem oriented charting- ROS- full  review of systems was completed and negative  except for: dental problems, palpitation/irregular heart beats, constipation, and back pain-chronic issue for which he sees a masseuse and finds helpful-no recent worsening.    The following were reviewed and entered/updated in epic: Past Medical History:  Diagnosis Date  . ALLERGIC RHINITIS 09/25/2006  . BRADYCARDIA 09/25/2006   athletic Heart rate  . CHEST PAIN UNSPECIFIED 10/28/2007   cardiology evaluation including stress test reassuring  . INSOMNIA, CHRONIC 08/09/2008  . PALPITATIONS, CHRONIC 10/28/2007   Patient Active Problem List   Diagnosis Date Noted  . Lumbar radiculopathy 12/15/2018    Priority: Medium  . Anxiety 08/12/2017    Priority: Medium  . GERD (gastroesophageal reflux disease) 01/12/2017    Priority: Medium  . Hyperlipidemia 01/12/2017    Priority: Medium  . Ptosis, right 01/12/2017    Priority: Medium  . PALPITATIONS, CHRONIC 10/28/2007    Priority: Medium  . Left leg pain 10/15/2018    Priority: Low  . Macular pucker of both eyes 01/12/2017    Priority: Low  . Sensation of fullness in both ears 10/26/2019  . Closed disp fracture of left lateral malleolus with delayed healing 10/15/2018   Past Surgical History:  Procedure Laterality Date  . COLONOSCOPY  11/09/2008   Stark-normal  . COLONOSCOPY  2022   Stark-normal GERD  . NASAL SEPTUM SURGERY      Family History  Problem Relation Age of Onset  . Aortic aneurysm Mother        never smoker  . Breast cancer Mother        stage I- mastectomy  . Cervical cancer Mother        ? uterine.   . Lung cancer Father        smoker. chemo, radiation. surgery not available- spots shrunk some. depressed mood  . Pulmonary fibrosis Father        passed at 61  . Scoliosis Sister        also some head trauma  after abuse from spouse  . Other Son        X-linked gammaglobulinemia. monthly infusions  . Healthy Son   . Other Maternal Grandmother        unilateral kidney but lived to 78  . Heart attack Paternal Grandfather        younger than 3- but patients dad has not had issues  . Colon cancer Neg Hx   . Colon polyps Neg Hx   . Esophageal cancer Neg Hx   . Rectal cancer Neg Hx   . Stomach cancer Neg Hx     Medications- reviewed and updated Current Outpatient Medications  Medication Sig Dispense Refill  . Cholecalciferol (VITAMIN D3) 25 MCG (1000 UT) CAPS Take 1 capsule by mouth daily.    . Cyanocobalamin (VITAMIN B-12 PO) Take 1 tablet by mouth once a week.    . Magnesium 100 MG TABS Take by mouth. Before bed- patient finds relaxing    . Misc Natural Products (TART CHERRY ADVANCED PO) Take 1 tablet by mouth daily.    . Omega-3 Fatty Acids (FISH OIL PO) Take 2 capsules by mouth daily in the afternoon.     No current facility-administered medications for this visit.    Allergies-reviewed and updated Allergies  Allergen Reactions  . Demerol [Meperidine] Other (See Comments)    Decreased  BP    Social History   Social History Narrative   Married. 2 sons 70 and 58 in 2018. No grandkids yet. Has some grandcats.         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 Oropharynx normal, nasal turbinates normal, TMs normal Neck: no thyromegaly, no carotid bruits, no cervical lymphadenopathy CV: RRR no murmurs rubs or gallops Lungs: CTAB no crackles, wheeze, rhonchi Abdomen: soft/nontender/nondistended/normal  bowel sounds. No rebound or guarding.  Ext: no edema.  Likely ganglion cyst on right wrist. 2+ PT pulses. Skin: warm, dry, on left lateral forehead 1 to 2 x 1 to 2 mm raised soft lesion-possible lipoma or cyst-encouraged him to discuss with dermatology next visit but has been stable for years Neuro: grossly normal, moves all extremities, PERRLA    Assessment and Plan  65 y.o. male presenting for annual physical.  Health Maintenance counseling: 1. Anticipatory guidance: Patient counseled regarding regular dental exams -q6 months, eye exams yearly,  avoiding smoking and second hand smoke , limiting alcohol to 2 beverages per day- over course of weekend.   2. Risk factor reduction:  Advised patient of need for regular exercise and diet rich and fruits and vegetables to reduce risk of heart attack and stroke. Exercise- Continues to go to [facility] 4-5 days per week. Diet- Does not intake fast food or sodas. Tries to eat reasonably healthy- avoids processed foods, all organic foods.  Wt Readings from Last 3 Encounters:  07/05/20 141 lb 3.2 oz (64 kg)  06/15/20 140 lb (63.5 kg)  05/29/20 140 lb (63.5 kg)  3. Immunizations/screenings/ancillary studies- fully up-to-date other than COVID-19 vaccination #4-- had a mild case of COVID in March 2022- we discussed waiting at least until the fall for vaccination.  Also wonder if COVID could have affected memory-see below Immunization History  Administered Date(s) Administered  . Influenza Inj Mdck Quad Pf 11/14/2018  . Influenza Split 12/09/2010  . Influenza Whole 02/10/2005, 12/08/2006, 10/28/2007, 11/24/2008, 11/19/2009  . Influenza,inj,Quad PF,6+ Mos 12/05/2013, 12/08/2014, 10/16/2015, 12/01/2016  . Influenza-Unspecified 11/14/2018, 11/22/2019  . PFIZER(Purple Top)SARS-COV-2 Vaccination 04/11/2019, 05/11/2019, 02/18/2020  . Td 02/10/2005  . Tdap 10/16/2015  . Zoster 10/06/2012  . Zoster Recombinat (Shingrix) 06/15/2017, 08/31/2017  4. Prostate cancer  screening- low risk for PSA trend-- had PSA with blood work at work. Lab Results  Component Value Date   PSA 1.98 04/06/2019   PSA 1.47 05/05/2018   PSA 1.47 01/12/2017   5. Colon cancer screening - #History of adenomatous polyp of the colon-April 20, 2020 with 7-year repeat with Dr. Fuller Plan 6. Skin cancer screening- followed by dermatology and advised regular sunscreen use. Denies worrisome, changing, or new skin lesions. Goes once a year 7. Never smoker 8. STD screening - none.  Status of chronic or acute concerns   #Social update-patient lost father August 2021- continues grieving process. His mother is doing better and plans to visit his niece soon in [place]. He plans to go to [place] in June  for his sister-in-law's wedding anniversary.  #Memory loss S: Patient has been concerned about memory loss-has had a lot of stress but has had some increased forgetfulness as well.  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  He has denied depressed symptoms. Mini cog's have been normal September 28, 2018 and normal again May 18, 2020 with 2 out of 3 delayed recall and normal clock draw. B12, TSH, CBC, CMP normal. -Has declined need for STD testing -He has declined neurocognitive testing with [facility] in the past A/P: Patient reports overall stable-no recent worsening-he wants to continue to monitor without further investigation or intervening  #hyperlipidemia S: Medication:omega 3 fatty acids. 10-year ASCVD risk 7.6% when most recently calculated. A/P: We ordered coronary calcium scoring at last visit but unfortunately this has not yet been set up-I asked my team to investigate today  #Low normal B12- have recommended patient take B12 supplements once a week keep levels over 400-he would like to increase to twice a day-that certainly reasonable  #Dental Decay-dentist has thought he could  potentially have acid reflux or sleep apnea. Endoscopy scheduled on 07/23/2020 with Dr. Denton Brick.   #Heart racing/palpitations/nonexertional chest pain- at last visit we offered referral to cardiology but patient is seen them in the past with Dr. [name] and wants to hold off. He did agree to coronary calcium scoring- never heard about his coronary calcium score. He is not currently on a beta blocker-this was started by Dr. [name] sometime after our visit when patient reached out to him the patient was feeling rather fatigued - heart monitor was started instead-patient is going to follow-up with cardiology  #Vitamin D deficiency- as low as 17 in 2020 S: Medication: 1000 units per day A/P: We have not recently checked this-we will update with labs today  Recommended follow up: Return in about 1 year (around 07/05/2021) for physical or sooner if needed. Future Appointments  Date Time Provider [facility]  07/23/2020  9:30 AM BRAVO RECORDER 1 [facility]  07/23/2020 10:00 AM Nandigam, Venia Minks, MD [facility]   Lab/Order associations: fasting   ICD-10-CM   1. Preventative health care  Z00.00 Lipid panel    PSA    VITAMIN D 25 Hydroxy (Vit-D Deficiency, Fractures)    POCT Urinalysis Dipstick (Automated)    POCT Urinalysis Dipstick (Automated)  2. Hyperlipidemia, unspecified hyperlipidemia type  E78.5 Lipid panel    POCT Urinalysis Dipstick (Automated)    POCT Urinalysis Dipstick (Automated)  3. Screening for prostate cancer  Z12.5 PSA  4. Vitamin D deficiency  E55.9 VITAMIN D 25 Hydroxy (Vit-D Deficiency, Fractures)  5. Macular pucker of both eyes  H35.373     No orders of the defined types were placed in this encounter.  I,Harris Phan,acting as a Education administrator for Garret Reddish, MD.,have documented all relevant documentation on the behalf of Garret Reddish, MD,as directed by  Garret Reddish, MD while in the presence of Garret Reddish, MD.   I, Garret Reddish, MD, have reviewed  all documentation for this visit. The documentation on 07/05/20 for the exam, diagnosis, procedures, and orders are all accurate and complete.   Return precautions advised.  Garret Reddish, MD

## 2020-07-05 NOTE — Patient Instructions (Addendum)
Team he never heard about his coronary calcium scoring- can you give him an update?  Please stop by lab before you go If you have mychart- we will send your results within 3 business days of [place] receiving them.  If you do not have mychart- we will call you about results within 5 business days of [place] receiving them.  *please also note that you will see labs on mychart as soon as they post. I will later go in and write notes on them- will say "notes from Dr. [name]"   Recommended follow up: Return in about 1 year (around 07/05/2021) for physical or sooner if needed.

## 2020-07-12 ENCOUNTER — Encounter: Payer: Self-pay | Admitting: Family Medicine

## 2020-07-13 NOTE — Telephone Encounter (Signed)
Jay Combs, can you look into this. Looks like dr. [name] ordered test on 4/8 CT Cardiac Scoring ( Self Pay Only). Pt has not been scheduled yet.

## 2020-07-18 NOTE — Telephone Encounter (Signed)
Noted  

## 2020-07-23 ENCOUNTER — Other Ambulatory Visit: Payer: Self-pay

## 2020-07-23 ENCOUNTER — Ambulatory Visit (AMBULATORY_SURGERY_CENTER): Payer: 59 | Admitting: Gastroenterology

## 2020-07-23 ENCOUNTER — Encounter: Payer: Self-pay | Admitting: Gastroenterology

## 2020-07-23 VITALS — BP 107/72 | HR 59 | Temp 97.3°F | Resp 23 | Ht 66.0 in | Wt 140.0 lb

## 2020-07-23 DIAGNOSIS — K2100 Gastro-esophageal reflux disease with esophagitis, without bleeding: Secondary | ICD-10-CM

## 2020-07-23 DIAGNOSIS — K2970 Gastritis, unspecified, without bleeding: Secondary | ICD-10-CM

## 2020-07-23 DIAGNOSIS — K319 Disease of stomach and duodenum, unspecified: Secondary | ICD-10-CM | POA: Diagnosis not present

## 2020-07-23 DIAGNOSIS — K255 Chronic or unspecified gastric ulcer with perforation: Secondary | ICD-10-CM

## 2020-07-23 DIAGNOSIS — K219 Gastro-esophageal reflux disease without esophagitis: Secondary | ICD-10-CM

## 2020-07-23 MED ORDER — SODIUM CHLORIDE 0.9 % IV SOLN: 500.0000 mL | Freq: Once | INTRAVENOUS | Status: DC

## 2020-07-23 MED ORDER — PANTOPRAZOLE SODIUM 40 MG PO TBEC
40.0000 mg | DELAYED_RELEASE_TABLET | Freq: Every day | ORAL | 0 refills | Status: DC
Start: 2020-07-23 — End: 2020-07-30

## 2020-07-23 NOTE — Progress Notes (Signed)
RX was sent to [facility] at [facility]    No problems noted in the recovery room. maw

## 2020-07-23 NOTE — Progress Notes (Signed)
Pt's states no medical or surgical changes since previsit or office visit.  Reviewed BRAVO recorder teaching on admission.

## 2020-07-23 NOTE — Op Note (Signed)
[facility] Patient Name: Jay Combs Procedure Date: 07/23/2020 9:47 AM MRN: [id number] Endoscopist: Mauri Pole , MD Age: 65 Referring MD:  Date of Birth: [birth year] Gender: Male Account #: [id number] Procedure:                Upper GI endoscopy Indications:              Suspected esophageal reflux, Suspected                            gastro-esophageal reflux disease, Exclusion of                            gastro-esophageal reflux disease, 48 hr pH Bravo                            placement. Medicines:                Monitored Anesthesia Care Procedure:                Pre-Anesthesia Assessment:                           - Prior to the procedure, a History and Physical                            was performed, and patient medications and                            allergies were reviewed. The patient's tolerance of                            previous anesthesia was also reviewed. The risks                            and benefits of the procedure and the sedation                            options and risks were discussed with the patient.                            All questions were answered, and informed consent                            was obtained. Prior Anticoagulants: The patient has                            taken no previous anticoagulant or antiplatelet                            agents. ASA Grade Assessment: II - A patient with                            mild systemic disease. After reviewing the risks  and benefits, the patient was deemed in                            satisfactory condition to undergo the procedure.                           After obtaining informed consent, the endoscope was                            passed under direct vision. Throughout the                            procedure, the patient's blood pressure, pulse, and                            oxygen saturations were monitored continuously. The                             Endoscope was introduced through the mouth, and                            advanced to the second part of duodenum. The upper                            GI endoscopy was accomplished without difficulty.                            The patient tolerated the procedure well. Scope In: Scope Out: Findings:                 LA Grade B (one or more mucosal breaks greater than                            5 mm, not extending between the tops of two mucosal                            folds) esophagitis with no bleeding was found 37 to                            39 cm from the incisors.                           The Z-line was regular and was found 38 cm from the                            incisors.                           The gastroesophageal flap valve was visualized                            endoscopically and classified as Hill Grade II                            (  fold present, opens with respiration).                           Patchy mild inflammation characterized by erosions                            and erythema was found in the gastric antrum and in                            the prepyloric region of the stomach. Biopsies were                            taken with a cold forceps for histology. Biopsies                            were taken with a cold forceps for Helicobacter                            pylori testing.                           The cardia and gastric fundus were normal on                            retroflexion.                           The examined duodenum was normal. Complications:            No immediate complications. Estimated Blood Loss:     Estimated blood loss was minimal. Impression:               - LA Grade B reflux and erosive esophagitis with no                            bleeding. Decision was made to not proceed with                            placement of 48 hr pH Bravo sensor.                           - Z-line regular, 38 cm from  the incisors.                           - Gastroesophageal flap valve classified as Hill                            Grade II (fold present, opens with respiration).                           - Gastritis. Biopsied.                           - Normal examined duodenum. Recommendation:           - Patient has a contact number  available for                            emergencies. The signs and symptoms of potential                            delayed complications were discussed with the                            patient. Return to normal activities tomorrow.                            Written discharge instructions were provided to the                            patient.                           - Resume previous diet.                           - Continue present medications.                           - Follow an antireflux regimen.                           - Use Protonix (pantoprazole) 40 mg PO daily for 3                            months.                           - Return to GI office for follow up visit with Dr                            Fuller Plan at appointment to be scheduled in 2 months. Mauri Pole, MD 07/23/2020 10:17:10 AM This report has been signed electronically.

## 2020-07-23 NOTE — Patient Instructions (Addendum)
Handouts were given to your care partner on esophagitis, GERD and gastritis. A prescription was sent to your pharmacy for PANTOPRAZOLE 40 mg.  Take daily on an empty stomach 20-30 minutes before breakfast for 3 months. You may resume your other current medications today. Await biopsy results.  May take 1-3 weeks to receive pathology results. The GI Office will call you to schedule an appointment for 2 months with Dr. Fuller Plan. Please call if any questions or concerns.      YOU HAD AN ENDOSCOPIC PROCEDURE TODAY AT [facility]:   Refer to the procedure report that was given to you for any specific questions about what was found during the examination.  If the procedure report does not answer your questions, please call your gastroenterologist to clarify.  If you requested that your care partner not be given the details of your procedure findings, then the procedure report has been included in a sealed envelope for you to review at your convenience later.  YOU SHOULD EXPECT: Some feelings of bloating in the abdomen. Passage of more gas than usual.  Walking can help get rid of the air that was put into your GI tract during the procedure and reduce the bloating. If you had a lower endoscopy (such as a colonoscopy or flexible sigmoidoscopy) you may notice spotting of blood in your stool or on the toilet paper. If you underwent a bowel prep for your procedure, you may not have a normal bowel movement for a few days.  Please Note:  You might notice some irritation and congestion in your nose or some drainage.  This is from the oxygen used during your procedure.  There is no need for concern and it should clear up in a day or so.  SYMPTOMS TO REPORT IMMEDIATELY:  Following upper endoscopy (EGD)  Vomiting of blood or coffee ground material  New chest pain or pain under the shoulder blades  Painful or persistently difficult swallowing  New shortness of breath  Fever of 100F or  higher  Black, tarry-looking stools  For urgent or emergent issues, a gastroenterologist can be reached at any hour by calling [phone]. Do not use MyChart messaging for urgent concerns.    DIET:  We do recommend a small meal at first, but then you may proceed to your regular diet.  Drink plenty of fluids but you should avoid alcoholic beverages for 24 hours.  ACTIVITY:  You should plan to take it easy for the rest of today and you should NOT DRIVE or use heavy machinery until tomorrow (because of the sedation medicines used during the test).    FOLLOW UP: Our staff will call the number listed on your records 48-72 hours following your procedure to check on you and address any questions or concerns that you may have regarding the information given to you following your procedure. If we do not reach you, we will leave a message.  We will attempt to reach you two times.  During this call, we will ask if you have developed any symptoms of COVID 19. If you develop any symptoms (ie: fever, flu-like symptoms, shortness of breath, cough etc.) before then, please call [phone].  If you test positive for Covid 19 in the 2 weeks post procedure, please call and report this information to [place].    If any biopsies were taken you will be contacted by phone or by letter within the next 1-3 weeks.  Please call us at [phone] if  you have not heard about the biopsies in 3 weeks.    SIGNATURES/CONFIDENTIALITY: You and/or your care partner have signed paperwork which will be entered into your electronic medical record.  These signatures attest to the fact that that the information above on your After Visit Summary has been reviewed and is understood.  Full responsibility of the confidentiality of this discharge information lies with you and/or your care-partner.

## 2020-07-23 NOTE — Progress Notes (Signed)
To PACU, VSS. Report to [email or web address] 

## 2020-07-23 NOTE — Progress Notes (Signed)
Called to room to assist during endoscopic procedure.  Patient ID and intended procedure confirmed with present staff. Received instructions for my participation in the procedure from the performing physician.  

## 2020-07-25 ENCOUNTER — Telehealth: Payer: Self-pay

## 2020-07-25 NOTE — Telephone Encounter (Signed)
  Follow up Call-  Call back number 07/23/2020 04/20/2020  Post procedure Call Back phone  # [phone]  Permission to leave phone message Yes Yes  Some recent data might be hidden     Patient questions:  Do you have a fever, pain , or abdominal swelling? No. Pain Score  0 *  Have you tolerated food without any problems? Yes.    Have you been able to return to your normal activities? Yes.    Do you have any questions about your discharge instructions: Diet   No. Medications  No. Follow up visit  No.  Do you have questions or concerns about your Care? No.  Actions: * If pain score is 4 or above: No action needed, pain <4.   Have you developed a fever since your procedure? No   2.   Have you had an respiratory symptoms (SOB or cough) since your procedure? No   3.   Have you tested positive for COVID 19 since your procedure no   4.   Have you had any family members/close contacts diagnosed with the COVID 19 since your procedure?  No    If yes to any of these questions please route to Joylene John, RN and Joella Prince, RN

## 2020-07-26 ENCOUNTER — Other Ambulatory Visit: Payer: Self-pay

## 2020-07-30 MED ORDER — OMEPRAZOLE 40 MG PO CPDR: 40.0000 mg | DELAYED_RELEASE_CAPSULE | Freq: Every day | ORAL | 0 refills | Status: DC

## 2020-07-31 ENCOUNTER — Encounter: Payer: Self-pay | Admitting: Gastroenterology

## 2020-08-20 ENCOUNTER — Encounter: Payer: Self-pay | Admitting: Family Medicine

## 2020-08-20 ENCOUNTER — Ambulatory Visit (INDEPENDENT_AMBULATORY_CARE_PROVIDER_SITE_OTHER)
Admission: RE | Admit: 2020-08-20 | Discharge: 2020-08-20 | Disposition: A | Discharge status: Home / Self Care | Payer: Self-pay | Source: Ambulatory Visit | Attending: Family Medicine | Admitting: Family Medicine

## 2020-08-20 ENCOUNTER — Other Ambulatory Visit: Payer: Self-pay

## 2020-08-20 DIAGNOSIS — E785 Hyperlipidemia, unspecified: Secondary | ICD-10-CM

## 2020-08-20 DIAGNOSIS — I77810 Thoracic aortic ectasia: Secondary | ICD-10-CM | POA: Insufficient documentation

## 2020-08-21 ENCOUNTER — Encounter: Payer: Self-pay | Admitting: Family Medicine

## 2020-10-01 ENCOUNTER — Encounter: Payer: Self-pay | Admitting: Gastroenterology

## 2020-10-01 ENCOUNTER — Ambulatory Visit (INDEPENDENT_AMBULATORY_CARE_PROVIDER_SITE_OTHER): Payer: 59 | Admitting: Gastroenterology

## 2020-10-01 VITALS — BP 100/68 | HR 72 | Ht 64.5 in | Wt 145.5 lb

## 2020-10-01 DIAGNOSIS — K2100 Gastro-esophageal reflux disease with esophagitis, without bleeding: Secondary | ICD-10-CM | POA: Diagnosis not present

## 2020-10-01 DIAGNOSIS — Z86010 Personal history of colonic polyps: Secondary | ICD-10-CM

## 2020-10-01 MED ORDER — OMEPRAZOLE 40 MG PO CPDR: 40.0000 mg | DELAYED_RELEASE_CAPSULE | Freq: Every day | ORAL | 3 refills | Status: DC

## 2020-10-01 NOTE — Progress Notes (Signed)
    History of Present Illness: This is a 65 year old male returning for follow-up of GERD with LA class B erosive esophagitis.  Bravo was planned however with erosive esophagitis found it was not performed.  He remains completely asymptomatic.  EGD 07/2020 - LA Grade B reflux and erosive esophagitis with no bleeding. Decision was made to not proceed with placement of 48 hr pH Bravo sensor. - Z-line regular, 38 cm from the incisors. - Gastroesophageal flap valve classified as Hill Grade II (fold present, opens with respiration). - Gastritis. Biopsied. (nonspecific reactive gastropathy) - Normal examined duodenum.   Current Medications, Allergies, Past Medical History, Past Surgical History, Family History and Social History were reviewed in [employer] record.   Physical Exam: General: Well developed, well nourished, no acute distress Head: Normocephalic and atraumatic Eyes: Sclerae anicteric, EOMI Ears: Normal auditory acuity Mouth: Not examined, mask on during Covid-19 pandemic Lungs: Clear throughout to auscultation Heart: Regular rate and rhythm; no murmurs, rubs or bruits Abdomen: Soft, non tender and non distended. No masses, hepatosplenomegaly or hernias noted. Normal Bowel sounds Rectal: Not done Musculoskeletal: Symmetrical with no gross deformities  Pulses:  Normal pulses noted Extremities: No clubbing, cyanosis, edema or deformities noted Neurological: Alert oriented x 4, grossly nonfocal Psychological:  Alert and cooperative. Normal mood and affect   Assessment and Recommendations:  GERD with LA Class B esophagitis.  Given erosive esophagitis and dental concerns we recommended he follows antireflux measures and remain on a daily PPI long-term.  We discussed the risks and benefits of PPI therapy for the long-term management of GERD.  I addressed his questions to his satisfaction. REV in 1 year.  Personal history of an adenomatous colon polyp.  A  7-year interval surveillance colonoscopy is recommended in March 2029.

## 2020-10-01 NOTE — Patient Instructions (Signed)
We have sent the following medications to your pharmacy for you to pick up at your convenience: omeprazole.  Due to recent changes in healthcare laws, you may see the results of your imaging and laboratory studies on MyChart before your provider has had a chance to review them.  We understand that in some cases there may be results that are confusing or concerning to you. Not all laboratory results come back in the same time frame and the provider may be waiting for multiple results in order to interpret others.  Please give [place] 48 hours in order for your provider to thoroughly review all the results before contacting the office for clarification of your results.   The [facility] providers would like to encourage you to use [facility] to communicate with providers for non-urgent requests or questions.  Due to long hold times on the telephone, sending your provider a message by [facility] may be a faster and more efficient way to get a response.  Please allow 48 business hours for a response.  Please remember that this is for non-urgent requests. ,  Thank you for choosing me and [facility].  [name]. [name]., MD., Marval Regal

## 2020-10-02 ENCOUNTER — Encounter: Payer: Self-pay | Admitting: Family Medicine

## 2020-10-02 NOTE — Progress Notes (Addendum)
Phone [phone] In person visit   Subjective:   Jay Combs is a 65 y.o. year old very pleasant male patient who presents for/with See problem oriented charting Chief Complaint  Patient presents with   Follow-up    Low blood pressure, 100/68 on 8/22 a.m.; feeling fatigue.    This visit occurred during the SARS-CoV-2 public health emergency.  Safety protocols were in place, including screening questions prior to the visit, additional usage of staff PPE, and extensive cleaning of exam room while observing appropriate contact time as indicated for disinfecting solutions.   Past Medical History-  Patient Active Problem List   Diagnosis Date Noted   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[place]) 08/20/2020    Priority: High   Lumbar radiculopathy 12/15/2018    Priority: Medium   Anxiety 08/12/2017    Priority: Medium   GERD (gastroesophageal reflux disease) 01/12/2017    Priority: Medium   Hyperlipidemia 01/12/2017    Priority: Medium   Ptosis, right 01/12/2017    Priority: Medium   PALPITATIONS, CHRONIC 10/28/2007    Priority: Medium   Left leg pain 10/15/2018    Priority: Low   Macular pucker of both eyes 01/12/2017    Priority: Low   Sensation of fullness in both ears 10/26/2019   Closed disp fracture of left lateral malleolus with delayed healing 10/15/2018    Medications- reviewed and updated Current Outpatient Medications  Medication Sig Dispense Refill   Cholecalciferol (VITAMIN D3) 25 MCG (1000 UT) CAPS Take 1 capsule by mouth daily.     Cyanocobalamin (VITAMIN B-12 PO) Take 1 tablet by mouth once a week.     Magnesium 100 MG TABS Take by mouth. Before bed- patient finds relaxing     Misc Natural Products (TART CHERRY ADVANCED PO) Take 1 tablet by mouth daily.     Omega-3 Fatty Acids (FISH OIL PO) Take 2 capsules by mouth daily in the afternoon.     omeprazole (PRILOSEC) 40 MG capsule Take 1 capsule (40 mg total) by mouth daily. Best to take on an empty stomach, take about 30  minutes before breakfast. 90 capsule 3   No current facility-administered medications for this visit.     Objective:  BP 108/72   Pulse 71   Temp 98.1 F (36.7 C)   Wt 140 lb (63.5 kg)   SpO2 98%   BMI 23.66 kg/m  Gen: NAD, resting comfortably CV: RRR no murmurs rubs or gallops Lungs: CTAB no crackles, wheeze, rhonchi Abdomen: soft/nontender/nondistended/normal bowel sounds.  Ext: no edema Skin: warm, dry     Assessment and Plan  # low blood pressure S:on Monday went to see GI and noted low BP at 100/68. Last 2 months has been feeling rather fatigued. Wants to go to bed early and when wakes up does not feel as well rested. Tries to drink a lot of water- woke up Monday and Tuesday 3x. No dysuria or penile discharge.  Some dizziness when standing but that is not atypical for him. No blood in stool. No dark black stool. Fatigue ebs and flows.  - eats healthy and does not have high salt intake- no burger, fries, soda 20 years.  - has had a few nights where  top of pajamas are wet- but house is at [place] reading #s:does not check at home  With fatigue does feel tired but does not feel excessive daytime sleepiness. Not sure if he snores- he will chat with wife.  BP Readings from Last 3 Encounters:  10/04/20 108/72  10/01/20 100/68  07/23/20 107/72  A/P: Patient with low baseline blood pressure over the years often close to 100/70 but usually above this.  He had a particularly low reading of 100/68 at a recent visit which concerned him especially in light of more significant fatigue for him intermittently over the last few months.  We did labs in April but it seems like fatigue is more prominent so we opted to repeat CBC, CMP, TSH.  B12 is low normal so we are going to recheck that as well but he is on a supplement so suspect this will be better.  He wonders if he could be feeling fatigued when blood pressure is running low-this is a possibility and we discussed liberalizing salt  intake and monitoring home blood pressure (he will get a cuff)  He also mentions waking up with swelling around his collar intermittently but he does keep thousand 78 and it is a warm, the year-no drenching night sweats-if this were to progress or become more regular I certainly would want to reevaluate-asked him to let us know.  Omeprazole can be associated with excess sweating so that could be a possible cause.  No unintentional weight loss noted.  I do not think this is consistent with B symptoms   In regards to fatigue his dentist had noticed some dental decay-eventually this was thought to be related to acid reflux but sleep apnea could be a potential cause as well per dentist-he is in a check with his wife to see if he is snoring-consider sleep study  Previously patient had dealt with some palpitations and chest pain-no reports of that today.  No exertional element of fatigue.  Coronary artery calcium score was 0 on August 20, 2020 which is reassuring as well  For polyuria we will check UA  Recommended follow up: keep may visit or sooner if needed for acute concerns Future Appointments  Date Time Provider [place]  07/09/2021  8:00 AM [name], MD [facility]   Lab/Order associations:   ICD-10-CM   1. Fatigue, unspecified type  R53.83 CBC with Differential/Platelet    Comprehensive metabolic panel    TSH    2. Low blood pressure reading  R03.1     3. Polyuria  R35.89 POCT Urinalysis Dipstick (Automated)    4. Hyperlipidemia, unspecified hyperlipidemia type  E78.5 CBC with Differential/Platelet    Comprehensive metabolic panel    TSH    5. Gastroesophageal reflux disease with esophagitis, unspecified whether hemorrhage  K21.00     6. High risk medication use  Z79.899 Vitamin B12      I,Harris Phan,acting as a scribe for Garret Reddish, MD.,have documented all relevant documentation on the behalf of Garret Reddish, MD,as directed by  Garret Reddish, MD while in  the presence of Garret Reddish, MD.  I, Garret Reddish, MD, have reviewed all documentation for this visit. The documentation on 10/04/20 for the exam, diagnosis, procedures, and orders are all accurate and complete.   Time Spent: 30 minutes of total time (8: 03- 8:33 AM) was spent on the date of the encounter performing the following actions: chart review prior to seeing the patient, obtaining history, performing a medically necessary exam, counseling on the treatment plan, placing orders, and documenting in our EHR.    Return precautions advised.  Garret Reddish, MD

## 2020-10-04 ENCOUNTER — Other Ambulatory Visit: Payer: Self-pay

## 2020-10-04 ENCOUNTER — Encounter: Payer: Self-pay | Admitting: Family Medicine

## 2020-10-04 ENCOUNTER — Ambulatory Visit: Payer: 59 | Admitting: Family Medicine

## 2020-10-04 VITALS — BP 108/72 | HR 71 | Temp 98.1°F | Wt 140.0 lb

## 2020-10-04 DIAGNOSIS — R031 Nonspecific low blood-pressure reading: Secondary | ICD-10-CM | POA: Diagnosis not present

## 2020-10-04 DIAGNOSIS — Z79899 Other long term (current) drug therapy: Secondary | ICD-10-CM | POA: Diagnosis not present

## 2020-10-04 DIAGNOSIS — E785 Hyperlipidemia, unspecified: Secondary | ICD-10-CM | POA: Diagnosis not present

## 2020-10-04 DIAGNOSIS — R3589 Other polyuria: Secondary | ICD-10-CM

## 2020-10-04 DIAGNOSIS — R5383 Other fatigue: Secondary | ICD-10-CM | POA: Diagnosis not present

## 2020-10-04 DIAGNOSIS — E559 Vitamin D deficiency, unspecified: Secondary | ICD-10-CM

## 2020-10-04 DIAGNOSIS — K2100 Gastro-esophageal reflux disease with esophagitis, without bleeding: Secondary | ICD-10-CM

## 2020-10-04 LAB — POC URINALSYSI DIPSTICK (AUTOMATED)
Bilirubin, UA: NEGATIVE
Blood, UA: NEGATIVE
Glucose, UA: NEGATIVE
Ketones, UA: NEGATIVE
Leukocytes, UA: NEGATIVE
Nitrite, UA: NEGATIVE
Protein, UA: NEGATIVE
Spec Grav, UA: 1.01 (ref 1.010–1.025)
Urobilinogen, UA: 0.2 E.U./dL
pH, UA: 6 (ref 5.0–8.0)

## 2020-10-04 LAB — CBC WITH DIFFERENTIAL/PLATELET
Basophils Absolute: 0.1 10*3/uL (ref 0.0–0.1)
Basophils Relative: 1 % (ref 0.0–3.0)
Eosinophils Absolute: 0.2 10*3/uL (ref 0.0–0.7)
Eosinophils Relative: 3.1 % (ref 0.0–5.0)
HCT: 40.3 % (ref 39.0–52.0)
Hemoglobin: 13.6 g/dL (ref 13.0–17.0)
Lymphocytes Relative: 35.6 % (ref 12.0–46.0)
Lymphs Abs: 2 10*3/uL (ref 0.7–4.0)
MCHC: 33.6 g/dL (ref 30.0–36.0)
MCV: 90.5 fl (ref 78.0–100.0)
Monocytes Absolute: 0.6 10*3/uL (ref 0.1–1.0)
Monocytes Relative: 11 % (ref 3.0–12.0)
Neutro Abs: 2.7 10*3/uL (ref 1.4–7.7)
Neutrophils Relative %: 49.3 % (ref 43.0–77.0)
Platelets: 195 10*3/uL (ref 150.0–400.0)
RBC: 4.46 Mil/uL (ref 4.22–5.81)
RDW: 13 % (ref 11.5–15.5)
WBC: 5.5 10*3/uL (ref 4.0–10.5)

## 2020-10-04 LAB — COMPREHENSIVE METABOLIC PANEL
ALT: 17 U/L (ref 0–53)
AST: 24 U/L (ref 0–37)
Albumin: 4.2 g/dL (ref 3.5–5.2)
Alkaline Phosphatase: 47 U/L (ref 39–117)
BUN: 20 mg/dL (ref 6–23)
CO2: 27 mEq/L (ref 19–32)
Calcium: 9.5 mg/dL (ref 8.4–10.5)
Chloride: 103 mEq/L (ref 96–112)
Creatinine, Ser: 1.12 mg/dL (ref 0.40–1.50)
GFR: 70.13 mL/min (ref >60.00)
Glucose, Bld: 88 mg/dL (ref 70–99)
Potassium: 4.2 mEq/L (ref 3.5–5.1)
Sodium: 137 mEq/L (ref 135–145)
Total Bilirubin: 0.7 mg/dL (ref 0.2–1.2)
Total Protein: 6.9 g/dL (ref 6.0–8.3)

## 2020-10-04 LAB — TSH: TSH: 2.09 u[IU]/mL (ref 0.35–5.50)

## 2020-10-04 LAB — VITAMIN B12: Vitamin B-12: 376 pg/mL (ref 211–911)

## 2020-10-04 NOTE — Patient Instructions (Addendum)
Health Maintenance Due  Topic Date Due   COVID-19 Vaccine (4 - Booster for [employer] series) Please consider getting your 4th COVID-19 vaccination at you local pharmacy.  06/17/2020   INFLUENZA VACCINE Please consider getting your flu shot in the Fall. If you get this outside of our office, please let us know.  09/10/2020   Please stop by lab before you go If you have mychart- we will send your results within 3 business days of [place] receiving them.  If you do not have mychart- we will call you about results within 5 business days of [place] receiving them.  *please also note that you will see labs on mychart as soon as they post. I will later go in and write notes on them- will say "notes from Dr. [name]"   I suggest trying to push more electrolytes into your diet to help increase your blood pressure.  Please consider purchasing a home cuff to monitor your home blood pressure readings.  With your blood pressure and fatigue, you can monitor with a journal.  Please update me within 2 weeks  about all of the above including night sweats.also let me know if you snore  Recommended follow up: Please keep you May 2023 visit. Unless new or worsening symptoms

## 2020-10-04 NOTE — Addendum Note (Signed)
Addended by: Loura Back on: 10/04/2020 08:46 AM   Modules accepted: Orders

## 2020-10-05 ENCOUNTER — Other Ambulatory Visit: Payer: Self-pay

## 2021-01-20 ENCOUNTER — Encounter: Payer: Self-pay | Admitting: Cardiovascular Disease

## 2021-01-20 DIAGNOSIS — I712 Thoracic aortic aneurysm, without rupture, unspecified: Secondary | ICD-10-CM

## 2021-01-21 NOTE — Telephone Encounter (Signed)
Called patient with Dr. Kyla Balzarine advisement. Order placed for CTA and BMET. Informed patient that someone will call him to schedule.

## 2021-01-24 ENCOUNTER — Other Ambulatory Visit: Payer: 59

## 2021-01-24 ENCOUNTER — Other Ambulatory Visit: Payer: Self-pay

## 2021-01-24 DIAGNOSIS — I712 Thoracic aortic aneurysm, without rupture, unspecified: Secondary | ICD-10-CM

## 2021-01-24 LAB — BASIC METABOLIC PANEL
BUN/Creatinine Ratio: 20 (ref 10–24)
BUN: 21 mg/dL (ref 8–27)
CO2: 27 mmol/L (ref 20–29)
Calcium: 9.5 mg/dL (ref 8.6–10.2)
Chloride: 104 mmol/L (ref 96–106)
Creatinine, Ser: 1.06 mg/dL (ref 0.76–1.27)
Glucose: 91 mg/dL (ref 70–99)
Potassium: 4.5 mmol/L (ref 3.5–5.2)
Sodium: 139 mmol/L (ref 134–144)
eGFR: 79 mL/min/{1.73_m2} (ref >59)

## 2021-01-25 ENCOUNTER — Other Ambulatory Visit: Payer: 59

## 2021-01-30 ENCOUNTER — Other Ambulatory Visit: Payer: Self-pay

## 2021-01-30 ENCOUNTER — Ambulatory Visit (HOSPITAL_COMMUNITY)
Admission: RE | Admit: 2021-01-30 | Discharge: 2021-01-30 | Disposition: A | Discharge status: Home / Self Care | Payer: 59 | Source: Ambulatory Visit | Attending: Cardiovascular Disease | Admitting: Cardiovascular Disease

## 2021-01-30 ENCOUNTER — Encounter: Payer: Self-pay | Admitting: Cardiovascular Disease

## 2021-01-30 DIAGNOSIS — I712 Thoracic aortic aneurysm, without rupture, unspecified: Secondary | ICD-10-CM

## 2021-01-30 MED ORDER — IOHEXOL 350 MG/ML SOLN
100.0000 mL | Freq: Once | INTRAVENOUS | Status: AC | PRN
  Administered 2021-01-30: 17:00:00 100 mL via INTRAVENOUS

## 2021-01-31 ENCOUNTER — Encounter: Payer: Self-pay | Admitting: Cardiovascular Disease

## 2021-02-26 ENCOUNTER — Other Ambulatory Visit: Payer: Self-pay

## 2021-02-26 ENCOUNTER — Ambulatory Visit (INDEPENDENT_AMBULATORY_CARE_PROVIDER_SITE_OTHER): Payer: BC Managed Care – PPO | Admitting: Family Medicine

## 2021-02-26 ENCOUNTER — Encounter: Payer: Self-pay | Admitting: Family Medicine

## 2021-02-26 VITALS — BP 110/64 | HR 61 | Temp 97.9°F | Ht 66.0 in | Wt 142.5 lb

## 2021-02-26 DIAGNOSIS — R0781 Pleurodynia: Secondary | ICD-10-CM | POA: Diagnosis not present

## 2021-02-26 NOTE — Progress Notes (Signed)
Subjective:     Patient ID: Jay Combs, male    DOB: 1955/03/10, 66 y.o.   MRN: [id number]  Chief Complaint  Patient presents with   Lump    Discomfort in left lower rib cage started 1 month ago Noticed a lump on that side     HPI About 1 month, mild pain under L lower rib.  When sleeps on R side, feels it on L.  Intermitt-no ppt factors.  No SOB. Noticed lump few nights-pain and feeling around and felt it.  Pain to push on it.  No rad, but chronic LBP.  Has been lifting wts.  No specific injury. No f/c/wt loss.no cough. Cscope 1 yr ago  Health Maintenance Due  Topic Date Due   Pneumococcal Vaccine 77-78 Years old (1 - PCV) Never done   COVID-19 Vaccine (4 - Booster for [employer] series) 04/14/2020    Past Medical History:  Diagnosis Date   ALLERGIC RHINITIS 09/25/2006   BRADYCARDIA 09/25/2006   athletic Heart rate   CHEST PAIN UNSPECIFIED 10/28/2007   cardiology evaluation including stress test reassuring   INSOMNIA, CHRONIC 08/09/2008   PALPITATIONS, CHRONIC 10/28/2007    Past Surgical History:  Procedure Laterality Date   COLONOSCOPY  11/09/2008   [facility]-normal   COLONOSCOPY  2022   [facility]-normal GERD   NASAL SEPTUM SURGERY      Outpatient Medications Prior to Visit  Medication Sig Dispense Refill   Cholecalciferol (VITAMIN D3) 25 MCG (1000 UT) CAPS Take 1 capsule by mouth daily.     Cyanocobalamin (VITAMIN B-12 PO) Take 1 tablet by mouth 2 (two) times a week.     Misc Natural Products (TART CHERRY ADVANCED PO) Take 1 tablet by mouth daily.     Omega-3 Fatty Acids (FISH OIL PO) Take 2 capsules by mouth daily in the afternoon.     omeprazole (PRILOSEC) 40 MG capsule Take 1 capsule (40 mg total) by mouth daily. Best to take on an empty stomach, take about 30 minutes before breakfast. 90 capsule 3   Magnesium 100 MG TABS Take by mouth. Before bed- patient finds relaxing (Patient not taking: Reported on 02/26/2021)     No facility-administered  medications prior to visit.    Allergies  Allergen Reactions   Demerol [Meperidine] Other (See Comments)    Decreased BP   QQV:ZDGLOVFI/EPPIRJJOACZYSAY except as noted in HPI      Objective:     BP 110/64    Pulse 61    Temp 97.9 F (36.6 C) (Temporal)    Ht 5\' 6"  (1.676 m)    Wt 142 lb 8 oz (64.6 kg)    SpO2 99%    BMI 23.00 kg/m  Wt Readings from Last 3 Encounters:  02/26/21 142 lb 8 oz (64.6 kg)  10/04/20 140 lb (63.5 kg)  10/01/20 145 lb 8 oz (66 kg)        Gen: WDWN NAD WM HEENT: NCAT, conjunctiva not injected, sclera nonicteric NECK:  supple, no thyromegaly, no nodes, no carotid bruits CARDIAC: RRR, S1S2+, no murmur. DP 2+B LUNGS: CTAB. No wheezes ABDOMEN:  BS+, soft, NTND, No HSM, no masses EXT:  no edema MSK: no gross abnormalities.  NEURO: A&O x3.  CN II-XII intact.  PSYCH: normal mood. Good eye contact Tender spot L lower rib-seems to be floating rib on L and more prominent than R.   Not TTP R  Assessment & Plan:   Problem List Items Addressed This Visit   None Visit  Diagnoses     Rib pain    -  Primary      "Lump" and spot tenderness L lower rib area.  ? Floating rib.  Reviewed imaging reports-CTA chest done 01/30/21- will d/w radiology to see if anything else.  No orders of the defined types were placed in this encounter.   Wellington Hampshire, MD

## 2021-02-27 ENCOUNTER — Other Ambulatory Visit: Payer: Self-pay | Admitting: Family Medicine

## 2021-02-27 DIAGNOSIS — R0781 Pleurodynia: Secondary | ICD-10-CM

## 2021-02-27 NOTE — Progress Notes (Signed)
Spoke to pt.  Wil go to [place] office for rib films as not seen on ct the area in question

## 2021-02-28 ENCOUNTER — Other Ambulatory Visit: Payer: Self-pay

## 2021-02-28 ENCOUNTER — Ambulatory Visit (INDEPENDENT_AMBULATORY_CARE_PROVIDER_SITE_OTHER)
Admission: RE | Admit: 2021-02-28 | Discharge: 2021-02-28 | Disposition: A | Discharge status: Home / Self Care | Payer: BC Managed Care – PPO | Source: Ambulatory Visit | Attending: Family Medicine | Admitting: Family Medicine

## 2021-02-28 DIAGNOSIS — R0781 Pleurodynia: Secondary | ICD-10-CM

## 2021-02-28 DIAGNOSIS — R0789 Other chest pain: Secondary | ICD-10-CM | POA: Diagnosis not present

## 2021-03-01 ENCOUNTER — Encounter: Payer: Self-pay | Admitting: Family Medicine

## 2021-04-08 ENCOUNTER — Encounter: Payer: Self-pay | Admitting: Family Medicine

## 2021-04-09 ENCOUNTER — Other Ambulatory Visit: Payer: Self-pay

## 2021-04-09 DIAGNOSIS — M545 Low back pain, unspecified: Secondary | ICD-10-CM

## 2021-04-10 ENCOUNTER — Encounter: Payer: Self-pay | Admitting: Specialist

## 2021-04-11 NOTE — Progress Notes (Signed)
? ?  I, Wendy Poet, LAT, ATC, am serving as scribe for Dr. Lynne Leader. ? ?Jay Combs is a 66 y.o. male who presents to [place] at [facility] today for f/u of chronic low back pain.  He was last seen by Dr. Tamala Julian on 01/17/19 for lumbar radiculopathy and subsequently had a L L5 nerve root block and transforaminal ESI.  Today, pt reports LBP and stiffness for about 6 weeks.  He locates his pain to his entire low back.  He reports some radiating pain into his R buttock.  He denies any radiating pain or paresthesias into his legs.  ? ?Diagnostic testing: L-spine MRI- 01/12/19; L-spine XR- 11/16/18 ? ? ?Pertinent review of systems: No fevers or chills ? ?Relevant historical information: Hyperlipidemia ? ? ?Exam:  ?BP 96/64 (BP Location: Right Arm, Patient Position: Sitting, Cuff Size: Normal)   Pulse 78   Ht 5\' 6"  (1.676 m)   Wt 140 lb (63.5 kg)   SpO2 98%   BMI 22.60 kg/m?  ?General: Well Developed, well nourished, and in no acute distress.  ? ?MSK: L-spine: Normal-appearing ?Nontender to midline. ?Minimally tender lumbar paraspinal musculature. ?Normal lumbar motion. ?Lower extremity strength is intact. ? ? ? ?Lab and Radiology Results ? ? ?X-ray images L-spine obtained today personally and independently interpreted. ?X-rays compared to MRI in 2020. 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 ?Await formal radiology review ? ? ? ?Assessment and Plan: ?66 y.o. male with low back pain without radiculopathy.  Patient does have some degenerative changes in his lumbar spine on today's x-ray per my read and on his MRI in 2020.  Think however the main source of his current pain is muscle spasm and dysfunction.  Plan for heating pad and TENS unit.  Recommend physical therapy but he would like to try his massage therapist first.  If not improving he can let me know and I can refer to PT.  We also discussed muscle relaxers and based on the ongoing side effects he would like to avoid them for  now. ? ? ?PDMP not reviewed this encounter. ?Orders Placed This Encounter  ?Procedures  ? DG Lumbar Spine 2-3 Views  ?  Standing Status:   Future  ?  Number of Occurrences:   1  ?  Standing Expiration Date:   04/13/2022  ?  Order Specific Question:   Reason for Exam (SYMPTOM  OR DIAGNOSIS REQUIRED)  ?  Answer:   cronic low back ain  ?  Order Specific Question:   Preferred imaging location?  ?  Answer:   [facility]  ? ?No orders of the defined types were placed in this encounter. ? ? ? ?Discussed warning signs or symptoms. Please see discharge instructions. Patient expresses understanding. ? ? ?The above documentation has been reviewed and is accurate and complete Lynne Leader, M.D. ? ? ?

## 2021-04-12 ENCOUNTER — Encounter: Payer: Self-pay | Admitting: Family Medicine

## 2021-04-12 ENCOUNTER — Ambulatory Visit: Payer: BC Managed Care – PPO | Admitting: Family Medicine

## 2021-04-12 ENCOUNTER — Other Ambulatory Visit: Payer: Self-pay

## 2021-04-12 ENCOUNTER — Ambulatory Visit (INDEPENDENT_AMBULATORY_CARE_PROVIDER_SITE_OTHER): Payer: BC Managed Care – PPO

## 2021-04-12 VITALS — BP 96/64 | HR 78 | Ht 66.0 in | Wt 140.0 lb

## 2021-04-12 DIAGNOSIS — M545 Low back pain, unspecified: Secondary | ICD-10-CM

## 2021-04-12 DIAGNOSIS — G8929 Other chronic pain: Secondary | ICD-10-CM

## 2021-04-12 NOTE — Patient Instructions (Addendum)
Good to see you today. ? ?Follow-up: as needed.  ? ?Please get an Xray today before you leave  ? ?Heating pad ? ?TENS UNIT: ?This is helpful for muscle pain and spasm.  ? ?Search and Purchase a TENS 7000 2nd edition at  ?www.[email or web address] or [email or web address] ?It should be less than $30.  ? ? ? ?TENS unit instructions: Do not shower or bathe with the unit on ?Turn the unit off before removing electrodes or batteries ?If the electrodes lose stickiness add a drop of water to the electrodes after they are disconnected from the unit and place on plastic sheet. If you continued to have difficulty, call the TENS unit company to purchase more electrodes. ?Do not apply lotion on the skin area prior to use. Make sure the skin is clean and dry as this will help prolong the life of the electrodes. ?After use, always check skin for unusual red areas, rash or other skin difficulties. If there are any skin problems, does not apply electrodes to the same area. ?Never remove the electrodes from the unit by pulling the wires. ?Do not use the TENS unit or electrodes other than as directed. ?Do not change electrode placement without consultating your therapist or physician. ?Keep 2 fingers with between each electrode. ?Wear time ratio is 2:1, on to off times.   ? ?For example on for 30 minutes off for 15 minutes and then on for 30 minutes off for 15 minutes ? ?  ?

## 2021-04-15 ENCOUNTER — Encounter: Payer: Self-pay | Admitting: Family Medicine

## 2021-04-15 DIAGNOSIS — I700 Atherosclerosis of aorta: Secondary | ICD-10-CM | POA: Insufficient documentation

## 2021-04-15 NOTE — Progress Notes (Signed)
Lumbar spine x-ray shows some arthritis changes of the low back.

## 2021-04-19 ENCOUNTER — Encounter: Payer: Self-pay | Admitting: Gastroenterology

## 2021-04-19 ENCOUNTER — Other Ambulatory Visit: Payer: Self-pay

## 2021-04-20 ENCOUNTER — Encounter: Payer: Self-pay | Admitting: Gastroenterology

## 2021-04-22 ENCOUNTER — Other Ambulatory Visit: Payer: Self-pay

## 2021-04-22 MED ORDER — PANTOPRAZOLE SODIUM 40 MG PO TBEC: 40.0000 mg | DELAYED_RELEASE_TABLET | Freq: Every day | ORAL | 11 refills | Status: DC

## 2021-05-06 ENCOUNTER — Other Ambulatory Visit: Payer: Self-pay

## 2021-05-06 MED ORDER — DEXLANSOPRAZOLE 60 MG PO CPDR: 60.0000 mg | DELAYED_RELEASE_CAPSULE | Freq: Every day | ORAL | 5 refills | Status: DC

## 2021-05-29 ENCOUNTER — Ambulatory Visit: Payer: BC Managed Care – PPO | Admitting: Specialist

## 2021-06-01 ENCOUNTER — Encounter: Payer: Self-pay | Admitting: Family Medicine

## 2021-06-01 DIAGNOSIS — G8929 Other chronic pain: Secondary | ICD-10-CM

## 2021-06-20 ENCOUNTER — Ambulatory Visit: Payer: BC Managed Care – PPO | Admitting: Physical Therapy

## 2021-06-20 DIAGNOSIS — M5459 Other low back pain: Secondary | ICD-10-CM

## 2021-06-20 NOTE — Therapy (Signed)
OUTPATIENT PHYSICAL THERAPY THORACOLUMBAR EVALUATION   Patient Name: Panfilo Ketchum MRN: [id number] DOB:1955/09/19, 66 y.o., male Today's Date: 06/20/2021   PT End of Session - 06/27/21 2108     Visit Number 1    Number of Visits 12    Date for PT Re-Evaluation 08/07/21    Authorization Type BCBS    PT Start Time 5397    PT Stop Time 1512    PT Time Calculation (min) 41 min    Activity Tolerance Patient tolerated treatment well    Behavior During Therapy [facility] for tasks assessed/performed              Past Medical History:  Diagnosis Date   ALLERGIC RHINITIS 09/25/2006   BRADYCARDIA 09/25/2006   athletic Heart rate   CHEST PAIN UNSPECIFIED 10/28/2007   cardiology evaluation including stress test reassuring   INSOMNIA, CHRONIC 08/09/2008   PALPITATIONS, CHRONIC 10/28/2007   Past Surgical History:  Procedure Laterality Date   COLONOSCOPY  11/09/2008   [facility]-normal   COLONOSCOPY  2022   [facility]-normal GERD   NASAL SEPTUM SURGERY     Patient Active Problem List   Diagnosis Date Noted   Aortic atherosclerosis ([place]) 04/15/2021   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[place]) 08/20/2020   Sensation of fullness in both ears 10/26/2019   Lumbar radiculopathy 12/15/2018   Closed disp fracture of left lateral malleolus with delayed healing 10/15/2018   Anxiety 08/12/2017   GERD (gastroesophageal reflux disease) 01/12/2017   Macular pucker of both eyes 01/12/2017   Hyperlipidemia 01/12/2017   Ptosis, right 01/12/2017   PALPITATIONS, CHRONIC 10/28/2007    PCP: Garret Reddish  REFERRING PROVIDER: Lynne Leader  REFERRING DIAG: Low back pain   THERAPY DIAG:  Other low back pain  ONSET DATE:   SUBJECTIVE:                                                                                                                                                                                           SUBJECTIVE STATEMENT: Pt reports ongoing LBP. He has had Previous injections, helped some, but Still  painful Most pain is  stiffness, in AM. He also feels weak in both thighs at times. He is able to exercise some, 3 mi on elliptical, 4 days/wk. Tawnya Crook, sits a lot. Mostly pain free when sitting.  Notes pain bilaterally, and into R glute,     PERTINENT HISTORY:    PAIN:  Are you having pain? Yes: NPRS scale: 5/10 Pain location: lumbar, into Bil glutes Pain description: stiffness, tightness, pain Aggravating factors: standing, walking, getting up and down  Relieving factors: sitting  PRECAUTIONS: None  WEIGHT BEARING RESTRICTIONS No  FALLS:  Has patient fallen in last 6 months? No  PLOF: Independent  PATIENT GOALS  decreased pain   OBJECTIVE:   DIAGNOSTIC FINDINGS:  Xray 04/13/21:  Multilevel degenerative disc disease relatively mild with small anterior osteophytes. Mild lower lumbar facet degenerative changes identified. Mild calcified atherosclerosis in the abdominal aorta.   COGNITION:  Overall cognitive status: Within functional limits for tasks assessed      POSTURE:  Flexible, poor seated posture,   PALPATION: Tenderness in bil lumbar, minimal pain to palpate today, tightness in bil paraspinals, into R glute   LUMBAR ROM:   Lumbar: WFL,  mild limitation for extension (uncomfortable) Hip ROM: WFL   LE MMT: Hip flex: 4/5, abd : 4-/5 , ext: 4-5/   LUMBAR SPECIAL TESTS:  Neg SLR    TODAY'S TREATMENT  See below for HEP   PATIENT EDUCATION:  Education details: PT POC, exam findings, HEP Person educated: Patient Education method: Explanation, Demonstration, Tactile cues, Verbal cues, and Handouts Education comprehension: verbalized understanding, returned demonstration, verbal cues required, tactile cues required, and needs further education   HOME EXERCISE PROGRAM: Access Code: [email or web address] URL: [email or web address] Date: 06/28/2021 Prepared by: Lyndee Hensen  Exercises - Supine Single Knee to Chest Stretch  - 2 x  daily - 3 reps - 30 hold - Supine Posterior Pelvic Tilt  - 2 x daily - 2 sets - 10 reps - 3 hold - Supine Piriformis Stretch Pulling Heel to Hip  - 2 x daily - 3 reps - 30 hold - Cat Cow  - 1-2 x daily - 2 sets - 10 reps - 5 hold   ASSESSMENT:  CLINICAL IMPRESSION: Pt presents with primary complaint of increased pain in back. He has stiffness in lumbar region, with decreased ability for full /painfree ROM. He has increased pain into R glue and LE, and has decreased tolerance for standing/walking activity due to pain. Pt to benefit from skilled PT to improve deficits and pain.    OBJECTIVE IMPAIRMENTS decreased activity tolerance, decreased mobility, decreased ROM, decreased strength, increased muscle spasms, impaired flexibility, improper body mechanics, and pain.   ACTIVITY LIMITATIONS cleaning, shopping, community activity, and yard work.   PERSONAL FACTORS  none  are also affecting patient's functional outcome.    REHAB POTENTIAL: Good  CLINICAL DECISION MAKING: Stable/uncomplicated  EVALUATION COMPLEXITY: Low   GOALS: Goals reviewed with patient? Yes  SHORT TERM GOALS: Target date: 07/11/21  Patient to be independent with initial HEP  Goal status: INITIAL  2.  Patient to demo optimal seated posture in clinic, and report doing posture checks while at work daily.   Goal status: INITIAL   LONG TERM GOALS: Target date: 08/07/21  Patient to be independent with final HEP  Goal status: INITIAL   2.  Patient to demo improved strength of hips and core to at least 4+/5 , to improve stability and pain  Goal status: INITIAL  3.  Patient to demo ability for bend, lift, squat, with optimal mechanics, to improve ability for IADLs.   Goal status: INITIAL  5.  Pt to report decreased pain in back and LE to 0-2/10 with standing and walking activity, up to 1 hr.   Goal status: INITIAL     PLAN: PT FREQUENCY: 1-2x/week  PT DURATION: 6 weeks  PLANNED INTERVENTIONS:  Therapeutic exercises, Therapeutic activity, Neuromuscular re-education, Balance training, Gait training, Patient/Family education, Joint manipulation, Joint mobilization, Stair training, DME instructions, Dry Needling, Electrical stimulation,  Spinal manipulation, Spinal mobilization, Cryotherapy, Moist heat, Taping, Vasopneumatic device, Traction, Ultrasound, Ionotophoresis '4mg'$ /ml Dexamethasone, and Manual therapy.  PLAN FOR NEXT SESSION:   Lyndee Hensen, PT, DPT 9:08 PM  06/27/21

## 2021-06-26 ENCOUNTER — Encounter: Payer: Self-pay | Admitting: Physical Therapy

## 2021-06-26 ENCOUNTER — Encounter: Payer: BC Managed Care – PPO | Admitting: Physical Therapy

## 2021-06-27 ENCOUNTER — Encounter: Payer: Self-pay | Admitting: Physical Therapy

## 2021-06-28 ENCOUNTER — Encounter: Payer: BC Managed Care – PPO | Admitting: Physical Therapy

## 2021-07-08 ENCOUNTER — Encounter: Payer: 59 | Admitting: Family Medicine

## 2021-07-09 ENCOUNTER — Encounter: Payer: 59 | Admitting: Family Medicine

## 2021-07-10 ENCOUNTER — Ambulatory Visit: Payer: BC Managed Care – PPO | Admitting: Physical Therapy

## 2021-07-10 DIAGNOSIS — M5459 Other low back pain: Secondary | ICD-10-CM | POA: Diagnosis not present

## 2021-07-10 NOTE — Therapy (Signed)
OUTPATIENT PHYSICAL THERAPY THORACOLUMBAR TREATMENT   Patient Name: Deshun Sedivy MRN: [id number] DOB:[birth year], 66 y.o., male Today's Date: 07/10/2021   PT End of Session - 07/14/21 1643     Visit Number 2    Number of Visits 12    Date for PT Re-Evaluation 08/07/21    Authorization Type BCBS    PT Start Time 6789    PT Stop Time 3810    PT Time Calculation (min) 42 min    Activity Tolerance Patient tolerated treatment well    Behavior During Therapy [facility] for tasks assessed/performed               Past Medical History:  Diagnosis Date   ALLERGIC RHINITIS 09/25/2006   BRADYCARDIA 09/25/2006   athletic Heart rate   CHEST PAIN UNSPECIFIED 10/28/2007   cardiology evaluation including stress test reassuring   INSOMNIA, CHRONIC 08/09/2008   PALPITATIONS, CHRONIC 10/28/2007   Past Surgical History:  Procedure Laterality Date   COLONOSCOPY  11/09/2008   [facility]-normal   COLONOSCOPY  2022   [facility]-normal GERD   NASAL SEPTUM SURGERY     Patient Active Problem List   Diagnosis Date Noted   Aortic atherosclerosis ([place]) 04/15/2021   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[place]) 08/20/2020   Sensation of fullness in both ears 10/26/2019   Lumbar radiculopathy 12/15/2018   Closed disp fracture of left lateral malleolus with delayed healing 10/15/2018   Anxiety 08/12/2017   GERD (gastroesophageal reflux disease) 01/12/2017   Macular pucker of both eyes 01/12/2017   Hyperlipidemia 01/12/2017   Ptosis, right 01/12/2017   PALPITATIONS, CHRONIC 10/28/2007    PCP: Garret Reddish  REFERRING PROVIDER: Lynne Leader  REFERRING DIAG: Low back pain   THERAPY DIAG:  Other low back pain  ONSET DATE:   SUBJECTIVE:                                                                                                                                                                                           SUBJECTIVE STATEMENT:    States continued pain.   Pt reports ongoing LBP. He has had Previous  injections, helped some, but Still painful Most pain is  stiffness, in AM. He also feels weak in both thighs at times. He is able to exercise some, 3 mi on elliptical, 4 days/wk. Tawnya Crook, sits a lot. Mostly pain free when sitting.  Notes pain bilaterally, and into R glute,     PERTINENT HISTORY:    PAIN:  Are you having pain? Yes: NPRS scale: 5/10 Pain location: lumbar, into Bil glutes Pain description: stiffness, tightness, pain Aggravating factors: standing, walking, getting  up and down  Relieving factors: sitting    PRECAUTIONS: None  WEIGHT BEARING RESTRICTIONS No  FALLS:  Has patient fallen in last 6 months? No  PLOF: Independent  PATIENT GOALS  decreased pain   OBJECTIVE:   DIAGNOSTIC FINDINGS:  Xray 04/13/21:  Multilevel degenerative disc disease relatively mild with small anterior osteophytes. Mild lower lumbar facet degenerative changes identified. Mild calcified atherosclerosis in the abdominal aorta.   COGNITION:  Overall cognitive status: Within functional limits for tasks assessed      POSTURE:  Flexible, poor seated posture,   PALPATION: Tenderness in bil lumbar, minimal pain to palpate today, tightness in bil paraspinals, into R glute   LUMBAR ROM:   Lumbar: WFL,  mild limitation for extension (uncomfortable) Hip ROM: WFL   LE MMT: Hip flex: 4/5, abd : 4-/5 , ext: 4-5/   LUMBAR SPECIAL TESTS:  Neg SLR    TODAY'S TREATMENT   Therapeutic Exercise:  Aerobic: Supine: Seated: Standing: Stretches: SKTC 30 sec x 3 bil; Cat cow x 20; LTR x 15; Pelvic tilts x 15;  Neuromuscular Re-education: Manual Therapy: DTM/TPR bil lumbar paraspinals, PA mobs lumbar and thoracic;  Trigger Point Dry-Needling  Treatment instructions: Expect mild to moderate muscle soreness. S/S of pneumothorax if dry needled over a lung field, and to seek immediate medical attention should they occur. Patient verbalized understanding of these instructions and  education.  Patient Consent Given: Yes Education handout provided: Yes Muscles treated: lumbar multifidi, bil;  Electrical stimulation performed: No Parameters: N/A Treatment response/outcome: palpable increase muscle length;     PATIENT EDUCATION:  Education details: reviewed HEP Person educated: Patient Education method: Explanation, Demonstration, Tactile cues, Verbal cues, and Handouts Education comprehension: verbalized understanding, returned demonstration, verbal cues required, tactile cues required, and needs further education   HOME EXERCISE PROGRAM: Access Code: [email or web address]    ASSESSMENT:  CLINICAL IMPRESSION: Pt with continued stiffness feeling in back. He is doing better with taking more breaks from seated position at work. Reviewed HEP and correct mechanics for cat/cow. He will benefit from continued t and L spine mobility, progressing to strengthening when able. Focus today for decreasing pain and muscle tension with manual therapy, good tolerance for DN, will assess effects next visit.    OBJECTIVE IMPAIRMENTS decreased activity tolerance, decreased mobility, decreased ROM, decreased strength, increased muscle spasms, impaired flexibility, improper body mechanics, and pain.   ACTIVITY LIMITATIONS cleaning, shopping, community activity, and yard work.   PERSONAL FACTORS  none  are also affecting patient's functional outcome.    REHAB POTENTIAL: Good  CLINICAL DECISION MAKING: Stable/uncomplicated  EVALUATION COMPLEXITY: Low   GOALS: Goals reviewed with patient? Yes  SHORT TERM GOALS: Target date: 07/11/21  Patient to be independent with initial HEP  Goal status: INITIAL  2.  Patient to demo optimal seated posture in clinic, and report doing posture checks while at work daily.   Goal status: INITIAL   LONG TERM GOALS: Target date: 08/07/21  Patient to be independent with final HEP  Goal status: INITIAL   2.  Patient to demo improved strength of  hips and core to at least 4+/5 , to improve stability and pain  Goal status: INITIAL  3.  Patient to demo ability for bend, lift, squat, with optimal mechanics, to improve ability for IADLs.   Goal status: INITIAL  5.  Pt to report decreased pain in back and LE to 0-2/10 with standing and walking activity, up to 1 hr.   Goal status: INITIAL  PLAN: PT FREQUENCY: 1-2x/week  PT DURATION: 6 weeks  PLANNED INTERVENTIONS: Therapeutic exercises, Therapeutic activity, Neuromuscular re-education, Balance training, Gait training, Patient/Family education, Joint manipulation, Joint mobilization, Stair training, DME instructions, Dry Needling, Electrical stimulation, Spinal manipulation, Spinal mobilization, Cryotherapy, Moist heat, Taping, Vasopneumatic device, Traction, Ultrasound, Ionotophoresis '4mg'$ /ml Dexamethasone, and Manual therapy.  PLAN FOR NEXT SESSION:   Lyndee Hensen, PT, DPT 4:47 PM  07/14/21

## 2021-07-12 ENCOUNTER — Encounter: Payer: BC Managed Care – PPO | Admitting: Physical Therapy

## 2021-07-14 ENCOUNTER — Encounter: Payer: Self-pay | Admitting: Physical Therapy

## 2021-07-15 ENCOUNTER — Encounter: Payer: BC Managed Care – PPO | Admitting: Physical Therapy

## 2021-07-18 ENCOUNTER — Ambulatory Visit (INDEPENDENT_AMBULATORY_CARE_PROVIDER_SITE_OTHER): Payer: BC Managed Care – PPO

## 2021-07-18 ENCOUNTER — Ambulatory Visit (INDEPENDENT_AMBULATORY_CARE_PROVIDER_SITE_OTHER): Payer: BC Managed Care – PPO | Admitting: Physical Therapy

## 2021-07-18 ENCOUNTER — Encounter: Payer: Self-pay | Admitting: Family Medicine

## 2021-07-18 ENCOUNTER — Encounter: Payer: BC Managed Care – PPO | Admitting: Physical Therapy

## 2021-07-18 ENCOUNTER — Ambulatory Visit: Payer: BC Managed Care – PPO | Admitting: Family Medicine

## 2021-07-18 VITALS — BP 102/72 | HR 63 | Ht 66.0 in | Wt 142.0 lb

## 2021-07-18 DIAGNOSIS — R1031 Right lower quadrant pain: Secondary | ICD-10-CM

## 2021-07-18 DIAGNOSIS — M5459 Other low back pain: Secondary | ICD-10-CM

## 2021-07-18 DIAGNOSIS — M25551 Pain in right hip: Secondary | ICD-10-CM | POA: Diagnosis not present

## 2021-07-18 NOTE — Therapy (Signed)
OUTPATIENT PHYSICAL THERAPY THORACOLUMBAR TREATMENT   Patient Name: Jay Combs MRN: [id number] DOB:November 01, 1955, 66 y.o., male Today's Date: 07/18/2021   PT End of Session - 07/19/21 0920     Visit Number 3    Number of Visits 12    Date for PT Re-Evaluation 08/07/21    Authorization Type BCBS    PT Start Time 1432    PT Stop Time 1513    PT Time Calculation (min) 41 min    Activity Tolerance Patient tolerated treatment well    Behavior During Therapy [facility] for tasks assessed/performed                Past Medical History:  Diagnosis Date   ALLERGIC RHINITIS 09/25/2006   BRADYCARDIA 09/25/2006   athletic Heart rate   CHEST PAIN UNSPECIFIED 10/28/2007   cardiology evaluation including stress test reassuring   INSOMNIA, CHRONIC 08/09/2008   PALPITATIONS, CHRONIC 10/28/2007   Past Surgical History:  Procedure Laterality Date   COLONOSCOPY  11/09/2008   Stark-normal   COLONOSCOPY  2022   Stark-normal GERD   NASAL SEPTUM SURGERY     Patient Active Problem List   Diagnosis Date Noted   Aortic atherosclerosis ([place]) 04/15/2021   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[place]) 08/20/2020   Sensation of fullness in both ears 10/26/2019   Lumbar radiculopathy 12/15/2018   Closed disp fracture of left lateral malleolus with delayed healing 10/15/2018   Anxiety 08/12/2017   GERD (gastroesophageal reflux disease) 01/12/2017   Macular pucker of both eyes 01/12/2017   Hyperlipidemia 01/12/2017   Ptosis, right 01/12/2017   PALPITATIONS, CHRONIC 10/28/2007    PCP: Garret Reddish  REFERRING PROVIDER: Lynne Leader  REFERRING DIAG: Low back pain   THERAPY DIAG:  Other low back pain  ONSET DATE:   SUBJECTIVE:                                                                                                                                                                                           SUBJECTIVE STATEMENT:  07/18/21  Pt reports new pain in R sided glute and in R anterior hip in last  2 days. Also has an appt with Sports Med later today to assess.   Eval: Pt reports ongoing LBP. He has had Previous injections, helped some, but Still painful Most pain is  stiffness, in AM. He also feels weak in both thighs at times. He is able to exercise some, 3 mi on elliptical, 4 days/wk. Tawnya Crook, sits a lot. Mostly pain free when sitting.  Notes pain bilaterally, and into R glute,     PERTINENT HISTORY:  PAIN:  Are you having pain? Yes: NPRS scale: 5/10 Pain location: lumbar, into Bil glutes Pain description: stiffness, tightness, pain Aggravating factors: standing, walking, getting up and down  Relieving factors: sitting    PRECAUTIONS: None  WEIGHT BEARING RESTRICTIONS No  FALLS:  Has patient fallen in last 6 months? No  PLOF: Independent  PATIENT GOALS  decreased pain   OBJECTIVE:   DIAGNOSTIC FINDINGS:  Xray 04/13/21:  Multilevel degenerative disc disease relatively mild with small anterior osteophytes. Mild lower lumbar facet degenerative changes identified. Mild calcified atherosclerosis in the abdominal aorta.   COGNITION:  Overall cognitive status: Within functional limits for tasks assessed      POSTURE:  Flexible, poor seated posture,   PALPATION: Tenderness in bil lumbar, minimal pain to palpate today, tightness in bil paraspinals, into R glute   LUMBAR ROM:   Lumbar: WFL,  mild limitation for extension (uncomfortable) Hip ROM: WFL   LE MMT: Hip flex: 4/5, abd : 4-/5 , ext: 4-5/   LUMBAR SPECIAL TESTS:  Neg SLR    TODAY'S TREATMENT   Therapeutic Exercise: 07/18/21 Aerobic: Supine: Bridging x 15; pelvic tilts x 10 (review to go easy)  Seated: Standing: Stretches: SKTC 30 sec x 3 bil;  LTR x 15; Hip flexor stretch off side of table 30 sec x 3 on R;   Neuromuscular Re-education: Manual Therapy: DTM/TPR to R glute med, min,  R hip inf mobs, long leg distraction for R hip.    PATIENT EDUCATION:  Education details:  reviewed HEP, education on tennis ball DTM for R glute.  Person educated: Patient Education method: Explanation, Demonstration, Tactile cues, Verbal cues, and Handouts Education comprehension: verbalized understanding, returned demonstration, verbal cues required, tactile cues required, and needs further education   HOME EXERCISE PROGRAM: Access Code: [email or web address]    ASSESSMENT:  CLINICAL IMPRESSION: Pt with continued stiffness feeling in back, mostly in AMs. He does have increased pain in R glute musculature today, addressed with manual for soft tissue release. He also has increased pain in anterior hip, mostly with seated position or increased trunk flexion in sitting. Minimal pain to palpate hip flexor muscle, but does have pain along groin. Pain not reproduced with hip movement today. Will also see sports med later today for further assessment, will continue to monitor. Pt to benefit from continued care for low back, glute, and hip pain.   OBJECTIVE IMPAIRMENTS decreased activity tolerance, decreased mobility, decreased ROM, decreased strength, increased muscle spasms, impaired flexibility, improper body mechanics, and pain.   ACTIVITY LIMITATIONS cleaning, shopping, community activity, and yard work.   PERSONAL FACTORS  none  are also affecting patient's functional outcome.    REHAB POTENTIAL: Good  CLINICAL DECISION MAKING: Stable/uncomplicated  EVALUATION COMPLEXITY: Low   GOALS: Goals reviewed with patient? Yes  SHORT TERM GOALS: Target date: 07/11/21  Patient to be independent with initial HEP  Goal status: INITIAL  2.  Patient to demo optimal seated posture in clinic, and report doing posture checks while at work daily.   Goal status: INITIAL   LONG TERM GOALS: Target date: 08/07/21  Patient to be independent with final HEP  Goal status: INITIAL   2.  Patient to demo improved strength of hips and core to at least 4+/5 , to improve stability and pain  Goal  status: INITIAL  3.  Patient to demo ability for bend, lift, squat, with optimal mechanics, to improve ability for IADLs.   Goal status: INITIAL  5.  Pt to report  decreased pain in back and LE to 0-2/10 with standing and walking activity, up to 1 hr.   Goal status: INITIAL     PLAN: PT FREQUENCY: 1-2x/week  PT DURATION: 6 weeks  PLANNED INTERVENTIONS: Therapeutic exercises, Therapeutic activity, Neuromuscular re-education, Balance training, Gait training, Patient/Family education, Joint manipulation, Joint mobilization, Stair training, DME instructions, Dry Needling, Electrical stimulation, Spinal manipulation, Spinal mobilization, Cryotherapy, Moist heat, Taping, Vasopneumatic device, Traction, Ultrasound, Ionotophoresis '4mg'$ /ml Dexamethasone, and Manual therapy.  PLAN FOR NEXT SESSION:   Lyndee Hensen, PT, DPT 9:22 AM  07/19/21

## 2021-07-18 NOTE — Patient Instructions (Addendum)
Good to see you today.  Con't w/ PT.  Please get an Xray today before you leave.  Follow-up: 6 weeks

## 2021-07-18 NOTE — Progress Notes (Signed)
   I, Wendy Poet, LAT, ATC, am serving as scribe for Dr. Lynne Leader.  Jay Combs is a 66 y.o. male who presents to [place] [facility] today for continued low back pain. Pt had a L L5 nerve root block and transforaminal ESI on 01/20/19. Pt was last seen by Dr. Georgina Snell on 04/12/21 and was advised to use a TENS unit and heating pad. Pt sent a MyChart message on 06/01/21 changing his mind and wanting to pursue PT and a referral was placed to [facility]. Pt has completed 3 PT visits. Today, pt reports that his lower back is feeling better but con't to have pain in his R buttock and newer pain in his R groin.  The groin pain has been present for a few days.  He denies any swelling in the area or any radiating pain.  Dx imaging: 04/12/21 L-spine XR  10/04/19 L hip XR 11/12/18 L-spine MRI 11/16/18 L-spine XR  Pertinent review of systems: No fevers or chills.  No bowel or bladder dysfunction. . Relevant historical information: History of prior ankle fracture.   Exam:  BP 102/72 (BP Location: Right Arm, Patient Position: Sitting, Cuff Size: Normal)   Pulse 63   Ht '5\' 6"'$  (1.676 m)   Wt 142 lb (64.4 kg)   SpO2 95%   BMI 22.92 kg/m  General: Well Developed, well nourished, and in no acute distress.   MSK: Lumbar spine: Nontender midline. Normal lumbar motion. Right hip normal-appearing Mildly tender palpation anterior hip. Normal hip motion. Intact strength.  Genital: No hernia present.  Testicles descended.    Lab and Radiology Results  X-ray images right hip obtained today personally and independently interpreted. No acute fractures.  Normal-appearing right hip x-ray. Await formal radiology review    Assessment and Plan: 66 y.o. male with low back pain significantly improved with physical therapy.  Plan to finish out physical therapy. Additionally patient does have some right anterior hip pain.  Etiology is less clear.  Continue PT and home exercise  program and check back in 6 weeks.   PDMP not reviewed this encounter. Orders Placed This Encounter  Procedures   DG HIP UNILAT W OR W/O PELVIS 2-3 VIEWS RIGHT    Standing Status:   Future    Number of Occurrences:   1    Standing Expiration Date:   08/17/2021    Order Specific Question:   Reason for Exam (SYMPTOM  OR DIAGNOSIS REQUIRED)    Answer:   R hip pain    Order Specific Question:   Preferred imaging location?    Answer:   [facility]   No orders of the defined types were placed in this encounter.    Discussed warning signs or symptoms. Please see discharge instructions. Patient expresses understanding.   The above documentation has been reviewed and is accurate and complete Lynne Leader, M.D.

## 2021-07-19 ENCOUNTER — Encounter: Payer: Self-pay | Admitting: Physical Therapy

## 2021-07-22 ENCOUNTER — Encounter: Payer: Self-pay | Admitting: Family Medicine

## 2021-07-22 ENCOUNTER — Encounter: Payer: BC Managed Care – PPO | Admitting: Physical Therapy

## 2021-07-22 NOTE — Progress Notes (Signed)
Right hip x-ray looks normal to radiology

## 2021-07-24 ENCOUNTER — Encounter: Payer: Self-pay | Admitting: Physical Therapy

## 2021-07-24 ENCOUNTER — Ambulatory Visit (INDEPENDENT_AMBULATORY_CARE_PROVIDER_SITE_OTHER): Payer: BC Managed Care – PPO | Admitting: Physical Therapy

## 2021-07-24 DIAGNOSIS — M5459 Other low back pain: Secondary | ICD-10-CM | POA: Diagnosis not present

## 2021-07-24 NOTE — Therapy (Signed)
OUTPATIENT PHYSICAL THERAPY THORACOLUMBAR TREATMENT   Patient Name: Jay Combs MRN: [id number] DOB:30-Jan-1956, 66 y.o., male Today's Date: 07/24/2021   PT End of Session - 07/24/21 1554     Visit Number 4    Number of Visits 12    Date for PT Re-Evaluation 08/07/21    Authorization Type BCBS    PT Start Time 1555    PT Stop Time 8242    PT Time Calculation (min) 43 min    Activity Tolerance Patient tolerated treatment well    Behavior During Therapy [facility] for tasks assessed/performed                Past Medical History:  Diagnosis Date   ALLERGIC RHINITIS 09/25/2006   BRADYCARDIA 09/25/2006   athletic Heart rate   CHEST PAIN UNSPECIFIED 10/28/2007   cardiology evaluation including stress test reassuring   INSOMNIA, CHRONIC 08/09/2008   PALPITATIONS, CHRONIC 10/28/2007   Past Surgical History:  Procedure Laterality Date   COLONOSCOPY  11/09/2008   Stark-normal   COLONOSCOPY  2022   Stark-normal GERD   NASAL SEPTUM SURGERY     Patient Active Problem List   Diagnosis Date Noted   Aortic atherosclerosis ([place]) 04/15/2021   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[place]) 08/20/2020   Sensation of fullness in both ears 10/26/2019   Lumbar radiculopathy 12/15/2018   Closed disp fracture of left lateral malleolus with delayed healing 10/15/2018   Anxiety 08/12/2017   GERD (gastroesophageal reflux disease) 01/12/2017   Macular pucker of both eyes 01/12/2017   Hyperlipidemia 01/12/2017   Ptosis, right 01/12/2017   PALPITATIONS, CHRONIC 10/28/2007    PCP: Garret Reddish  REFERRING PROVIDER: Lynne Leader  REFERRING DIAG: Low back pain   THERAPY DIAG:  Other low back pain  ONSET DATE:   SUBJECTIVE:                                                                                                                                                                                           SUBJECTIVE STATEMENT:  07/24/21  Pt had increased pain over weekend, after walking 4 miles, has  not walked that far in several months. Pain better now.  R anterior hip still sore with certain motions and palpation,. R side of low back still sore, But thinks back is a bit better.    Eval: Pt reports ongoing LBP. He has had Previous injections, helped some, but Still painful Most pain is  stiffness, in AM. He also feels weak in both thighs at times. He is able to exercise some, 3 mi on elliptical, 4 days/wk. Tawnya Crook, sits a lot. Mostly pain  free when sitting.  Notes pain bilaterally, and into R glute,     PERTINENT HISTORY:    PAIN:  Are you having pain? Yes: NPRS scale: 5/10 Pain location: lumbar, into Bil glutes Pain description: stiffness, tightness, pain Aggravating factors: standing, walking, getting up and down  Relieving factors: sitting    PRECAUTIONS: None  WEIGHT BEARING RESTRICTIONS No  FALLS:  Has patient fallen in last 6 months? No  PLOF: Independent  PATIENT GOALS  decreased pain   OBJECTIVE:   DIAGNOSTIC FINDINGS:  Xray 04/13/21:  Multilevel degenerative disc disease relatively mild with small anterior osteophytes. Mild lower lumbar facet degenerative changes identified. Mild calcified atherosclerosis in the abdominal aorta.   COGNITION:  Overall cognitive status: Within functional limits for tasks assessed      POSTURE:  Flexible, poor seated posture,   PALPATION: Tenderness in bil lumbar, minimal pain to palpate today, tightness in bil paraspinals, into R glute   LUMBAR ROM:   Lumbar: WFL,  mild limitation for extension (uncomfortable) Hip ROM: WFL   LE MMT: Hip flex: 4/5, abd : 4-/5 , ext: 4-5/   LUMBAR SPECIAL TESTS:  Neg SLR   TODAY'S TREATMENT   07/24/21: Therapeutic Exercise: Aerobic: Supine: Bridging x 15;  pelvic tilts x 15  S/L hip abd: x10, (pain on inside of thigh bil ) Seated: Standing:  Hip abd x 10 bil; (pain with loading on R)  Stretches: LTR x 15; Hip flexor stretch off side of table 30 sec x 3 on R; fig  4 piriformis 30 sec x 2 on R;  Neuromuscular Re-education: Manual Therapy:  R hip inf mobs, long leg distraction for R hip, lumbar PA mobs, R SI mobs,    Therapeutic Exercise: 07/18/21 Aerobic: Supine: Bridging x 15; pelvic tilts x 10 (review to go easy)  Seated: Standing: Stretches: SKTC 30 sec x 3 bil;  LTR x 15; Hip flexor stretch off side of table 30 sec x 3 on R;   Neuromuscular Re-education: Manual Therapy: DTM/TPR to R glute med, min,  R hip inf mobs, long leg distraction for R hip.    PATIENT EDUCATION:  Education details: reviewed HEP, education on tennis ball DTM for R glute.  Person educated: Patient Education method: Explanation, Demonstration, Tactile cues, Verbal cues, and Handouts Education comprehension: verbalized understanding, returned demonstration, verbal cues required, tactile cues required, and needs further education   HOME EXERCISE PROGRAM: Access Code: [email or web address]    ASSESSMENT:  CLINICAL IMPRESSION: Pt with minimal soreness to palpate lumbar region, but continues to have increased pain here with increased activity and in AM. He has tenderness in R glute but is not generating pain with activity. He has painful spot in groin with palpation, but is able to do resisted hip flexion as well as hip flexor stretch today without pain reproduction. Pt to benefit from continued lumbar mobility for stiffness , and progress strengthening as tolerated.   OBJECTIVE IMPAIRMENTS decreased activity tolerance, decreased mobility, decreased ROM, decreased strength, increased muscle spasms, impaired flexibility, improper body mechanics, and pain.   ACTIVITY LIMITATIONS cleaning, shopping, community activity, and yard work.   PERSONAL FACTORS  none  are also affecting patient's functional outcome.    REHAB POTENTIAL: Good  CLINICAL DECISION MAKING: Stable/uncomplicated  EVALUATION COMPLEXITY: Low   GOALS: Goals reviewed with patient? Yes  SHORT TERM GOALS: Target  date: 07/11/21  Patient to be independent with initial HEP  Goal status: INITIAL  2.  Patient to demo optimal seated posture in  clinic, and report doing posture checks while at work daily.   Goal status: INITIAL   LONG TERM GOALS: Target date: 08/07/21  Patient to be independent with final HEP  Goal status: INITIAL   2.  Patient to demo improved strength of hips and core to at least 4+/5 , to improve stability and pain  Goal status: INITIAL  3.  Patient to demo ability for bend, lift, squat, with optimal mechanics, to improve ability for IADLs.   Goal status: INITIAL  5.  Pt to report decreased pain in back and LE to 0-2/10 with standing and walking activity, up to 1 hr.   Goal status: INITIAL     PLAN: PT FREQUENCY: 1-2x/week  PT DURATION: 6 weeks  PLANNED INTERVENTIONS: Therapeutic exercises, Therapeutic activity, Neuromuscular re-education, Balance training, Gait training, Patient/Family education, Joint manipulation, Joint mobilization, Stair training, DME instructions, Dry Needling, Electrical stimulation, Spinal manipulation, Spinal mobilization, Cryotherapy, Moist heat, Taping, Vasopneumatic device, Traction, Ultrasound, Ionotophoresis '4mg'$ /ml Dexamethasone, and Manual therapy.  PLAN FOR NEXT SESSION:   Lyndee Hensen, PT, DPT 4:40 PM  07/24/21

## 2021-08-07 ENCOUNTER — Encounter: Payer: BC Managed Care – PPO | Admitting: Physical Therapy

## 2021-08-14 ENCOUNTER — Encounter: Payer: Self-pay | Admitting: Family Medicine

## 2021-08-19 ENCOUNTER — Encounter: Payer: Self-pay | Admitting: Family Medicine

## 2021-08-19 ENCOUNTER — Ambulatory Visit (INDEPENDENT_AMBULATORY_CARE_PROVIDER_SITE_OTHER): Payer: BC Managed Care – PPO | Admitting: Physical Therapy

## 2021-08-19 ENCOUNTER — Encounter: Payer: Self-pay | Admitting: Physical Therapy

## 2021-08-19 DIAGNOSIS — M5459 Other low back pain: Secondary | ICD-10-CM

## 2021-08-19 DIAGNOSIS — M545 Low back pain, unspecified: Secondary | ICD-10-CM

## 2021-08-19 NOTE — Therapy (Addendum)
OUTPATIENT PHYSICAL THERAPY THORACOLUMBAR TREATMENT/Re-Cert   Patient Name: Jay Combs MRN: [id number] DOB:1955/10/30, 66 y.o., male Today's Date: 08/19/2021   PT End of Session - 08/19/21 1558     Visit Number 5    Number of Visits 12    Date for PT Re-Evaluation 09/30/21    Authorization Type BCBS    PT Start Time 1559    PT Stop Time 1635    PT Time Calculation (min) 36 min    Activity Tolerance Patient tolerated treatment well    Behavior During Therapy [facility] for tasks assessed/performed                Past Medical History:  Diagnosis Date   ALLERGIC RHINITIS 09/25/2006   BRADYCARDIA 09/25/2006   athletic Heart rate   CHEST PAIN UNSPECIFIED 10/28/2007   cardiology evaluation including stress test reassuring   INSOMNIA, CHRONIC 08/09/2008   PALPITATIONS, CHRONIC 10/28/2007   Past Surgical History:  Procedure Laterality Date   COLONOSCOPY  11/09/2008   Stark-normal   COLONOSCOPY  2022   Stark-normal GERD   NASAL SEPTUM SURGERY     Patient Active Problem List   Diagnosis Date Noted   Aortic atherosclerosis ([place]) 04/15/2021   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[place]) 08/20/2020   Sensation of fullness in both ears 10/26/2019   Lumbar radiculopathy 12/15/2018   Closed disp fracture of left lateral malleolus with delayed healing 10/15/2018   Anxiety 08/12/2017   GERD (gastroesophageal reflux disease) 01/12/2017   Macular pucker of both eyes 01/12/2017   Hyperlipidemia 01/12/2017   Ptosis, right 01/12/2017   PALPITATIONS, CHRONIC 10/28/2007    PCP: Garret Reddish  REFERRING PROVIDER: Lynne Leader  REFERRING DIAG: Low back pain   THERAPY DIAG:  Other low back pain  ONSET DATE:   SUBJECTIVE:                                                                                                                                                                                           SUBJECTIVE STATEMENT:  08/19/21 Pt has been seen for 5 visits. Pt states continued pain.  Most pain in AM, and if he sits for a longer time. He has had minimal pain relief thus far. He has an MRI ordered , hoping to have it done this week.   PERTINENT HISTORY:    PAIN:  Are you having pain? Yes: NPRS scale: 5/10 Pain location: lumbar, into Bil glutes Pain description: stiffness, tightness, pain Aggravating factors: standing, walking, getting up and down  Relieving factors: sitting    PRECAUTIONS: None  WEIGHT BEARING RESTRICTIONS No  FALLS:  Has patient fallen in last  6 months? No  PLOF: Independent  PATIENT GOALS  decreased pain   OBJECTIVE:   DIAGNOSTIC FINDINGS:  Xray 04/13/21:  Multilevel degenerative disc disease relatively mild with small anterior osteophytes. Mild lower lumbar facet degenerative changes identified. Mild calcified atherosclerosis in the abdominal aorta.   COGNITION:  Overall cognitive status: Within functional limits for tasks assessed      POSTURE:  Flexible, poor seated posture,   PALPATION: minimal pain to palpate today,   LUMBAR ROM:   Lumbar: WFL,   Hip ROM: WFL   LE MMT: Hip flex: 4+/5, abd : 4+/5 , ext: 4/5/   LUMBAR SPECIAL TESTS:  Neg SLR   TODAY'S TREATMENT   7/10//23: Therapeutic Exercise: Aerobic: Supine: Bridging x 20;   Supine march x 20 with TA;  Seated: Standing:   Stretches:   Supine : fig 4 piriformis 30 sec x 2 on R;  SKTC 30 sec x 3 bil;  Seated and standing HSS x 2 min ea;  Prayer stretch x2 min, L, R, center;   Neuromuscular Re-education: Manual Therapy:  long leg distraction for bil lumbar;  lumbar PA mobs,    PATIENT EDUCATION:  Education details: reviewed and updated HEP,  discussed POC  Person educated: Patient Education method: Explanation, Demonstration, Tactile cues, Verbal cues, and Handouts Education comprehension: verbalized understanding, returned demonstration, verbal cues required, tactile cues required, and needs further education   HOME EXERCISE PROGRAM: Access Code:  [email or web address]    ASSESSMENT:  CLINICAL IMPRESSION: Pt has been seen for 5 visits. He has continued pain in back, and has had minimal change in pain overall. He has MRI ordered, Will hold at this time, until after MRI results. Discussed importance of continuing HEP, updated and reviewed final HEP. Pt in agreement with plan. Pt may benefit from continued pain in future as needed after f/u with MD. Re-cert done for today, and will extend if pt returns after MRI .  OBJECTIVE IMPAIRMENTS decreased activity tolerance, decreased mobility, decreased ROM, decreased strength, increased muscle spasms, impaired flexibility, improper body mechanics, and pain.   ACTIVITY LIMITATIONS cleaning, shopping, community activity, and yard work.   PERSONAL FACTORS  none  are also affecting patient's functional outcome.    REHAB POTENTIAL: Good  CLINICAL DECISION MAKING: Stable/uncomplicated  EVALUATION COMPLEXITY: Low   GOALS: Goals reviewed with patient? Yes  SHORT TERM GOALS: Target date: 07/11/21  Patient to be independent with initial HEP  Goal status: MET  2.  Patient to demo optimal seated posture in clinic, and report doing posture checks while at work daily.   Goal status: MET   LONG TERM GOALS: Target date: 8/21//23  Patient to be independent with final HEP  Goal status: MET   2.  Patient to demo improved strength of hips and core to at least 4+/5 , to improve stability and pain  Goal status: IN PROGRESS  3.  Patient to demo ability for bend, lift, squat, with optimal mechanics, to improve ability for IADLs.   Goal status: MET  5.  Pt to report decreased pain in back and LE to 0-2/10 with standing and walking activity, up to 1 hr.   Goal status: IN PROGRESS     PLAN: PT FREQUENCY: 1-2x/week  PT DURATION: 6 weeks  PLANNED INTERVENTIONS: Therapeutic exercises, Therapeutic activity, Neuromuscular re-education, Balance training, Gait training, Patient/Family education, Joint  manipulation, Joint mobilization, Stair training, DME instructions, Dry Needling, Electrical stimulation, Spinal manipulation, Spinal mobilization, Cryotherapy, Moist heat, Taping, Vasopneumatic device, Traction, Ultrasound, Ionotophoresis  71m/ml Dexamethasone, and Manual therapy.  PLAN FOR NEXT SESSION:   LLyndee Hensen PT, DPT 4:36 PM  08/19/21     PHYSICAL THERAPY DISCHARGE SUMMARY  Visits from Start of Care: 5 Plan: Patient agrees to discharge.  Patient goals were partially met. Patient is being discharged due to - returning to dr due to ongoing pain.    LLyndee Hensen PT, DPT 9:12 AM  11/26/21

## 2021-08-20 ENCOUNTER — Encounter: Payer: Self-pay | Admitting: Family Medicine

## 2021-08-20 DIAGNOSIS — M545 Low back pain, unspecified: Secondary | ICD-10-CM

## 2021-08-22 ENCOUNTER — Encounter: Payer: BC Managed Care – PPO | Admitting: Physical Therapy

## 2021-08-23 ENCOUNTER — Ambulatory Visit: Payer: BC Managed Care – PPO | Admitting: Family Medicine

## 2021-08-24 ENCOUNTER — Ambulatory Visit (INDEPENDENT_AMBULATORY_CARE_PROVIDER_SITE_OTHER): Payer: BC Managed Care – PPO

## 2021-08-24 DIAGNOSIS — M5126 Other intervertebral disc displacement, lumbar region: Secondary | ICD-10-CM | POA: Diagnosis not present

## 2021-08-24 DIAGNOSIS — M545 Low back pain, unspecified: Secondary | ICD-10-CM | POA: Diagnosis not present

## 2021-08-24 DIAGNOSIS — G8929 Other chronic pain: Secondary | ICD-10-CM | POA: Diagnosis not present

## 2021-08-26 NOTE — Progress Notes (Signed)
Lumbar spine MRI shows a new Schmorl's node at L4 with some bone edema around it.  This is where the disc herniates vertically into the vertebrae instead of forward or backwards pressing on nerves.  This typically will get better on its own but can be painful.  We can try an epidural steroid injection to try to calm the area down if you would like me to.  I can order that now.  Otherwise we can talk about the MRI and potential treatment plan and options when you return on the 25th

## 2021-08-27 ENCOUNTER — Encounter: Payer: Self-pay | Admitting: Family Medicine

## 2021-09-03 ENCOUNTER — Ambulatory Visit: Payer: BC Managed Care – PPO | Admitting: Family Medicine

## 2021-09-03 VITALS — BP 138/80 | HR 77 | Ht 66.0 in | Wt 146.6 lb

## 2021-09-03 DIAGNOSIS — M545 Low back pain, unspecified: Secondary | ICD-10-CM

## 2021-09-03 DIAGNOSIS — G8929 Other chronic pain: Secondary | ICD-10-CM

## 2021-09-03 NOTE — Patient Instructions (Addendum)
Thank you for coming in today.   Let me know if you want me to schedule the back injection.   Keep up the exercises.   Recheck as needed.

## 2021-09-03 NOTE — Progress Notes (Unsigned)
I, Peterson Lombard, LAT, ATC acting as a scribe for Lynne Leader, MD.  Jay Combs is a 66 y.o. male who presents to [place] at [facility] today for f/u low back pain. Pt had a L L5 nerve root block and transforaminal ESI on 01/20/19. Pt was last seen by Dr. Georgina Snell on 07/18/21, the visit focused mostly on his R buttock and R groin pain. Pt has cont PT, completing 5 visits. Pt sent a MyChart message on 08/14/21 reporting increase low back pain, upon Dr. Clovis Riley return to the office, a L-spine MRI was ordered. Today, pt reports maybe slight improvement in LBP.   Pt c/o R-sided neck pain that causes HA that's being going on for the past 2-days ago.   Dx imaging: 08/24/21 L-spine MRI  07/18/21 R hip XR 04/12/21 L-spine XR             10/04/19 L hip XR 11/12/18 L-spine MRI 11/16/18 L-spine XR  Pertinent review of systems: No fevers or chills  Relevant historical information: Aortic atherosclerosis.   Exam:  BP 138/80   Pulse 77   Ht '5\' 6"'$  (1.676 m)   Wt 146 lb 9.6 oz (66.5 kg)   SpO2 94%   BMI 23.66 kg/m  General: Well Developed, well nourished, and in no acute distress.   MSK: L-spine: Nontender midline.  Tender palpation lumbar paraspinal musculatures.  Decreased lumbar motion.    Lab and Radiology Results  EXAM: MRI LUMBAR SPINE WITHOUT CONTRAST   TECHNIQUE: Multiplanar, multisequence MR imaging of the lumbar spine was performed. No intravenous contrast was administered.   COMPARISON:  Lumbar spine MRI 01/12/2019   FINDINGS: Segmentation:  Standard.   Alignment:  Normal.   Vertebrae: No fracture or suspicious marrow lesion. Scattered small vertebral hemangiomas. New moderate-sized Schmorl's node involving the L4 superior endplate with mild surrounding marrow edema.   Conus medullaris and cauda equina: Conus extends to the L1 level. Conus and cauda equina appear normal.   Paraspinal and other soft tissues: Unremarkable.   Disc levels:   Disc  desiccation from L2-3 through L5-S1 with mild disc space narrowing at L2-3 and L3-4 and moderate narrowing at L5-S1.   T11-12: Only imaged sagittally. Disc bulging, small central to left paracentral disc protrusion, and mild facet arthrosis without evidence of significant stenosis, similar to the prior MRI.   T12-L1: Mild left facet arthrosis without disc herniation or stenosis, unchanged.   L1-2: Mild facet hypertrophy without disc herniation or stenosis, unchanged.   L2-3: Disc bulging and mild-to-moderate facet and ligamentum flavum hypertrophy without stenosis, unchanged.   L3-4: Disc bulging and mild facet and ligamentum flavum hypertrophy without stenosis, unchanged.   L4-5: Disc bulging, a small left foraminal to extraforaminal disc protrusion, and moderate facet and ligamentum flavum hypertrophy result in borderline spinal stenosis, mild bilateral lateral recess stenosis, and mild left neural foraminal stenosis, unchanged.   L5-S1: Disc bulging, a small central disc protrusion, and mild facet hypertrophy result in mild-to-moderate right neural foraminal stenosis without spinal stenosis, unchanged.   IMPRESSION: 1. New L4 Schmorl's node with mild edema which could be a source of back pain. 2. Otherwise unchanged lumbar disc and facet degeneration resulting in mild lateral recess and mild-to-moderate neural foraminal stenosis as above.     Electronically Signed   By: Logan Bores M.D.   On: 08/26/2021 11:21 I, Lynne Leader, personally (independently) visualized and performed the interpretation of the images attached in this note.  Assessment and Plan: 66 y.o. male with low back pain.  Pain thought to be primarily due to the Schmorl's node at L4 with bone edema.  He notes his pain has improved a little bit since initial presentation and after discussion would like to do a little bit of watchful waiting before proceeding with injection which is reasonable.  If  needed an epidural steroid injection is a reasonable starting point.  He has other targets for potential injection of back including facet arthritis.  Recheck back as needed.  Total encounter time 30 minutes including face-to-face time with the patient and, reviewing past medical record, and charting on the date of service.   Reviewed the MRI findings and imaging and discuss treatment plan options extensively.     Discussed warning signs or symptoms. Please see discharge instructions. Patient expresses understanding.   The above documentation has been reviewed and is accurate and complete Lynne Leader, M.D.

## 2021-09-05 ENCOUNTER — Ambulatory Visit: Payer: BC Managed Care – PPO | Admitting: Family Medicine

## 2021-09-17 ENCOUNTER — Encounter: Payer: BC Managed Care – PPO | Admitting: Family Medicine

## 2021-09-25 ENCOUNTER — Encounter: Payer: Self-pay | Admitting: Gastroenterology

## 2021-09-26 ENCOUNTER — Other Ambulatory Visit: Payer: Self-pay | Admitting: Family Medicine

## 2021-09-26 ENCOUNTER — Encounter: Payer: Self-pay | Admitting: Family Medicine

## 2021-09-26 ENCOUNTER — Ambulatory Visit (INDEPENDENT_AMBULATORY_CARE_PROVIDER_SITE_OTHER): Payer: BC Managed Care – PPO | Admitting: Family Medicine

## 2021-09-26 VITALS — BP 110/70 | HR 67 | Temp 98.0°F | Ht 66.0 in | Wt 143.0 lb

## 2021-09-26 DIAGNOSIS — E559 Vitamin D deficiency, unspecified: Secondary | ICD-10-CM | POA: Insufficient documentation

## 2021-09-26 DIAGNOSIS — Z125 Encounter for screening for malignant neoplasm of prostate: Secondary | ICD-10-CM | POA: Diagnosis not present

## 2021-09-26 DIAGNOSIS — Z0000 Encounter for general adult medical examination without abnormal findings: Secondary | ICD-10-CM

## 2021-09-26 DIAGNOSIS — Z79899 Other long term (current) drug therapy: Secondary | ICD-10-CM | POA: Diagnosis not present

## 2021-09-26 DIAGNOSIS — I712 Thoracic aortic aneurysm, without rupture, unspecified: Secondary | ICD-10-CM

## 2021-09-26 DIAGNOSIS — E785 Hyperlipidemia, unspecified: Secondary | ICD-10-CM | POA: Diagnosis not present

## 2021-09-26 LAB — COMPREHENSIVE METABOLIC PANEL
ALT: 17 U/L (ref 0–53)
AST: 23 U/L (ref 0–37)
Albumin: 4.1 g/dL (ref 3.5–5.2)
Alkaline Phosphatase: 44 U/L (ref 39–117)
BUN: 14 mg/dL (ref 6–23)
CO2: 27 mEq/L (ref 19–32)
Calcium: 8.7 mg/dL (ref 8.4–10.5)
Chloride: 102 mEq/L (ref 96–112)
Creatinine, Ser: 0.9 mg/dL (ref 0.40–1.50)
GFR: 90.55 mL/min (ref >60.00)
Glucose, Bld: 96 mg/dL (ref 70–99)
Potassium: 3.8 mEq/L (ref 3.5–5.1)
Sodium: 138 mEq/L (ref 135–145)
Total Bilirubin: 0.9 mg/dL (ref 0.2–1.2)
Total Protein: 6.2 g/dL (ref 6.0–8.3)

## 2021-09-26 LAB — URINALYSIS, ROUTINE W REFLEX MICROSCOPIC
Bilirubin Urine: NEGATIVE
Hgb urine dipstick: NEGATIVE
Ketones, ur: NEGATIVE
Leukocytes,Ua: NEGATIVE
Nitrite: NEGATIVE
Specific Gravity, Urine: 1.01 (ref 1.000–1.030)
Total Protein, Urine: NEGATIVE
Urine Glucose: NEGATIVE
Urobilinogen, UA: 0.2 (ref 0.0–1.0)
pH: 7 (ref 5.0–8.0)

## 2021-09-26 LAB — CBC WITH DIFFERENTIAL/PLATELET
Basophils Absolute: 0 10*3/uL (ref 0.0–0.1)
Basophils Relative: 0.9 % (ref 0.0–3.0)
Eosinophils Absolute: 0.1 10*3/uL (ref 0.0–0.7)
Eosinophils Relative: 2.8 % (ref 0.0–5.0)
HCT: 40.4 % (ref 39.0–52.0)
Hemoglobin: 13.4 g/dL (ref 13.0–17.0)
Lymphocytes Relative: 38.6 % (ref 12.0–46.0)
Lymphs Abs: 1.7 10*3/uL (ref 0.7–4.0)
MCHC: 33.3 g/dL (ref 30.0–36.0)
MCV: 91.5 fl (ref 78.0–100.0)
Monocytes Absolute: 0.5 10*3/uL (ref 0.1–1.0)
Monocytes Relative: 10.8 % (ref 3.0–12.0)
Neutro Abs: 2 10*3/uL (ref 1.4–7.7)
Neutrophils Relative %: 46.9 % (ref 43.0–77.0)
Platelets: 185 10*3/uL (ref 150.0–400.0)
RBC: 4.42 Mil/uL (ref 4.22–5.81)
RDW: 13.4 % (ref 11.5–15.5)
WBC: 4.3 10*3/uL (ref 4.0–10.5)

## 2021-09-26 LAB — LIPID PANEL
Cholesterol: 183 mg/dL (ref 0–200)
HDL: 67.7 mg/dL (ref >39.00)
LDL Cholesterol: 105 mg/dL — ABNORMAL HIGH (ref 0–99)
NonHDL: 115.36
Total CHOL/HDL Ratio: 3
Triglycerides: 52 mg/dL (ref 0.0–149.0)
VLDL: 10.4 mg/dL (ref 0.0–40.0)

## 2021-09-26 LAB — PSA: PSA: 1.19 ng/mL (ref 0.10–4.00)

## 2021-09-26 LAB — VITAMIN D 25 HYDROXY (VIT D DEFICIENCY, FRACTURES): VITD: 21.87 ng/mL — ABNORMAL LOW (ref 30.00–100.00)

## 2021-09-26 LAB — TSH: TSH: 1.23 u[IU]/mL (ref 0.35–5.50)

## 2021-09-26 LAB — VITAMIN B12: Vitamin B-12: 459 pg/mL (ref 211–911)

## 2021-09-26 MED ORDER — VITAMIN D (ERGOCALCIFEROL) 1.25 MG (50000 UNIT) PO CAPS: 50000.0000 [IU] | ORAL_CAPSULE | ORAL | 0 refills | Status: DC

## 2021-09-26 NOTE — Patient Instructions (Addendum)
Flu shot- we should have these available within a month or two but please let us know if you get at outside pharmacy -consider covid shot in October when new relesaed  Please stop by lab before you go If you have mychart- we will send your results within 3 business days of [place] receiving them.  If you do not have mychart- we will call you about results within 5 business days of [place] receiving them.  *please also note that you will see labs on mychart as soon as they post. I will later go in and write notes on them- will say "notes from Dr. [name]"   Recommended follow up: Return in about 1 year (around 09/27/2022) for physical or sooner if needed.Schedule b4 you leave.
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
## 2021-10-01 DIAGNOSIS — H25812 Combined forms of age-related cataract, left eye: Secondary | ICD-10-CM | POA: Diagnosis not present

## 2021-10-08 ENCOUNTER — Encounter: Payer: Self-pay | Admitting: Family Medicine

## 2021-11-04 ENCOUNTER — Encounter: Payer: Self-pay | Admitting: *Deleted

## 2021-12-23 ENCOUNTER — Encounter: Payer: Self-pay | Admitting: Family Medicine

## 2021-12-24 ENCOUNTER — Other Ambulatory Visit: Payer: Self-pay

## 2021-12-24 DIAGNOSIS — M545 Low back pain, unspecified: Secondary | ICD-10-CM

## 2021-12-26 ENCOUNTER — Ambulatory Visit: Payer: BC Managed Care – PPO | Admitting: Surgery

## 2021-12-26 ENCOUNTER — Encounter: Payer: Self-pay | Admitting: Surgery

## 2021-12-26 DIAGNOSIS — M5459 Other low back pain: Secondary | ICD-10-CM

## 2021-12-26 DIAGNOSIS — M5416 Radiculopathy, lumbar region: Secondary | ICD-10-CM
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
## 2021-12-30 ENCOUNTER — Telehealth: Payer: Self-pay | Admitting: Physical Medicine and Rehabilitation

## 2021-12-30 NOTE — Telephone Encounter (Signed)
Pt called to set an referral appt with Dr Ernestina Patches. Please call pt at [phone].

## 2021-12-30 NOTE — Telephone Encounter (Signed)
scheduled

## 2022-01-15 ENCOUNTER — Encounter: Payer: BC Managed Care – PPO | Admitting: Physical Medicine and Rehabilitation

## 2022-01-20 ENCOUNTER — Ambulatory Visit: Payer: Self-pay

## 2022-01-20 ENCOUNTER — Ambulatory Visit: Payer: BC Managed Care – PPO | Admitting: Physical Medicine and Rehabilitation

## 2022-01-20 VITALS — BP 110/75 | HR 59

## 2022-01-20 DIAGNOSIS — M47816 Spondylosis without myelopathy or radiculopathy, lumbar region: Secondary | ICD-10-CM | POA: Diagnosis not present

## 2022-01-20 MED ORDER — BUPIVACAINE HCL 0.5 % IJ SOLN
3.0000 mL | Freq: Once | INTRAMUSCULAR | Status: AC
  Administered 2022-01-20: 3 mL

## 2022-01-20 NOTE — Progress Notes (Signed)
Numeric Pain Rating Scale and Functional Assessment Average Pain 5   In the last MONTH (on 0-10 scale) has pain interfered with the following?  1. General activity like being  able to carry out your everyday physical activities such as walking, climbing stairs, carrying groceries, or moving a chair?  Rating(0)   +Driver, -BT, -Dye Allergies.  Lower back pain on both sides, but worse on right. Stiffness on both sides, worse in the mornings. Sitting and lying down makes pain worse

## 2022-01-20 NOTE — Patient Instructions (Signed)

## 2022-01-21 ENCOUNTER — Encounter: Payer: Self-pay | Admitting: Physical Medicine and Rehabilitation

## 2022-01-21 ENCOUNTER — Other Ambulatory Visit: Payer: Self-pay | Admitting: Physical Medicine and Rehabilitation

## 2022-01-23 ENCOUNTER — Ambulatory Visit: Payer: BC Managed Care – PPO | Admitting: Orthopedic Surgery

## 2022-01-26 NOTE — Progress Notes (Signed)
Jay Combs - 66 y.o. male MRN [id number]  Date of birth: January 21, 1956  Office Visit Note: Visit Date: 01/20/2022 PCP: [name], MD Referred by: [name], MD  Subjective: Chief Complaint  Patient presents with   Lower Back - Pain   HPI:  Jay Combs is a 66 y.o. male who comes in today at the request of Benjiman Core, PA-C for planned Bilateral  L4-5 Lumbar facet/ block with fluoroscopic guidance.  The patient has failed conservative care including home exercise, medications, time and activity modification.  This injection will be diagnostic and hopefully therapeutic.  Please see requesting physician notes for further details and justification.  Exam has shown concordant pain with facet joint loading.  He has follow-up with Dr. Ileene Rubens in our office for spine consultation.  If the patient does well with the injection today but it does not last long we could look at second set of diagnostic injections and radiofrequency ablation.   ROS Otherwise per HPI.  Assessment & Plan: Visit Diagnoses:    ICD-10-CM   1. Spondylosis without myelopathy or radiculopathy, lumbar region  M47.816 XR C-ARM NO REPORT    Facet Injection    bupivacaine (MARCAINE) 0.5 % (with pres) injection 3 mL      Plan: No additional findings.   Meds & Orders:  Meds ordered this encounter  Medications   bupivacaine (MARCAINE) 0.5 % (with pres) injection 3 mL    Orders Placed This Encounter  Procedures   Facet Injection   XR C-ARM NO REPORT    Follow-up: Return for Review Pain Diary.   Procedures: No procedures performed  Lumbar Facet Joint Intra-Articular Injection(s) with Fluoroscopic Guidance  Patient: Jay Combs      Date of Birth: 1955/03/21 MRN: [id number] PCP: [name], MD      Visit Date: 01/20/2022   Universal Protocol:    Date/Time: 01/20/2022  Consent Given By: the patient  Position: PRONE   Additional Comments: Vital signs were monitored before and  after the procedure. Patient was prepped and draped in the usual sterile fashion. The correct patient, procedure, and site was verified.   Injection Procedure Details:  Procedure Site One Meds Administered:  Meds ordered this encounter  Medications   bupivacaine (MARCAINE) 0.5 % (with pres) injection 3 mL     Laterality: Bilateral  Location/Site:  L4-L5  Needle size: 22 guage  Needle type: Spinal  Needle Placement: Articular  Findings:  -Comments: Excellent flow of contrast producing a partial arthrogram.  Procedure Details: The fluoroscope beam is vertically oriented in AP, and the inferior recess is visualized beneath the lower pole of the inferior apophyseal process, which represents the target point for needle insertion. When direct visualization is difficult the target point is located at the medial projection of the vertebral pedicle. The region overlying each aforementioned target is locally anesthetized with a 1 to 2 ml. volume of 1% Lidocaine without Epinephrine.   The spinal needle was inserted into each of the above mentioned facet joints using biplanar fluoroscopic guidance. A 0.25 to 0.5 ml. volume of Isovue-250 was injected and a partial facet joint arthrogram was obtained. A single spot film was obtained of the resulting arthrogram.    One to 1.25 ml of the steroid/anesthetic solution was then injected into each of the facet joints noted above.   Additional Comments:  No complications occurred Dressing: 2 x 2 sterile gauze and Band-Aid    Post-procedure details: Patient was observed during the  procedure. Post-procedure instructions were reviewed.  Patient left the clinic in stable condition.    Clinical History: RI LUMBAR SPINE WITHOUT CONTRAST   TECHNIQUE: Multiplanar, multisequence MR imaging of the lumbar spine was performed. No intravenous contrast was administered.   COMPARISON:  Lumbar spine MRI 01/12/2019   FINDINGS: Segmentation:   Standard.   Alignment:  Normal.   Vertebrae: No fracture or suspicious marrow lesion. Scattered small vertebral hemangiomas. New moderate-sized Schmorl's node involving the L4 superior endplate with mild surrounding marrow edema.   Conus medullaris and cauda equina: Conus extends to the L1 level. Conus and cauda equina appear normal.   Paraspinal and other soft tissues: Unremarkable.   Disc levels:   Disc desiccation from L2-3 through L5-S1 with mild disc space narrowing at L2-3 and L3-4 and moderate narrowing at L5-S1.   T11-12: Only imaged sagittally. Disc bulging, small central to left paracentral disc protrusion, and mild facet arthrosis without evidence of significant stenosis, similar to the prior MRI.   T12-L1: Mild left facet arthrosis without disc herniation or stenosis, unchanged.   L1-2: Mild facet hypertrophy without disc herniation or stenosis, unchanged.   L2-3: Disc bulging and mild-to-moderate facet and ligamentum flavum hypertrophy without stenosis, unchanged.   L3-4: Disc bulging and mild facet and ligamentum flavum hypertrophy without stenosis, unchanged.   L4-5: Disc bulging, a small left foraminal to extraforaminal disc protrusion, and moderate facet and ligamentum flavum hypertrophy result in borderline spinal stenosis, mild bilateral lateral recess stenosis, and mild left neural foraminal stenosis, unchanged.   L5-S1: Disc bulging, a small central disc protrusion, and mild facet hypertrophy result in mild-to-moderate right neural foraminal stenosis without spinal stenosis, unchanged.   IMPRESSION: 1. New L4 Schmorl's node with mild edema which could be a source of back pain. 2. Otherwise unchanged lumbar disc and facet degeneration resulting in mild lateral recess and mild-to-moderate neural foraminal stenosis as above.     Electronically Signed   By: Logan Bores M.D.   On: 08/26/2021 11:21     Objective:  VS:  HT:    WT:   BMI:      BP:110/75  HR:(!) 59bpm  TEMP: ( )  RESP:  Physical Exam Vitals and nursing note reviewed.  Constitutional:      General: He is not in acute distress.    Appearance: Normal appearance. He is not ill-appearing.  HENT:     Head: Normocephalic and atraumatic.     Right Ear: External ear normal.     Left Ear: External ear normal.     Nose: No congestion.  Eyes:     Extraocular Movements: Extraocular movements intact.  Cardiovascular:     Rate and Rhythm: Normal rate.     Pulses: Normal pulses.  Pulmonary:     Effort: Pulmonary effort is normal. No respiratory distress.  Abdominal:     General: There is no distension.     Palpations: Abdomen is soft.  Musculoskeletal:        General: No tenderness or signs of injury.     Cervical back: Neck supple.     Right lower leg: No edema.     Left lower leg: No edema.     Comments: Patient has good distal strength without clonus. Patient somewhat slow to rise from a seated position to full extension.  There is concordant low back pain with facet loading and lumbar spine extension rotation.  There are no definitive trigger points but the patient is somewhat tender across the  lower back and PSIS.  There is no pain with hip rotation.   Skin:    Findings: No erythema or rash.  Neurological:     General: No focal deficit present.     Mental Status: He is alert and oriented to person, place, and time.     Sensory: No sensory deficit.     Motor: No weakness or abnormal muscle tone.     Coordination: Coordination normal.  Psychiatric:        Mood and Affect: Mood normal.        Behavior: Behavior normal.      Imaging: No results found.

## 2022-01-26 NOTE — Procedures (Signed)
Lumbar Facet Joint Intra-Articular Injection(s) with Fluoroscopic Guidance  Patient: Jay Combs      Date of Birth: [birth year] MRN: [id number] PCP: [name], MD      Visit Date: 01/20/2022   Universal Protocol:    Date/Time: 01/20/2022  Consent Given By: the patient  Position: PRONE   Additional Comments: Vital signs were monitored before and after the procedure. Patient was prepped and draped in the usual sterile fashion. The correct patient, procedure, and site was verified.   Injection Procedure Details:  Procedure Site One Meds Administered:  Meds ordered this encounter  Medications   bupivacaine (MARCAINE) 0.5 % (with pres) injection 3 mL     Laterality: Bilateral  Location/Site:  L4-L5  Needle size: 22 guage  Needle type: Spinal  Needle Placement: Articular  Findings:  -Comments: Excellent flow of contrast producing a partial arthrogram.  Procedure Details: The fluoroscope beam is vertically oriented in AP, and the inferior recess is visualized beneath the lower pole of the inferior apophyseal process, which represents the target point for needle insertion. When direct visualization is difficult the target point is located at the medial projection of the vertebral pedicle. The region overlying each aforementioned target is locally anesthetized with a 1 to 2 ml. volume of 1% Lidocaine without Epinephrine.   The spinal needle was inserted into each of the above mentioned facet joints using biplanar fluoroscopic guidance. A 0.25 to 0.5 ml. volume of Isovue-250 was injected and a partial facet joint arthrogram was obtained. A single spot film was obtained of the resulting arthrogram.    One to 1.25 ml of the steroid/anesthetic solution was then injected into each of the facet joints noted above.   Additional Comments:  No complications occurred Dressing: 2 x 2 sterile gauze and Band-Aid    Post-procedure details: Patient was observed during the  procedure. Post-procedure instructions were reviewed.  Patient left the clinic in stable condition.

## 2022-02-06 ENCOUNTER — Ambulatory Visit: Payer: BC Managed Care – PPO | Admitting: Orthopedic Surgery

## 2022-02-06 ENCOUNTER — Encounter: Payer: Self-pay | Admitting: Orthopedic Surgery

## 2022-02-06 ENCOUNTER — Ambulatory Visit (INDEPENDENT_AMBULATORY_CARE_PROVIDER_SITE_OTHER): Payer: BC Managed Care – PPO

## 2022-02-06 VITALS — BP 111/77 | HR 91 | Ht 66.0 in | Wt 145.0 lb

## 2022-02-06 DIAGNOSIS — M5416 Radiculopathy, lumbar region: Secondary | ICD-10-CM

## 2022-02-06 DIAGNOSIS — M47816 Spondylosis without myelopathy or radiculopathy, lumbar region: Secondary | ICD-10-CM

## 2022-02-06 DIAGNOSIS — M5459 Other low back pain: Secondary | ICD-10-CM

## 2022-02-06 NOTE — Progress Notes (Signed)
Orthopedic Spine Surgery Office Note  Assessment: Patient is a 66 y.o. male with low back pain with no radicular symptoms.  Received L4-5 bilateral facet injections with 95% relief.  The L4-5 facets seem to be the etiology of his pain   Plan: -Since he has gotten relief from facet injections, recommended repeat injection should he develop worsening pain again.  Eventually, could consider RFA with Dr. Ernestina Patches -Recommended core strengthening -Patient has tried activity modification, PT, facet steroid injection  -Patient should return to office on an as-needed basis   Patient expressed understanding of the plan and all questions were answered to the patient's satisfaction.   ___________________________________________________________________________   History:  Patient is a 66 y.o. male who presents today for lumbar spine.  Patient noted onset of low back pain starting in March 2023.  There is no trauma or injury that brought the pain.  Pain is felt in his lower back.  It does not radiate into either leg.  Pain is worse with activity and does improve with rest.  He notices a lot of stiffness in the back in the mornings.  Has not had significant back pain previously.  Had an injection to the L4-5 facet with Dr. Ernestina Patches and got significant relief.  Reports about 95% relief.  Still has nagging pain in his low back.   Weakness: Yes, low back feels weaker.  No other weakness noted Symptoms of imbalance: Denies Paresthesias and numbness: Denies Bowel or bladder incontinence: Denies Saddle anesthesia: Denies  Treatments tried:  tried activity modification, PT, facet steroid injection   Review of systems: Denies fevers and chills, night sweats, unexplained weight loss, history of cancer, pain that wakes them at night  Past medical history: GERD Allergic rhinitis Insomnia  Allergies: demerol, protonix  Past surgical history:  Nasal septum surgery  Social history: Denies use of nicotine  product (smoking, vaping, patches, smokeless) Alcohol use: Yes, 1 drink per night roughly Denies recreational drug use   Physical Exam:  General: no acute distress, appears stated age Neurologic: alert, answering questions appropriately, following commands Respiratory: unlabored breathing on room air, symmetric chest rise Psychiatric: appropriate affect, normal cadence to speech   MSK (spine):  -Strength exam      Left  Right EHL    5/5  5/5 TA    5/5  5/5 GSC    5/5  5/5 Knee extension  5/5  5/5 Hip flexion   5/5  5/5  -Sensory exam    Sensation intact to light touch in L3-S1 nerve distributions of bilateral lower extremities  -Achilles DTR: 2/4 on the left, 2/4 on the right -Patellar tendon DTR: 2/4 on the left, 2/4 on the right  -Straight leg raise: negative -Contralateral straight leg raise: negative -Clonus: no beats bilaterally  -Left hip exam: No pain to range of motion, negative Stinchfield -Right hip exam: No pain through range of motion, negative Stinchfield  Imaging: XR of the lumbar spine from 02/06/2022 was independently reviewed and interpreted, showing no fracture or dislocation. Vacuum disc phenomenon at L5/S1. Disc height loss at L5/S1. No evidence of instability on flexion/extension views.   MRI of the lumbar spine from 08/24/2021 was independently reviewed and interpreted, showing DDD at L2/3 and L5/S1. Lateral recess stenosis at L4/5. Foraminal stenosis at L5/S1, worse on the right. T2 signal within the left L4/5 facet. Facet arthropathy at L4/5.    Patient name: Jay Combs Patient MRN: [id number] Date of visit: 02/06/22

## 2022-02-07 DIAGNOSIS — R52 Pain, unspecified: Secondary | ICD-10-CM | POA: Diagnosis not present

## 2022-02-07 DIAGNOSIS — R509 Fever, unspecified: Secondary | ICD-10-CM | POA: Diagnosis not present

## 2022-02-07 DIAGNOSIS — R0981 Nasal congestion: Secondary | ICD-10-CM | POA: Diagnosis not present

## 2022-02-07 DIAGNOSIS — U071 COVID-19: Secondary | ICD-10-CM | POA: Diagnosis not present

## 2022-02-10 ENCOUNTER — Encounter: Payer: Self-pay | Admitting: Family Medicine

## 2022-04-10 ENCOUNTER — Ambulatory Visit: Payer: BC Managed Care – PPO | Admitting: Family Medicine

## 2022-04-17 ENCOUNTER — Encounter: Payer: Self-pay | Admitting: Orthopedic Surgery

## 2022-04-17 DIAGNOSIS — M47816 Spondylosis without myelopathy or radiculopathy, lumbar region: Secondary | ICD-10-CM

## 2022-04-25 ENCOUNTER — Telehealth: Payer: Self-pay | Admitting: Physical Medicine and Rehabilitation

## 2022-04-25 NOTE — Telephone Encounter (Signed)
Spoke with patient and he stated he feels better and he wants to hold off for right now and will call back if he needs to

## 2022-04-25 NOTE — Telephone Encounter (Signed)
Patient returning a call to schedule with Dr. Newton.  

## 2022-05-21 ENCOUNTER — Encounter: Payer: Self-pay | Admitting: Gastroenterology

## 2022-05-29 ENCOUNTER — Encounter: Payer: Self-pay | Admitting: Gastroenterology

## 2022-05-29 ENCOUNTER — Ambulatory Visit: Payer: BC Managed Care – PPO | Admitting: Gastroenterology

## 2022-05-29 VITALS — BP 98/72 | HR 49 | Ht 66.0 in | Wt 144.0 lb

## 2022-05-29 DIAGNOSIS — R142 Eructation: Secondary | ICD-10-CM

## 2022-05-29 DIAGNOSIS — K2100 Gastro-esophageal reflux disease with esophagitis, without bleeding: Secondary | ICD-10-CM | POA: Diagnosis not present

## 2022-05-29 NOTE — Patient Instructions (Addendum)
Start taking your omeprazole 20 mg twice daily.   If this does not improve your symptoms then call our office in 3-4 weeks with an update.   The [facility] providers would like to encourage you to use [facility] to communicate with providers for non-urgent requests or questions.  Due to long hold times on the telephone, sending your provider a message by [facility] may be a faster and more efficient way to get a response.  Please allow 48 business hours for a response.  Please remember that this is for non-urgent requests.  Thank you for choosing me and [facility].  Venita Lick. Pleas Koch., MD., Clementeen Graham

## 2022-05-29 NOTE — Progress Notes (Signed)
    Assessment     GERD, LA Grade B esophagitis with worsening evening and morning reflux symptoms Personal history of adenomatous colon polyps   Recommendations    Change omeprazole to 20 mg bid before breakfast and dinner.  If symptoms are not adequately controlled over the next 3-4 weeks he is advised to contact us.  Will then consider adding famotidine 40 mg hs or changing to omeprazole to 40 mg bid before meals. Surveillance colonoscopy recommended in March 2029 REV in 1 year   HPI    This is a 67 year old male with GERD, LA Grade B esophagitis with increased reflux symptoms.  He has been maintained on omeprazole 40 mg every morning and recently decreased to 20 mg every morning.  He notes mild reflux symptoms in the evening, frequent belching in the morning and reflux symptoms when exercising in the morning.  Denies dysphagia, odynophagia, weight loss, abdominal pain, chest pain, sore throat or vocal changes.   Labs / Imaging       Latest Ref Rng & Units 09/26/2021    8:45 AM 10/04/2020    8:40 AM 05/18/2020    1:45 PM  Hepatic Function  Total Protein 6.0 - 8.3 g/dL 6.2  6.9  6.7   Albumin 3.5 - 5.2 g/dL 4.1  4.2  4.3   AST 0 - 37 U/L ALT 0 - 53 U/L Alk Phosphatase 39 - 117 U/L 44  47  43   Total Bilirubin 0.2 - 1.2 mg/dL 0.9  0.7  0.9        Latest Ref Rng & Units 09/26/2021    8:45 AM 10/04/2020    8:40 AM 05/18/2020    1:45 PM  CBC  WBC 4.0 - 10.5 K/uL 4.3  5.5  6.7   Hemoglobin 13.0 - 17.0 g/dL 82.9  56.2  13.0   Hematocrit 39.0 - 52.0 % 40.4  40.3  41.0   Platelets 150.0 - 400.0 K/uL 185.0  195.0  216.0    Current Medications, Allergies, Past Medical History, Past Surgical History, Family History and Social History were reviewed in [employer] record.   Physical Exam: General: Well developed, well nourished, no acute distress Head: Normocephalic and atraumatic Eyes: Sclerae anicteric, EOMI Ears: Normal auditory  acuity Mouth: No deformities or lesions noted Lungs: Clear throughout to auscultation Heart: Regular rate and rhythm; No murmurs, rubs or bruits Abdomen: Soft, non tender and non distended. No masses, hepatosplenomegaly or hernias noted. Normal Bowel sounds Rectal: Not done Musculoskeletal: Symmetrical with no gross deformities  Pulses:  Normal pulses noted Extremities: No edema or deformities noted Neurological: Alert oriented x 4, grossly nonfocal Psychological:  Alert and cooperative. Normal mood and affect   Michaelah Credeur T. Russella Dar, MD 05/29/2022, 10:31 AM

## 2022-06-19 ENCOUNTER — Ambulatory Visit (INDEPENDENT_AMBULATORY_CARE_PROVIDER_SITE_OTHER): Payer: BC Managed Care – PPO | Admitting: Family Medicine

## 2022-06-19 VITALS — BP 99/65 | HR 82 | Temp 98.0°F | Ht 66.0 in | Wt 145.4 lb

## 2022-06-19 DIAGNOSIS — H6991 Unspecified Eustachian tube disorder, right ear: Secondary | ICD-10-CM | POA: Diagnosis not present

## 2022-06-19 NOTE — Progress Notes (Signed)
   Jay Combs is a 67 y.o. male who presents today for an office visit.  Assessment/Plan:  Eustachian tube dysfunction No red flags.  Evidence of eustachian tube dysfunction on exam today.  Recommend starting Flonase nasal spray.  Also recommended auto insufflation 20-30 times daily for the next several days.  He will let us know if not improving in the next couple of weeks and we can refer to ENT if needed.   Subjective:  HPI:  Here with fullness in right ear.  Some discomfort but no pain. No hearing loss. Tried drops without much improvement. No congestion or runny nose.  No fevers or chills. No recent illnesses.        Objective:  Physical Exam: BP 99/65   Pulse 82   Temp 98 F (36.7 C) (Temporal)   Ht 5\' 6"  (1.676 m)   Wt 145 lb 6.4 oz (66 kg)   SpO2 96%   BMI 23.47 kg/m   Gen: No acute distress, resting comfortably HEENT: Bilateral EAC clear. Right tympanic membrane retracted.  Minimal effusion.  OP clear.  Nasal mucosa clear. CV: Regular rate and rhythm with no murmurs appreciated Pulm: Normal work of breathing, clear to auscultation bilaterally with no crackles, wheezes, or rhonchi Neuro: Grossly normal, moves all extremities Psych: Normal affect and thought content      Jay Combs M. Jimmey Ralph, MD 06/19/2022 8:00 AM

## 2022-06-19 NOTE — Patient Instructions (Addendum)
It was very nice to see you today!  You have eustachian tube dysfunction   You can try using Flonase to see if this helps.  Also try gently popping your ears 20-30 times per day.  Return if symptoms worsen or fail to improve.   Take care, Dr Jimmey Ralph  PLEASE NOTE:  If you had any lab tests, please let us know if you have not heard back within a few days. You may see your results on mychart before we have a chance to review them but we will give you a call once they are reviewed by [place].   If we ordered any referrals today, please let us know if you have not heard from their office within the next week.   If you had any urgent prescriptions sent in today, please check with the pharmacy within an hour of our visit to make sure the prescription was transmitted appropriately.   Please try these tips to maintain a healthy lifestyle:  Eat at least 3 REAL meals and 1-2 snacks per day.  Aim for no more than 5 hours between eating.  If you eat breakfast, please do so within one hour of getting up.   Each meal should contain half fruits/vegetables, one quarter protein, and one quarter carbs (no bigger than a computer mouse)  Cut down on sweet beverages. This includes juice, soda, and sweet tea.   Drink at least 1 glass of water with each meal and aim for at least 8 glasses per day  Exercise at least 150 minutes every week.   Eustachian Tube Dysfunction  Eustachian tube dysfunction refers to a condition in which a blockage develops in the narrow passage that connects the middle ear to the back of the nose (eustachian tube). The eustachian tube regulates air pressure in the middle ear by letting air move between the ear and nose. It also helps to drain fluid from the middle ear space. Eustachian tube dysfunction can affect one or both ears. When the eustachian tube does not function properly, air pressure, fluid, or both can build up in the middle ear. What are the causes? This condition occurs  when the eustachian tube becomes blocked or cannot open normally. Common causes of this condition include: Ear infections. Colds and other infections that affect the nose, mouth, and throat (upper respiratory tract). Allergies. Irritation from cigarette smoke. Irritation from stomach acid coming up into the esophagus (gastroesophageal reflux). The esophagus is the part of the body that moves food from the mouth to the stomach. Sudden changes in air pressure, such as from descending in an airplane or scuba diving. Abnormal growths in the nose or throat, such as: Growths that line the nose (nasal polyps). Abnormal growth of cells (tumors). Enlarged tissue at the back of the throat (adenoids). What increases the risk? You are more likely to develop this condition if: You smoke. You are overweight. You are a child who has: Certain birth defects of the mouth, such as cleft palate. Large tonsils or adenoids. What are the signs or symptoms? Common symptoms of this condition include: A feeling of fullness in the ear. Ear pain. Clicking or popping noises in the ear. Ringing in the ear (tinnitus). Hearing loss. Loss of balance. Dizziness. Symptoms may get worse when the air pressure around you changes, such as when you travel to an area of high elevation, fly on an airplane, or go scuba diving. How is this diagnosed? This condition may be diagnosed based on: Your symptoms.  A physical exam of your ears, nose, and throat. Tests, such as those that measure: The movement of your eardrum. Your hearing (audiometry). How is this treated? Treatment depends on the cause and severity of your condition. In mild cases, you may relieve your symptoms by moving air into your ears. This is called "popping the ears." In more severe cases, or if you have symptoms of fluid in your ears, treatment may include: Medicines to relieve congestion (decongestants). Medicines that treat allergies  (antihistamines). Nasal sprays or ear drops that contain medicines that reduce swelling (steroids). A procedure to drain the fluid in your eardrum. In this procedure, a small tube may be placed in the eardrum to: Drain the fluid. Restore the air in the middle ear space. A procedure to insert a balloon device through the nose to inflate the opening of the eustachian tube (balloon dilation). Follow these instructions at home: Lifestyle Do not do any of the following until your health care provider approves: Travel to high altitudes. Fly in airplanes. Work in a Estate agent or room. Scuba dive. Do not use any products that contain nicotine or tobacco. These products include cigarettes, chewing tobacco, and vaping devices, such as e-cigarettes. If you need help quitting, ask your health care provider. Keep your ears dry. Wear fitted earplugs during showering and bathing. Dry your ears completely after. General instructions Take over-the-counter and prescription medicines only as told by your health care provider. Use techniques to help pop your ears as recommended by your health care provider. These may include: Chewing gum. Yawning. Frequent, forceful swallowing. Closing your mouth, holding your nose closed, and gently blowing as if you are trying to blow air out of your nose. Keep all follow-up visits. This is important. Contact a health care provider if: Your symptoms do not go away after treatment. Your symptoms come back after treatment. You are unable to pop your ears. You have: A fever. Pain in your ear. Pain in your head or neck. Fluid draining from your ear. Your hearing suddenly changes. You become very dizzy. You lose your balance. Get help right away if: You have a sudden, severe increase in any of your symptoms. Summary Eustachian tube dysfunction refers to a condition in which a blockage develops in the eustachian tube. It can be caused by ear infections,  allergies, inhaled irritants, or abnormal growths in the nose or throat. Symptoms may include ear pain or fullness, hearing loss, or ringing in the ears. Mild cases are treated with techniques to unblock the ears, such as yawning or chewing gum. More severe cases are treated with medicines or procedures. This information is not intended to replace advice given to you by your health care provider. Make sure you discuss any questions you have with your health care provider. Document Revised: 04/09/2020 Document Reviewed: 04/09/2020 Elsevier Patient Education  2023 [employer].

## 2022-06-27 DIAGNOSIS — H9201 Otalgia, right ear: Secondary | ICD-10-CM | POA: Diagnosis not present

## 2022-07-08 ENCOUNTER — Encounter: Payer: Self-pay | Admitting: Gastroenterology

## 2022-07-08 NOTE — Telephone Encounter (Signed)
Dr Russella Dar see note sent to the pt. Do you want to add Pepcid?

## 2022-07-09 ENCOUNTER — Ambulatory Visit: Payer: BC Managed Care – PPO | Admitting: Gastroenterology

## 2022-08-19 DIAGNOSIS — H2513 Age-related nuclear cataract, bilateral: Secondary | ICD-10-CM | POA: Diagnosis not present

## 2022-08-19 DIAGNOSIS — H25013 Cortical age-related cataract, bilateral: Secondary | ICD-10-CM | POA: Diagnosis not present

## 2022-08-19 DIAGNOSIS — H25043 Posterior subcapsular polar age-related cataract, bilateral: Secondary | ICD-10-CM | POA: Diagnosis not present

## 2022-08-19 DIAGNOSIS — H18413 Arcus senilis, bilateral: Secondary | ICD-10-CM | POA: Diagnosis not present

## 2022-08-19 DIAGNOSIS — H2512 Age-related nuclear cataract, left eye: Secondary | ICD-10-CM | POA: Diagnosis not present

## 2022-09-23 ENCOUNTER — Encounter: Payer: Self-pay | Admitting: Family Medicine

## 2022-09-23 ENCOUNTER — Ambulatory Visit (INDEPENDENT_AMBULATORY_CARE_PROVIDER_SITE_OTHER): Payer: Medicare HMO | Admitting: Family Medicine

## 2022-09-23 VITALS — BP 111/77 | HR 62 | Temp 98.3°F | Resp 16 | Ht 66.0 in | Wt 142.5 lb

## 2022-09-23 DIAGNOSIS — U071 COVID-19: Secondary | ICD-10-CM | POA: Diagnosis not present

## 2022-09-23 DIAGNOSIS — R0609 Other forms of dyspnea: Secondary | ICD-10-CM

## 2022-09-23 NOTE — Progress Notes (Signed)
Subjective:     Patient ID: Jay Combs, male    DOB: Nov 24, 1955, 67 y.o.   MRN: [id number]  Chief Complaint  Patient presents with   Shortness of Breath    Noticed he is getting SOB after doing 2 laps of swimming since have Covid 2 weeks ago    HPI  Shortness of breath - He reports shortness of breath after doing his 2 laps of swimming, since having Covid 2 weeks ago. He states he has difficulty catching his breath, which is unusual for him. He picked up swimming at the [facility] about 2 months ago, typically did 2 laps in the pool with ease 3-4 days/week. He is still able to walk with no limitations, walked 4 mi on the elliptical this morning and breathing was fine. He has not swam in 2 days, last time was Saturday. When he had Covid, he didn't swim for about a week. Denies any dizziness/lightheadedness, fever, chills, cough, abd tenderness, or LE edema. No hx of MI. 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   athletic Heart rate   CHEST PAIN UNSPECIFIED 10/28/2007   cardiology evaluation including stress test reassuring   INSOMNIA, CHRONIC 08/09/2008   PALPITATIONS, CHRONIC 10/28/2007    Past Surgical History:  Procedure Laterality Date   COLONOSCOPY  11/09/2008   [facility]-normal   COLONOSCOPY  2022   [facility]-normal GERD   NASAL SEPTUM SURGERY       Current Outpatient Medications:    Cyanocobalamin (VITAMIN B-12 PO), Take 1 tablet by mouth 2 (two) times a week., Disp: , Rfl:    MAGNESIUM GLUCONATE PO, Take 200 mg by mouth daily., Disp: , Rfl:    Misc Natural Products (TART CHERRY ADVANCED PO), Take 1 tablet by mouth daily., Disp: , Rfl:    Omega-3 Fatty Acids (FISH OIL PO), Take 2 capsules by mouth daily in the afternoon., Disp: , Rfl:    omeprazole (PRILOSEC OTC) 20 MG tablet, Take 40 mg by mouth  daily., Disp: , Rfl:   Allergies  Allergen Reactions   Meperidine Other (See Comments)    Decreased BP  REACTION: decreased BP, Decreased BP   Protonix [Pantoprazole] Other (See Comments)    Headache   ROS neg/noncontributory except as noted HPI/below      Objective:     BP 111/77   Pulse 62   Temp 98.3 F (36.8 C) (Temporal)   Resp 16   Ht 5\' 6"  (1.676 m)   Wt 142 lb 8 oz (64.6 kg)   SpO2 98%   BMI 23.00 kg/m  Wt Readings from Last 3 Encounters:  09/23/22 142 lb 8 oz (64.6 kg)  06/19/22 145 lb 6.4 oz (66 kg)  05/29/22 144 lb (65.3 kg)    Physical Exam   Gen: WDWN NAD HEENT: NCAT, conjunctiva not injected, sclera nonicteric NECK:  supple, no thyromegaly, no nodes, no carotid bruits CARDIAC: RRR, S1S2+, no murmur. DP 2+B LUNGS: CTAB. No wheezes ABDOMEN:  BS+, soft, NTND, No HSM, no masses EXT:  no edema MSK: no gross abnormalities.  NEURO: A&O x3.  CN II-XII intact.  PSYCH: normal mood. Good eye contact     Assessment & Plan:  DOE (dyspnea on exertion)  COVID-19  1.  Dyspnea on exertion after COVID-19-patient is recovering.  Vital signs are reassuring as well as physical exam.  He is able to walk and do elliptical for 4 miles without any difficulties.  Difficulty seems to only be with swimming 2 laps (which is all he was doing prior to COVID) given normal exercise tolerance except for swimming, suspect his lungs are still healing and there is something during swimming (i.e. holding breath, having to breathe differently, pressure of the water, etc.) contributing to this being the only problem with this particular exercise.  Will monitor for now.  Avoid swimming for the next 1 to 2 weeks.  Then swim 1 lap and reassess breathing status.  Then can finish other lap.  Let us know if problems  Return if symptoms worsen or fail to improve.  I,Rachel Rivera,acting as a scribe for Angelena Sole, MD.,have documented all relevant documentation on the behalf of Angelena Sole,  MD,as directed by  Angelena Sole, MD while in the presence of Angelena Sole, MD.  I, Angelena Sole, MD, have reviewed all documentation for this visit. The documentation on 09/23/22 for the exam, diagnosis, procedures, and orders are all accurate and complete.    Angelena Sole, MD

## 2022-09-23 NOTE — Patient Instructions (Signed)
Avoid swimming 1-2 weeks more and then go slowly.  If problems persist, let us know

## 2022-10-25 ENCOUNTER — Encounter: Payer: Self-pay | Admitting: Family Medicine

## 2022-10-27 ENCOUNTER — Other Ambulatory Visit: Payer: Self-pay

## 2022-10-27 DIAGNOSIS — R070 Pain in throat: Secondary | ICD-10-CM

## 2022-11-03 DIAGNOSIS — H2512 Age-related nuclear cataract, left eye: Secondary | ICD-10-CM | POA: Diagnosis not present

## 2022-12-03 DIAGNOSIS — Z0100 Encounter for examination of eyes and vision without abnormal findings: Secondary | ICD-10-CM | POA: Diagnosis not present

## 2022-12-15 ENCOUNTER — Institutional Professional Consult (permissible substitution) (INDEPENDENT_AMBULATORY_CARE_PROVIDER_SITE_OTHER): Payer: Medicare HMO | Admitting: Otolaryngology

## 2022-12-19 ENCOUNTER — Ambulatory Visit (INDEPENDENT_AMBULATORY_CARE_PROVIDER_SITE_OTHER): Payer: Medicare HMO | Admitting: Family Medicine

## 2022-12-19 ENCOUNTER — Encounter: Payer: Self-pay | Admitting: Family Medicine

## 2022-12-19 VITALS — BP 122/68 | HR 56 | Temp 98.0°F | Ht 66.0 in | Wt 142.4 lb

## 2022-12-19 DIAGNOSIS — I700 Atherosclerosis of aorta: Secondary | ICD-10-CM

## 2022-12-19 DIAGNOSIS — E559 Vitamin D deficiency, unspecified: Secondary | ICD-10-CM

## 2022-12-19 DIAGNOSIS — Z23 Encounter for immunization: Secondary | ICD-10-CM

## 2022-12-19 DIAGNOSIS — I712 Thoracic aortic aneurysm, without rupture, unspecified: Secondary | ICD-10-CM

## 2022-12-19 DIAGNOSIS — E785 Hyperlipidemia, unspecified: Secondary | ICD-10-CM | POA: Diagnosis not present

## 2022-12-19 DIAGNOSIS — E538 Deficiency of other specified B group vitamins: Secondary | ICD-10-CM | POA: Diagnosis not present

## 2022-12-19 DIAGNOSIS — Z0000 Encounter for general adult medical examination without abnormal findings: Secondary | ICD-10-CM

## 2022-12-19 DIAGNOSIS — F419 Anxiety disorder, unspecified: Secondary | ICD-10-CM | POA: Diagnosis not present

## 2022-12-19 DIAGNOSIS — Z125 Encounter for screening for malignant neoplasm of prostate: Secondary | ICD-10-CM | POA: Diagnosis not present

## 2022-12-19 LAB — CBC WITH DIFFERENTIAL/PLATELET
Basophils Absolute: 0 10*3/uL (ref 0.0–0.1)
Basophils Relative: 0.6 % (ref 0.0–3.0)
Eosinophils Absolute: 0.1 10*3/uL (ref 0.0–0.7)
Eosinophils Relative: 1.7 % (ref 0.0–5.0)
HCT: 43 % (ref 39.0–52.0)
Hemoglobin: 14.3 g/dL (ref 13.0–17.0)
Lymphocytes Relative: 37.9 % (ref 12.0–46.0)
Lymphs Abs: 1.8 10*3/uL (ref 0.7–4.0)
MCHC: 33.3 g/dL (ref 30.0–36.0)
MCV: 91.9 fL (ref 78.0–100.0)
Monocytes Absolute: 0.6 10*3/uL (ref 0.1–1.0)
Monocytes Relative: 12 % (ref 3.0–12.0)
Neutro Abs: 2.3 10*3/uL (ref 1.4–7.7)
Neutrophils Relative %: 47.8 % (ref 43.0–77.0)
Platelets: 211 10*3/uL (ref 150.0–400.0)
RBC: 4.68 Mil/uL (ref 4.22–5.81)
RDW: 13.6 % (ref 11.5–15.5)
WBC: 4.9 10*3/uL (ref 4.0–10.5)

## 2022-12-19 LAB — LIPID PANEL
Cholesterol: 193 mg/dL (ref 0–200)
HDL: 69.3 mg/dL (ref >39.00)
LDL Cholesterol: 113 mg/dL — ABNORMAL HIGH (ref 0–99)
NonHDL: 123.68
Total CHOL/HDL Ratio: 3
Triglycerides: 52 mg/dL (ref 0.0–149.0)
VLDL: 10.4 mg/dL (ref 0.0–40.0)

## 2022-12-19 LAB — URINALYSIS, ROUTINE W REFLEX MICROSCOPIC
Bilirubin Urine: NEGATIVE
Hgb urine dipstick: NEGATIVE
Ketones, ur: NEGATIVE
Leukocytes,Ua: NEGATIVE
Nitrite: NEGATIVE
RBC / HPF: NONE SEEN (ref 0–?)
Specific Gravity, Urine: <=1.005 — AB (ref 1.000–1.030)
Total Protein, Urine: NEGATIVE
Urine Glucose: NEGATIVE
Urobilinogen, UA: 0.2 (ref 0.0–1.0)
WBC, UA: NONE SEEN (ref 0–?)
pH: 6.5 (ref 5.0–8.0)

## 2022-12-19 LAB — COMPREHENSIVE METABOLIC PANEL
ALT: 12 U/L (ref 0–53)
AST: 18 U/L (ref 0–37)
Albumin: 4.2 g/dL (ref 3.5–5.2)
Alkaline Phosphatase: 47 U/L (ref 39–117)
BUN: 13 mg/dL (ref 6–23)
CO2: 28 meq/L (ref 19–32)
Calcium: 9 mg/dL (ref 8.4–10.5)
Chloride: 102 meq/L (ref 96–112)
Creatinine, Ser: 0.95 mg/dL (ref 0.40–1.50)
GFR: 84.13 mL/min (ref >60.00)
Glucose, Bld: 93 mg/dL (ref 70–99)
Potassium: 4.1 meq/L (ref 3.5–5.1)
Sodium: 136 meq/L (ref 135–145)
Total Bilirubin: 0.9 mg/dL (ref 0.2–1.2)
Total Protein: 6.4 g/dL (ref 6.0–8.3)

## 2022-12-19 LAB — PSA, MEDICARE: PSA: 1.57 ng/mL (ref 0.10–4.00)

## 2022-12-19 LAB — VITAMIN B12: Vitamin B-12: 620 pg/mL (ref 211–911)

## 2022-12-19 LAB — VITAMIN D 25 HYDROXY (VIT D DEFICIENCY, FRACTURES): VITD: 24.75 ng/mL — ABNORMAL LOW (ref 30.00–100.00)

## 2022-12-19 NOTE — Patient Instructions (Addendum)
Health Maintenance Due  Topic Date Due   Medicare Annual Wellness (AWV)  Never done  If you would like we could also do a welcome to medicare in 6 months  We will call you within two weeks about your referral for CT scan through [facility].  Their phone number is [phone].  Please call them if you have not heard in 1-2 weeks  Please stop by lab before you go If you have mychart- we will send your results within 3 business days of [place] receiving them.  If you do not have mychart- we will call you about results within 5 business days of [place] receiving them.  *please also note that you will see labs on mychart as soon as they post. I will later go in and write notes on them- will say "notes from Dr. Durene Cal"   Recommended follow up: Return in about 1 year (around 12/19/2023) for physical or sooner if needed.Schedule b4 you leave.

## 2022-12-19 NOTE — Progress Notes (Signed)
Phone: [phone]   Subjective:  Patient presents today for their annual physical. Chief complaint-noted.   See problem oriented charting- ROS- full  review of systems was completed and negative  except for: mild balance issues, low energy, mild balance issues, ongoing back pain- working with [employer] [facility]- stretches daily- stiff in morning  The following were reviewed and entered/updated in epic: Past Medical History:  Diagnosis Date   ALLERGIC RHINITIS 09/25/2006   BRADYCARDIA 09/25/2006   athletic Heart rate   CHEST PAIN UNSPECIFIED 10/28/2007   cardiology evaluation including stress test reassuring   INSOMNIA, CHRONIC 08/09/2008   PALPITATIONS, CHRONIC 10/28/2007   Patient Active Problem List   Diagnosis Date Noted   Aortic atherosclerosis (HCC) 04/15/2021    Priority: High   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HCC) 08/20/2020    Priority: High   Vitamin D deficiency 09/26/2021    Priority: Medium    Lumbar radiculopathy 12/15/2018    Priority: Medium    Anxiety 08/12/2017    Priority: Medium    GERD (gastroesophageal reflux disease) 01/12/2017    Priority: Medium    Hyperlipidemia 01/12/2017    Priority: Medium    Ptosis, right 01/12/2017    Priority: Medium    Macular pucker of both eyes 01/12/2017    Priority: Low   PALPITATIONS, CHRONIC 10/28/2007    Priority: Low   Sensation of fullness in both ears 10/26/2019   Closed disp fracture of left lateral malleolus with delayed healing 10/15/2018   Past Surgical History:  Procedure Laterality Date   COLONOSCOPY  11/09/2008   [facility]-normal   COLONOSCOPY  2022   [facility]-normal GERD   NASAL SEPTUM SURGERY      Family History  Problem Relation Age of Onset   Aortic aneurysm Mother        never smoker   Breast cancer Mother        stage I- mastectomy   Cervical cancer Mother        ? uterine.    Lung cancer Father        smoker. chemo, radiation. surgery not available- spots shrunk some. depressed mood   Pulmonary  fibrosis Father        passed at 55   Scoliosis Sister        also some head trauma after abuse from spouse   Other Son        X-linked gammaglobulinemia. monthly infusions   Healthy Son    Other Maternal Grandmother        unilateral kidney but lived to 35   Heart attack Paternal Grandfather        younger than 16- but patients dad has not had issues   Colon cancer Neg Hx    Colon polyps Neg Hx    Esophageal cancer Neg Hx    Rectal cancer Neg Hx    Stomach cancer Neg Hx     Medications- reviewed and updated Current Outpatient Medications  Medication Sig Dispense Refill   Cyanocobalamin (VITAMIN B-12 PO) Take 1 tablet by mouth 2 (two) times a week.     MAGNESIUM GLUCONATE PO Take 200 mg by mouth daily.     Misc Natural Products (TART CHERRY ADVANCED PO) Take 1 tablet by mouth daily.     Omega-3 Fatty Acids (FISH OIL PO) Take 2 capsules by mouth daily in the afternoon.     omeprazole (PRILOSEC OTC) 20 MG tablet Take 40 mg by mouth daily.     No current facility-administered medications for  this visit.    Allergies-reviewed and updated Allergies  Allergen Reactions   Meperidine Other (See Comments)    Decreased BP  REACTION: decreased BP, Decreased BP   Protonix [Pantoprazole] Other (See Comments)    Headache    Social History   Social History Narrative   Married. 2 sons 30 and 27 in 2018. No grandkids yet. Has some grandcats.         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 Oropharynx normal Neck: no thyromegaly CV: RRR no murmurs rubs or gallops Lungs: CTAB no crackles, wheeze,  rhonchi Abdomen: soft/nontender/nondistended/normal bowel sounds. No rebound or guarding.  Ext: no edema Skin: warm, dry Neuro: grossly normal, moves all extremities, PERRLA   Assessment and Plan  67 y.o. male presenting for annual physical.  Health Maintenance counseling: 1. Anticipatory guidance: Patient counseled regarding regular dental exams -q6 months, eye exams -yearly plus had recent left cataract surgery,  avoiding smoking and second hand smoke , limiting alcohol to 2 beverages per day -1 bottle of wine per week, no illicit drugs .   2. Risk factor reduction:  Advised patient of need for regular exercise and diet rich and fruits and vegetables to reduce risk of heart attack and stroke.  Exercise- [facility] 4-5 days a week still.  Diet/weight management-has maintain healthy weight and healthy diet.  Wt Readings from Last 3 Encounters:  12/19/22 142 lb 6.4 oz (64.6 kg)  09/23/22 142 lb 8 oz (64.6 kg)  06/19/22 145 lb 6.4 oz (66 kg)  3. Immunizations/screenings/ancillary studies-opts out of COVID shot.  We discussed Prevnar 20- opts out for now, already had flu shot today  Immunization History  Administered Date(s) Administered   Fluad Trivalent(High Dose 65+) 12/19/2022   Influenza Inj Mdck Quad Pf 11/14/2018   Influenza Split 12/09/2010   Influenza Whole 02/10/2005, 12/08/2006, 10/28/2007, 11/24/2008, 11/19/2009   Influenza,inj,Quad PF,6+ Mos 12/05/2013, 12/08/2014, 10/16/2015, 12/01/2016   Influenza-Unspecified 11/14/2018, 11/22/2019, 12/18/2020, 12/10/2021   PFIZER(Purple Top)SARS-COV-2 Vaccination 04/11/2019, 05/11/2019, 02/18/2020   Td 02/10/2005   Tdap 10/16/2015   Zoster Recombinant(Shingrix) 06/15/2017, 08/31/2017   Zoster, Live 10/06/2012   4. Prostate cancer screening-  low risk prior trend- update psa today  Lab Results  Component Value Date   PSA 1.19 09/26/2021   PSA 1.31 07/05/2020   PSA 1.98 04/06/2019  5. Colon cancer screening - History of adenomatous polyp of  the colon-April 20, 2020 with 7-year repeat with Dr. Russella Dar  6. Skin cancer screening- every other year- scheduled January.  advised regular sunscreen use. Denies worrisome, changing, or new skin lesions.  7. Smoking associated screening (lung cancer screening, AAA screen 65-75, UA)- never smoker  8. STD screening - only active with wife   Status of chronic or acute concerns   #Thoracic aortic aneurysm followed by Dr. Eden Emms- per note 01/30/21 " Aorta only 4.0 cm can image again in 2 years "- order today  #Memory loss- prior issues have not worsened but maintain about the same  level- some forgetfulness with names for instance  #ongoing low energy- but has a very busy schedule -up around 4 15- swims, elliptical then goes to work and gets home 3-4 . At end of the day he tends to feel tired. On Saturdays he is tired at home- sometimes naps on Saturdays and feels refreshed - bed by 8 30 and asleep by 9 but can be restless. Up once or twice a night going to bathroom. Thinks getting at least 6-7 hours or more - hired Engineer, water- he may be able to start waking up a little later   #hyperlipidemia #aortic atherosclerosis- but with ct cardiac scoring 0 on 08/20/20 S: Medication:omega 3 fatty acids. Tries to eat very healthy diet and exercise regularl Lab Results  Component Value Date   CHOL 183 09/26/2021   HDL 67.70 09/26/2021   LDLCALC 105 (H) 09/26/2021   LDLDIRECT 117.7 11/19/2009   TRIG 52.0 09/26/2021   CHOLHDL 3 09/26/2021  A/P:  hopefully stable or improved- update lipid panel today. Continue current meds for now . He wants to hold off on statin for now to target LDL under 70.    #Low normal B12- have recommended patient take B12 supplements- he takes daily Lab Results  Component Value Date   VITAMINB12 459 09/26/2021   #Gerd- on dexilant 60 mg through Dr. Russella Dar- last visit 10/01/20 - does 40 mg omeprazole in am-purchases otc -presented with Teeth decay-dentist has thought he  could potentially have acid reflux or sleep apnea  - he reports some phlegm in throat in mornings in last few weeks and wonders if reflux related. He plans to schedule appointment with Dr. Russella Dar or colleague - but continue current medications for now   #Vitamin D deficiency- as low as 17 in 2020 S: Medication:  1000 units per day in past but now on multivitamin with vitamin D A/P: hopefully stable- update vitmain D today. Continue current meds for now     Recommended follow up: Return in about 1 year (around 12/19/2023) for physical or sooner if needed.Schedule b4 you leave.  Lab/Order associations: fasting   ICD-10-CM   1. Need for influenza vaccination  Z23 Flu Vaccine Trivalent High Dose (Fluad)    2. Preventative health care  Z00.00     3. Thoracic aortic aneurysm without rupture, unspecified part (HCC) Chronic I71.20     4. Anxiety  F41.9     5. Aortic atherosclerosis (HCC)  I70.0     6. Hyperlipidemia, unspecified hyperlipidemia type  E78.5     7. Vitamin D deficiency  E55.9       No orders of the defined types were placed in this encounter.   Return precautions advised.  Tana Conch, MD

## 2022-12-21 ENCOUNTER — Encounter: Payer: Self-pay | Admitting: Family Medicine

## 2022-12-22 IMAGING — DX DG LUMBAR SPINE 2-3V
3 series · 3 of 3 positions shown · non-contrast
Comparison: None.

CLINICAL DATA: Chronic low back pain.  No known injury.

EXAM:
LUMBAR SPINE - 2-3 VIEW

[l-spine ap]
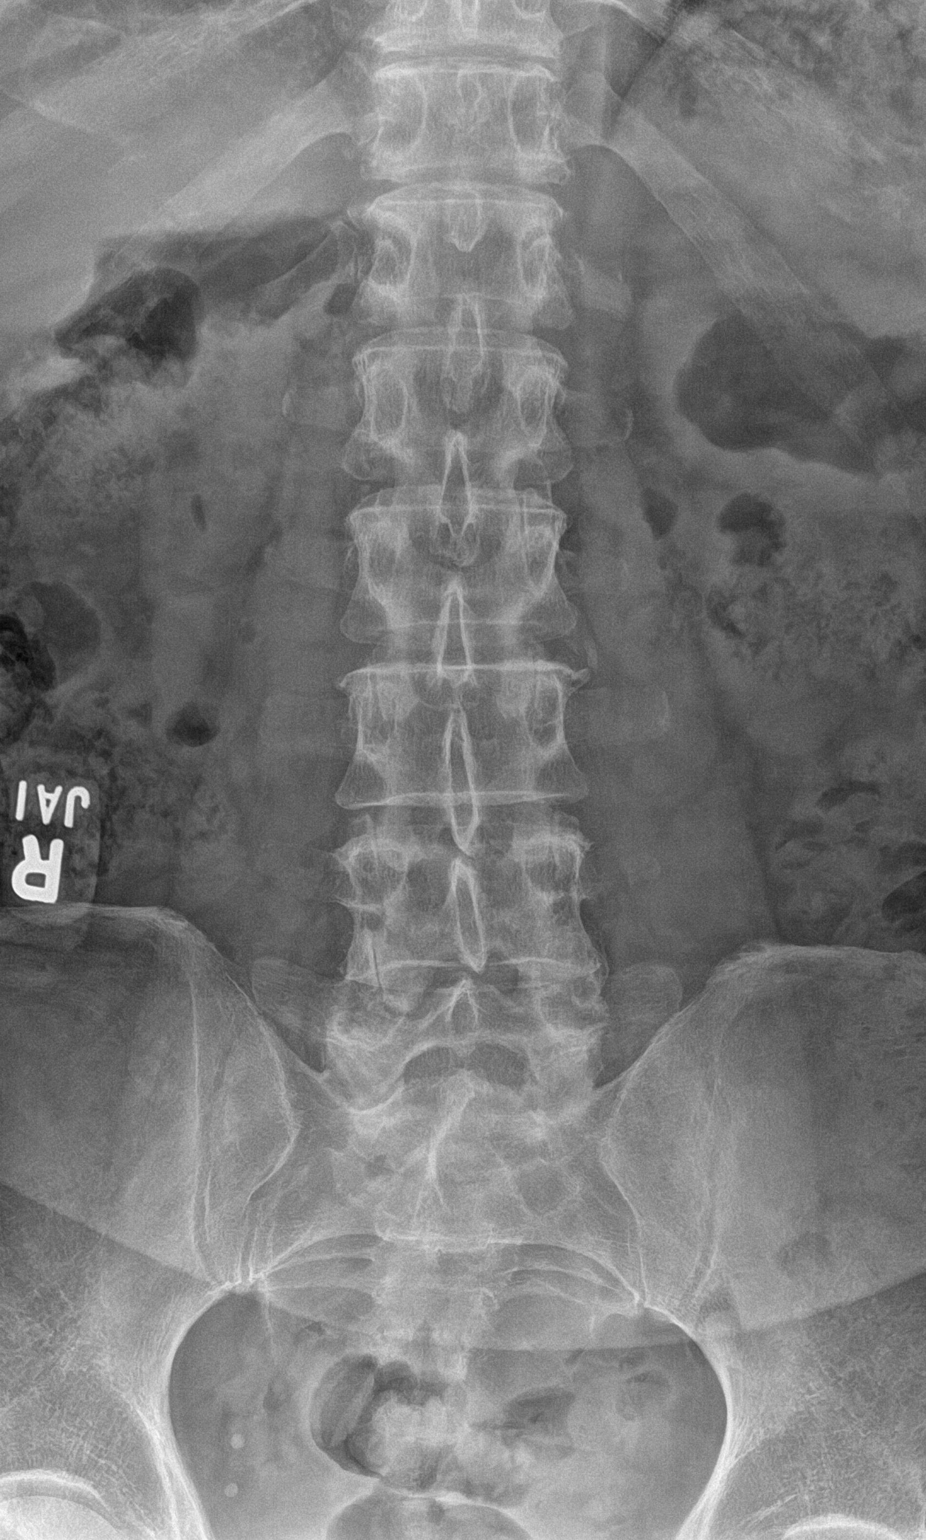

[l-spine lateral]
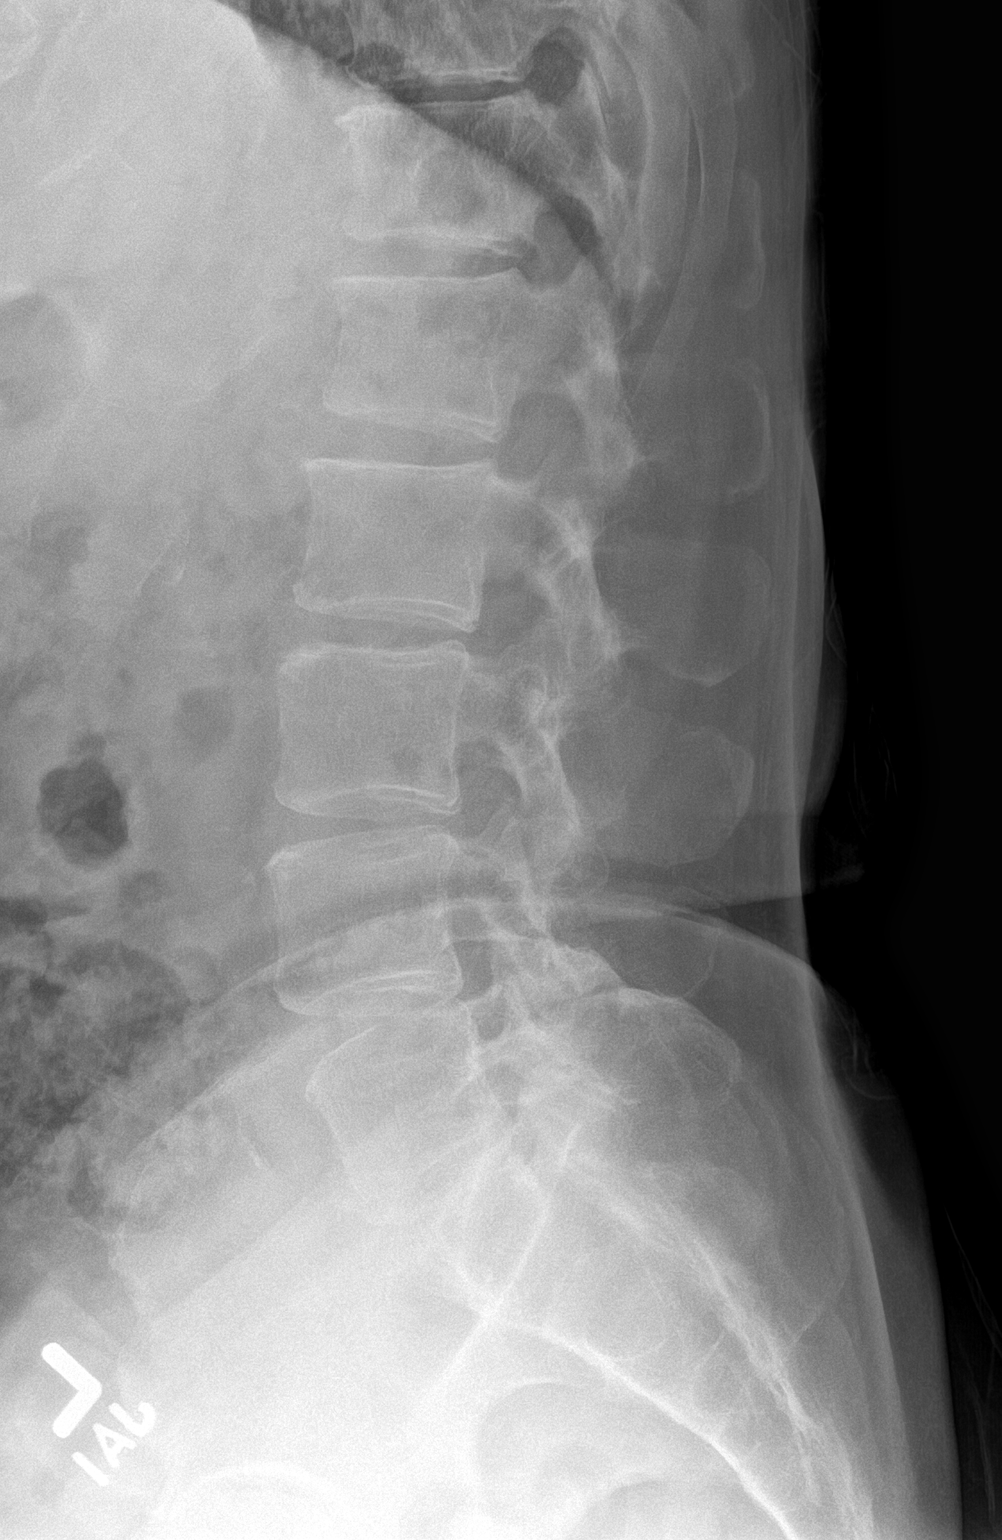

[l-spine spot]
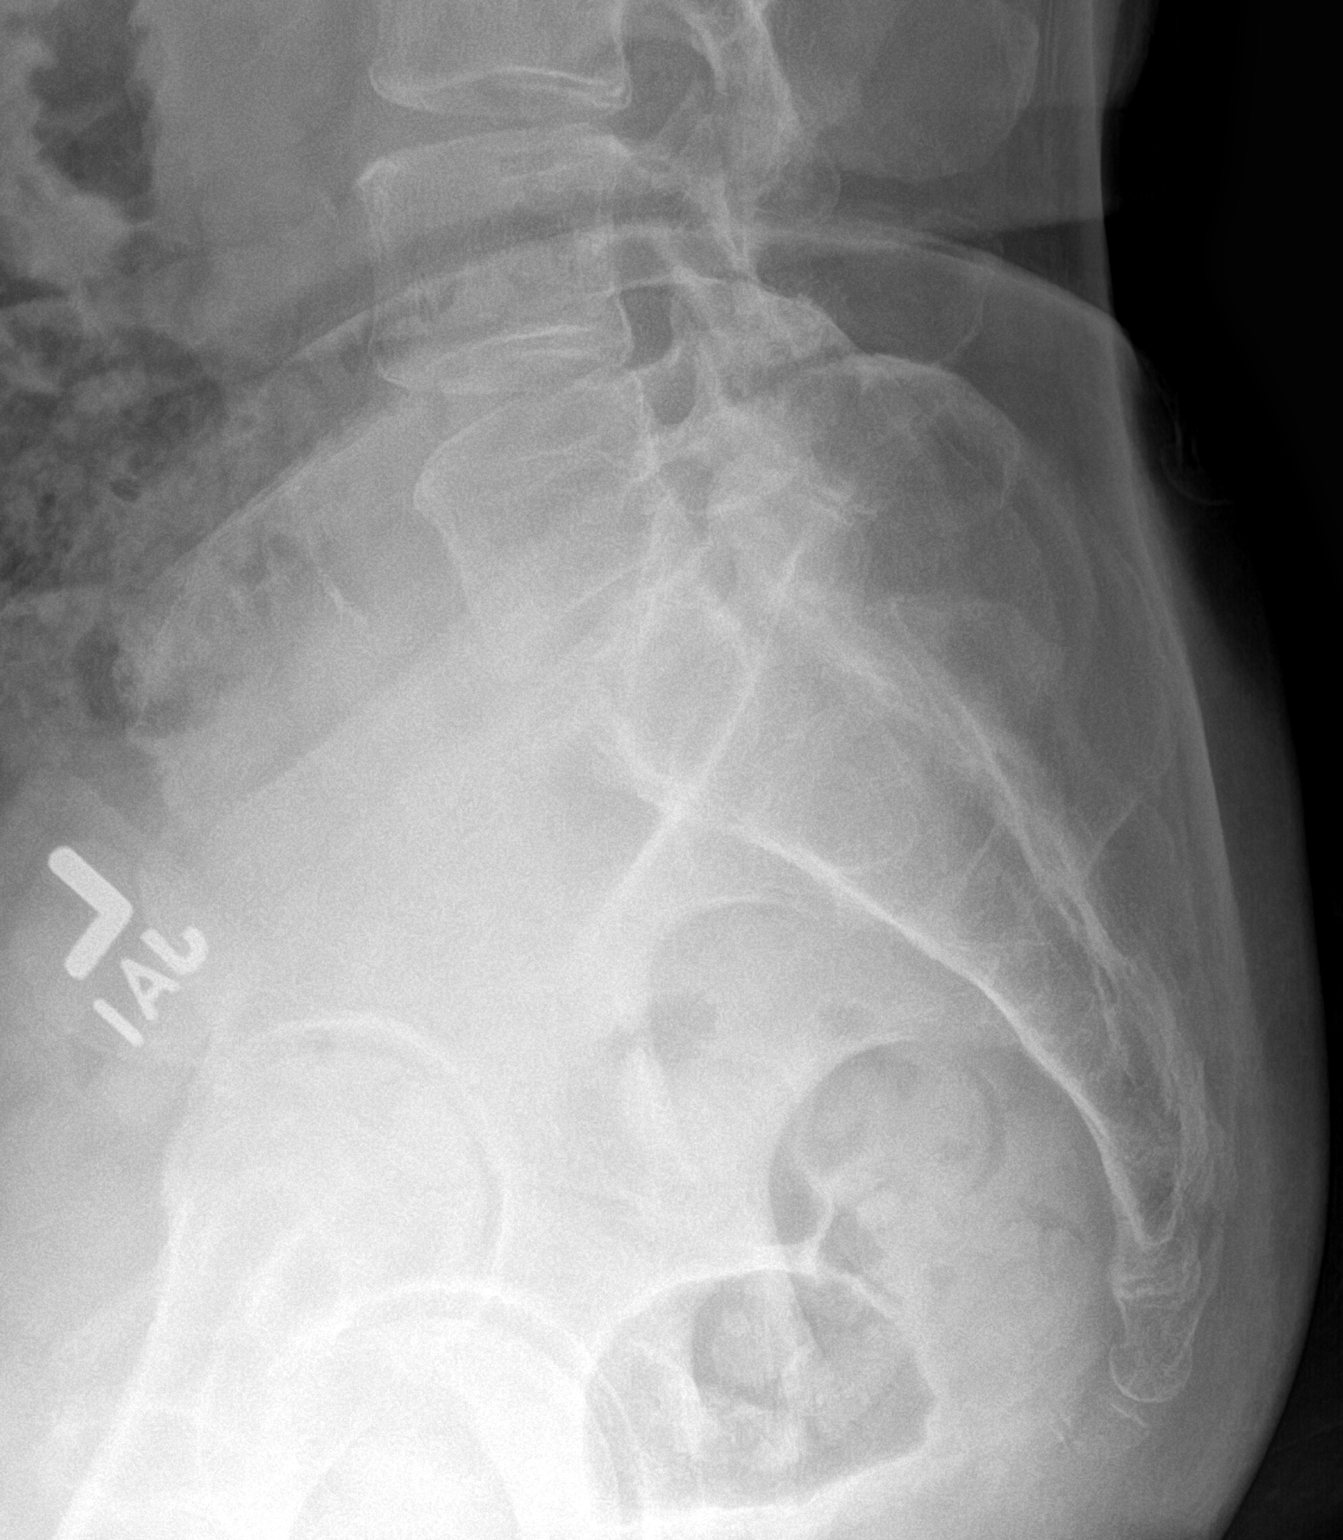

[3 of 3 positions shown; findings below may reference images not displayed]

FINDINGS: No fracture or traumatic malalignment. Multilevel degenerative disc
disease relatively mild with small anterior osteophytes. Mild lower
lumbar facet degenerative changes identified. Mild calcified
atherosclerosis in the abdominal aorta.
IMPRESSION: 1. Degenerative changes in the lumbar spine as above.
2. Very mild calcified atherosclerotic changes suspected in the
abdominal aorta.

## 2022-12-24 ENCOUNTER — Ambulatory Visit
Admission: RE | Admit: 2022-12-24 | Discharge: 2022-12-24 | Disposition: A | Discharge status: Home / Self Care | Payer: Medicare HMO | Source: Ambulatory Visit | Attending: Family Medicine | Admitting: Family Medicine

## 2022-12-24 DIAGNOSIS — I712 Thoracic aortic aneurysm, without rupture, unspecified: Secondary | ICD-10-CM

## 2022-12-24 DIAGNOSIS — I7121 Aneurysm of the ascending aorta, without rupture: Secondary | ICD-10-CM | POA: Diagnosis not present

## 2022-12-24 MED ORDER — IOPAMIDOL (ISOVUE-370) INJECTION 76%
75.0000 mL | Freq: Once | INTRAVENOUS | Status: AC | PRN
  Administered 2022-12-24: 75 mL via INTRAVENOUS

## 2022-12-31 ENCOUNTER — Ambulatory Visit: Payer: Medicare HMO | Admitting: Gastroenterology

## 2022-12-31 ENCOUNTER — Encounter: Payer: Self-pay | Admitting: Gastroenterology

## 2022-12-31 VITALS — BP 102/64 | HR 75 | Ht 66.0 in | Wt 143.0 lb

## 2022-12-31 DIAGNOSIS — K2100 Gastro-esophageal reflux disease with esophagitis, without bleeding: Secondary | ICD-10-CM | POA: Diagnosis not present

## 2022-12-31 DIAGNOSIS — R110 Nausea: Secondary | ICD-10-CM | POA: Diagnosis not present

## 2022-12-31 DIAGNOSIS — R093 Abnormal sputum: Secondary | ICD-10-CM

## 2022-12-31 DIAGNOSIS — R142 Eructation: Secondary | ICD-10-CM | POA: Diagnosis not present

## 2022-12-31 DIAGNOSIS — R09A2 Foreign body sensation, throat: Secondary | ICD-10-CM

## 2022-12-31 NOTE — Addendum Note (Signed)
Addended by: Justice Britain on: 12/31/2022 10:55 AM   Modules accepted: Orders

## 2022-12-31 NOTE — Progress Notes (Signed)
Discussed the use of AI scribe software for clinical note transcription with the patient, who gave verbal consent to proceed.  HPI : Jay Combs is a 67 y.o. male with a history of reflux, previously followed by Dr. Russella Dar presents for follow-up of suspected reflux symptoms. Today, he presents with a chief complaint of a sensation of something in his throat and increased phlegm production. These symptoms began approximately two to three months ago and have been occurring daily. The patient reports a history of reflux, but his typical symptoms are nausea and belching, which are well-controlled with omeprazole. The patient has been adhering to a healthy diet, avoiding known triggers such as coffee, oranges, and tomatoes.  The patient has been taking omeprazole 40mg  at night, approximately 2.5 hours after dinner. He reports that taking the medication at night has reduced his morning symptoms compared to when he took it in the morning.  The patient has a history of esophagitis, with his last endoscopy two years ago revealing LA grade B esophagitis. He expresses concern about the potential progression of this condition and is open to repeating an endoscopy. The patient also reports occasional difficulty swallowing large pills, such as magnesium, but does not find this to be a consistent issue.  The patient has no known risk factors for esophageal cancer, such as smoking or family history. 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 The patient also mentions occasional constipation.     EGD June 2022 (Dr. Lavon Paganini) LA grade B esophagitis Hill grade 2 valve Antral erosions and erythema Normal duodenum  Colonoscopy March 2022 (Dr. Russella Dar) 3 mm polyp in transverse colon Grade 1 internal hemorrhoids  EGD 2014 (Dr. Russella Dar) Normal  Colonoscopy 2010 (Dr. Russella Dar) Normal   Past Medical History:  Diagnosis Date   ALLERGIC RHINITIS 09/25/2006   BRADYCARDIA 09/25/2006    athletic Heart rate   CHEST PAIN UNSPECIFIED 10/28/2007   cardiology evaluation including stress test reassuring   INSOMNIA, CHRONIC 08/09/2008   PALPITATIONS, CHRONIC 10/28/2007     Past Surgical History:  Procedure Laterality Date   COLONOSCOPY  11/09/2008   [facility]-normal   COLONOSCOPY  2022   Stark-normal GERD   left cataract surgery Left    sept 2024   NASAL SEPTUM SURGERY     Family History  Problem Relation Age of Onset   Aortic aneurysm Mother        never smoker   Breast cancer Mother        stage I- mastectomy   Cervical cancer Mother        ? uterine.    Lung cancer Father        smoker. chemo, radiation. surgery not available- spots shrunk some. depressed mood   Pulmonary fibrosis Father        passed at 53   Scoliosis Sister        also some head trauma after abuse from spouse   Other Son        X-linked gammaglobulinemia. monthly infusions   Healthy Son    Other Maternal Grandmother        unilateral kidney but lived to 32   Heart attack Paternal Grandfather        younger than 54- but patients dad has not had issues   Colon cancer Neg Hx    Colon polyps Neg Hx    Esophageal cancer Neg Hx    Rectal cancer Neg Hx    Stomach cancer Neg Hx  Social History   Tobacco Use   Smoking status: Never   Smokeless tobacco: Never  Vaping Use   Vaping status: Never Used  Substance Use Topics   Alcohol use: Yes    Alcohol/week: 7.0 standard drinks of alcohol    Types: 7 Standard drinks or equivalent per week   Drug use: No   Current Outpatient Medications  Medication Sig Dispense Refill   Cyanocobalamin (VITAMIN B-12 PO) Take 1 tablet by mouth 2 (two) times a week.     MAGNESIUM GLUCONATE PO Take 200 mg by mouth daily.     Misc Natural Products (TART CHERRY ADVANCED PO) Take 1 tablet by mouth daily.     Omega-3 Fatty Acids (FISH OIL PO) Take 2 capsules by mouth daily in the afternoon.     omeprazole (PRILOSEC OTC) 20 MG tablet Take 40 mg by mouth daily.      No current facility-administered medications for this visit.   Allergies  Allergen Reactions   Meperidine Other (See Comments)    Decreased BP  REACTION: decreased BP, Decreased BP   Protonix [Pantoprazole] Other (See Comments)    Headache     Review of Systems: All systems reviewed and negative except where noted in HPI.    No results found.  Physical Exam: BP 102/64   Pulse 75   Ht 5\' 6"  (1.676 m)   Wt 143 lb (64.9 kg)   BMI 23.08 kg/m  Constitutional: Pleasant,well-developed, Caucasian male in no acute distress. HEENT: Normocephalic and atraumatic. Conjunctivae are normal. No scleral icterus.  Cardiovascular: Normal rate, regular rhythm.  Pulmonary/chest: Effort normal and breath sounds normal. No wheezing, rales or rhonchi. Abdominal: Soft, nondistended, nontender. Bowel sounds active throughout. There are no masses palpable. No hepatomegaly. Extremities: no edema Neurological: Alert and oriented to person place and time. Skin: Skin is warm and dry. No rashes noted. Psychiatric: Normal mood and affect. Behavior is normal.  CBC    Component Value Date/Time   WBC 4.9 12/19/2022 1012   RBC 4.68 12/19/2022 1012   HGB 14.3 12/19/2022 1012   HCT 43.0 12/19/2022 1012   PLT 211.0 12/19/2022 1012   MCV 91.9 12/19/2022 1012   MCH 30.0 09/14/2017 1255   MCHC 33.3 12/19/2022 1012   RDW 13.6 12/19/2022 1012   LYMPHSABS 1.8 12/19/2022 1012   MONOABS 0.6 12/19/2022 1012   EOSABS 0.1 12/19/2022 1012   BASOSABS 0.0 12/19/2022 1012    CMP     Component Value Date/Time   NA 136 12/19/2022 1012   NA 139 01/24/2021 0729   K 4.1 12/19/2022 1012   CL 102 12/19/2022 1012   CO2 28 12/19/2022 1012   GLUCOSE 93 12/19/2022 1012   GLUCOSE 91 12/05/2005 0820   BUN 13 12/19/2022 1012   BUN 21 01/24/2021 0729   CREATININE 0.95 12/19/2022 1012   CREATININE 0.95 05/02/2014 0947   CALCIUM 9.0 12/19/2022 1012   PROT 6.4 12/19/2022 1012   ALBUMIN 4.2 12/19/2022 1012   AST 18  12/19/2022 1012   ALT 12 12/19/2022 1012   ALKPHOS 47 12/19/2022 1012   BILITOT 0.9 12/19/2022 1012   GFRNONAA >60 09/14/2017 1255   GFRAA >60 09/14/2017 1255       Latest Ref Rng & Units 12/19/2022   10:12 AM 09/26/2021    8:45 AM 10/04/2020    8:40 AM  CBC EXTENDED  WBC 4.0 - 10.5 K/uL 4.9  4.3  5.5   RBC 4.22 - 5.81 Mil/uL 4.68  4.42  4.46   Hemoglobin 13.0 - 17.0 g/dL 06.2  37.6  28.3   HCT 39.0 - 52.0 % 43.0  40.4  40.3   Platelets 150.0 - 400.0 K/uL 211.0  185.0  195.0   NEUT# 1.4 - 7.7 K/uL 2.3  2.0  2.7   Lymph# 0.7 - 4.0 K/uL 1.8  1.7  2.0       ASSESSMENT AND PLAN:  67 year old male with longstanding GERD symptoms, with index symptoms of nausea and belching, now with globus sensation and excessive phlegm.  I suspect the symptoms are GERD related.  He has a history of LA grade B esophagitis.  Gastroesophageal Reflux Disease (GERD) with Esophagitis Recurrent GERD symptoms including phlegm and throat irritation, particularly in the morning. LA grade B esophagitis diagnosed two years ago. Symptoms have worsened over the past 2-3 months despite omeprazole 40 mg at night. Possible ongoing esophagitis or complications such as stricture or Barrett's esophagus. Discussed chronic inflammation risks including esophageal cancer and strictures. Patient prefers minimal medication but agrees to the proposed plan. - Schedule upper endoscopy to evaluate for ongoing esophagitis and complications - Advise taking omeprazole 30-45 minutes before dinner - Recommend over-the-counter Gaviscon at bedtime - Consider elevating the head of the bed to reduce nighttime reflux - Provide guidance on lifestyle modifications: avoid coffee, tomatoes, citrus, mints, and monitor alcohol intake - If esophagitis still persistent, would recommend changing and maximizing PPI.  As patient prefers to avoid medicines, he may also be a candidate for TIF.  The details, risks (including bleeding, perforation,  infection, missed lesions, medication reactions and possible hospitalization or surgery if complications occur), benefits, and alternatives to EGD with possible biopsy and possible dilation were discussed with the patient and he consents to proceed.    Korine Winton E. Tomasa Rand, MD [facility]  I spent a total of 25 minutes reviewing the patient's medical record, interviewing and examining the patient, discussing her diagnosis and management of her condition going forward, and documenting in the medical record     Shelva Majestic, MD

## 2022-12-31 NOTE — Patient Instructions (Addendum)
You have been scheduled for an endoscopy. Please follow written instructions given to you at your visit today.  If you use inhalers (even only as needed), please bring them with you on the day of your procedure.  If you take any of the following medications, they will need to be adjusted prior to your procedure:   DO NOT TAKE 7 DAYS PRIOR TO TEST- Trulicity (dulaglutide) Ozempic, Wegovy (semaglutide) Mounjaro (tirzepatide) Bydureon Bcise (exanatide extended release)  DO NOT TAKE 1 DAY PRIOR TO YOUR TEST Rybelsus (semaglutide) Adlyxin (lixisenatide) Victoza (liraglutide) Byetta (exanatide) ___________________________________________________________________________     Please take Omeprazole 30-45 minutes before meals. Follow dietary restrictions discussed and elevate your head at bedtime.   _______________________________________________________  If your blood pressure at your visit was 140/90 or greater, please contact your primary care physician to follow up on this.  _______________________________________________________  If you are age 89 or older, your body mass index should be between 23-30. Your Body mass index is 23.08 kg/m. If this is out of the aforementioned range listed, please consider follow up with your Primary Care Provider.  If you are age 52 or younger, your body mass index should be between 19-25. Your Body mass index is 23.08 kg/m. If this is out of the aformentioned range listed, please consider follow up with your Primary Care Provider.   ________________________________________________________  The [place] [facility] providers would like to encourage you to use [facility] to communicate with providers for non-urgent requests or questions.  Due to long hold times on the telephone, sending your provider a message by [facility] may be a faster and more efficient way to get a response.  Please allow 48 business hours for a response.  Please remember that this is for  non-urgent requests.   It was a pleasure to see you today!  Thank you for trusting me with your gastrointestinal care!    Scott E.Tomasa Rand, MD

## 2023-01-05 ENCOUNTER — Other Ambulatory Visit: Payer: Self-pay | Admitting: Family Medicine

## 2023-01-05 ENCOUNTER — Encounter: Payer: Self-pay | Admitting: Family Medicine

## 2023-01-05 DIAGNOSIS — I719 Aortic aneurysm of unspecified site, without rupture: Secondary | ICD-10-CM | POA: Insufficient documentation

## 2023-01-06 ENCOUNTER — Ambulatory Visit: Payer: Medicare HMO | Admitting: Vascular Surgery

## 2023-01-06 ENCOUNTER — Encounter: Payer: Self-pay | Admitting: Vascular Surgery

## 2023-01-06 VITALS — BP 102/69 | HR 64 | Temp 98.1°F | Resp 20 | Ht 66.0 in | Wt 144.9 lb

## 2023-01-06 DIAGNOSIS — K551 Chronic vascular disorders of intestine: Secondary | ICD-10-CM | POA: Diagnosis not present

## 2023-01-06 NOTE — Progress Notes (Signed)
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 It is incompletely imaged on CT angiogram of the chest. Thankfully, it has been stable since at least 2022.   Recommend:  Abstinence from all tobacco products. Blood glucose control with goal A1c < 7%. Blood pressure control with goal blood pressure < 140/90 mmHg. Lipid reduction therapy with goal LDL-C <100 mg/dL Aspirin 81mg  PO QD.  Atorvastatin 40-80mg  PO QD (or other "high intensity" statin therapy).  We will check a CT angiogram of the abdomen and pelvis. I will call him with the results.  CHIEF COMPLAINT: Irregularity on CT scan  HISTORY OF PRESENT ILLNESS: Jay Combs is a 67 y.o. male referred to clinic for evaluation of irregularity identified on CT scan.  Patient has a known ascending aortic aneurysm which is being monitored with serial CT scans.  An irregularity in the superior mesenteric artery was noted on a CT scan earlier this year.  This has been present since last 2022, though it was not noted at the time.  The patient is in excellent health.  He has no classic risk factors for atherosclerotic disease.  We reviewed his CT scan in detail.  He asked many excellent questions.  Past Medical History:  Diagnosis Date   ALLERGIC RHINITIS 09/25/2006   BRADYCARDIA 09/25/2006   athletic Heart rate   CHEST PAIN UNSPECIFIED 10/28/2007   cardiology evaluation including stress test reassuring   INSOMNIA, CHRONIC 08/09/2008   PALPITATIONS, CHRONIC 10/28/2007    Past Surgical History:  Procedure Laterality Date   COLONOSCOPY  11/09/2008   [facility]-normal   COLONOSCOPY  2022   [facility]-normal GERD   left cataract surgery Left    sept 2024   NASAL SEPTUM SURGERY      Family History  Problem Relation Age of Onset   Aortic aneurysm Mother        never smoker   Breast cancer  Mother        stage I- mastectomy   Cervical cancer Mother        ? uterine.    Lung cancer Father        smoker. chemo, radiation. surgery not available- spots shrunk some. depressed mood   Pulmonary fibrosis Father        passed at 14   Scoliosis Sister        also some head trauma after abuse from spouse   Other Son        X-linked gammaglobulinemia. monthly infusions   Healthy Son    Other Maternal Grandmother        unilateral kidney but lived to 36   Heart attack Paternal Grandfather        younger than 27- but patients dad has not had issues   Colon cancer Neg Hx    Colon polyps Neg Hx    Esophageal cancer Neg Hx    Rectal cancer Neg Hx    Stomach cancer Neg Hx     Social History   Socioeconomic History   Marital status: Married    Spouse name: Not on file   Number of children: 2   Years of education: Not on file   Highest education level: Not on file  Occupational History   Occupation: Administrator, Civil Service: [employer]  Tobacco Use   Smoking status: Never   Smokeless tobacco: Never  Vaping  Use   Vaping status: Never Used  Substance and Sexual Activity   Alcohol use: Yes    Alcohol/week: 7.0 standard drinks of alcohol    Types: 7 Standard drinks or equivalent per week   Drug use: No   Sexual activity: Not on file  Other Topics Concern   Not on file  Social History Narrative   Married. 2 sons 30 and 27 in 2018. No grandkids yet. Has some grandcats.         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 Started with a friend- they bought together. January 2020.    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        Hobbies: reading, time with family, not a big sports person   Social Determinants of Health   Financial Resource Strain: Not on file  Food Insecurity: Low Risk  (06/27/2022)   [facility]   Hunger Vital Sign    Worried About Running Out of  Food in the Last Year: Never true    Ran Out of Food in the Last Year: Never true  Transportation Needs: No Transportation Needs (06/27/2022)   [facility]   Transportation    In the past 12 months, has lack of reliable transportation kept you from medical appointments, meetings, work or from getting things needed for daily living? : No  Physical Activity: Not on file  Stress: Not on file  Social Connections: Not on file  Intimate Partner Violence: Not on file    Allergies  Allergen Reactions   Meperidine Other (See Comments)    Decreased BP  REACTION: decreased BP, Decreased BP   Protonix [Pantoprazole] Other (See Comments)    Headache    Current Outpatient Medications  Medication Sig Dispense Refill   Cyanocobalamin (VITAMIN B-12 PO) Take 1 tablet by mouth 2 (two) times a week.     MAGNESIUM GLUCONATE PO Take 200 mg by mouth daily.     Misc Natural Products (TART CHERRY ADVANCED PO) Take 1 tablet by mouth daily.     Omega-3 Fatty Acids (FISH OIL PO) Take 2 capsules by mouth daily in the afternoon.     omeprazole (PRILOSEC OTC) 20 MG tablet Take 40 mg by mouth daily.     No current facility-administered medications for this visit.    PHYSICAL EXAM Vitals:   01/06/23 1343  BP: 102/69  Pulse: 64  Resp: 20  Temp: 98.1 F (36.7 C)  SpO2: 98%  Weight: 144 lb 14.4 oz (65.7 kg)  Height: 5\' 6"  (1.676 m)   Well-appearing man in no distress Regular rate and rhythm Unlabored breathing Abdomen soft nontender nondistended  PERTINENT LABORATORY AND RADIOLOGIC DATA  Most recent CBC    Latest Ref Rng & Units 12/19/2022   10:12 AM 09/26/2021    8:45 AM 10/04/2020    8:40 AM  CBC  WBC 4.0 - 10.5 K/uL 4.9  4.3  5.5   Hemoglobin 13.0 - 17.0 g/dL 96.0  45.4  09.8   Hematocrit 39.0 - 52.0 % 43.0  40.4  40.3   Platelets 150.0 - 400.0 K/uL 211.0  185.0  195.0      Most recent CMP    Latest Ref Rng & Units 12/19/2022   10:12 AM 09/26/2021    8:45  AM 01/24/2021    7:29 AM  CMP  Glucose 70 - 99 mg/dL 93  96  91   BUN 6 - 23 mg/dL 13  14  21    Creatinine 0.40 - 1.50 mg/dL 7.82  9.56  2.13   Sodium 135 - 145 mEq/L 136  138  139   Potassium 3.5 - 5.1 mEq/L 4.1  3.8  4.5   Chloride 96 - 112 mEq/L 102  102  104   CO2 19 - 32 mEq/L 28  27  27    Calcium 8.4 - 10.5 mg/dL 9.0  8.7  9.5   Total Protein 6.0 - 8.3 g/dL 6.4  6.2    Total Bilirubin 0.2 - 1.2 mg/dL 0.9  0.9    Alkaline Phos 39 - 117 U/L 47  44    AST 0 - 37 U/L 18  23    ALT 0 - 53 U/L 12  17      Renal function Estimated Creatinine Clearance: 70 mL/min (by C-G formula based on SCr of 0.95 mg/dL).  Hemoglobin A1C (no units)  Date Value  05/05/2018 5.4    LDL Cholesterol  Date Value Ref Range Status  12/19/2022 113 (H) 0 - 99 mg/dL Final   Direct LDL  Date Value Ref Range Status  11/19/2009 117.7 mg/dL Final    Comment:    See lab report for associated comment(s)    CT angiogram of the chest personally reviewed Irregularity in the superior mesenteric artery.  The reading radiologist was worried this could be a penetrating atherosclerotic ulcer.  This is possible.  The artery is incompletely imaged, and so I am a little leery to render definitive opinion.  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  He does have some atherosclerotic changes.  Rande Brunt. Lenell Antu, MD [facility] Phone Number: [phone] 01/06/2023 2:28 PM   Total time spent on preparing this encounter including chart review, data review, collecting history, examining the patient, coordinating care for this new patient, 60 minutes.  Portions of this report may have been transcribed using voice recognition software.  Every effort has been made to ensure accuracy; however, inadvertent computerized transcription errors may still be present.

## 2023-01-07 ENCOUNTER — Encounter: Payer: Self-pay | Admitting: Family Medicine

## 2023-01-07 ENCOUNTER — Other Ambulatory Visit: Payer: Self-pay

## 2023-01-07 MED ORDER — ROSUVASTATIN CALCIUM 5 MG PO TABS: 5.0000 mg | ORAL_TABLET | Freq: Every day | ORAL | 3 refills | Status: DC

## 2023-01-12 ENCOUNTER — Other Ambulatory Visit: Payer: Self-pay

## 2023-01-12 DIAGNOSIS — K551 Chronic vascular disorders of intestine: Secondary | ICD-10-CM

## 2023-01-14 ENCOUNTER — Encounter: Payer: Self-pay | Admitting: Gastroenterology

## 2023-01-14 ENCOUNTER — Encounter: Payer: Self-pay | Admitting: Vascular Surgery

## 2023-01-15 ENCOUNTER — Encounter: Payer: Self-pay | Admitting: Gastroenterology

## 2023-01-15 ENCOUNTER — Ambulatory Visit (AMBULATORY_SURGERY_CENTER): Payer: Medicare HMO | Admitting: Gastroenterology

## 2023-01-15 VITALS — BP 105/67 | HR 85 | Temp 98.1°F | Resp 13 | Ht 66.0 in | Wt 143.0 lb

## 2023-01-15 DIAGNOSIS — K2100 Gastro-esophageal reflux disease with esophagitis, without bleeding: Secondary | ICD-10-CM

## 2023-01-15 DIAGNOSIS — K317 Polyp of stomach and duodenum: Secondary | ICD-10-CM

## 2023-01-15 DIAGNOSIS — K219 Gastro-esophageal reflux disease without esophagitis: Secondary | ICD-10-CM

## 2023-01-15 DIAGNOSIS — K319 Disease of stomach and duodenum, unspecified: Secondary | ICD-10-CM | POA: Diagnosis not present

## 2023-01-15 MED ORDER — SODIUM CHLORIDE 0.9 % IV SOLN: 500.0000 mL | Freq: Once | INTRAVENOUS | Status: DC

## 2023-01-15 NOTE — Patient Instructions (Addendum)
YOU HAD AN ENDOSCOPIC PROCEDURE TODAY AT THE [facility]:   Refer to the procedure report that was given to you for any specific questions about what was found during the examination.  If the procedure report does not answer your questions, please call your gastroenterologist to clarify.  If you requested that your care partner not be given the details of your procedure findings, then the procedure report has been included in a sealed envelope for you to review at your convenience later.  YOU SHOULD EXPECT: Some feelings of bloating in the abdomen. Passage of more gas than usual.  Walking can help get rid of the air that was put into your GI tract during the procedure and reduce the bloating. If you had a lower endoscopy (such as a colonoscopy or flexible sigmoidoscopy) you may notice spotting of blood in your stool or on the toilet paper. If you underwent a bowel prep for your procedure, you may not have a normal bowel movement for a few days.  Please Note:  You might notice some irritation and congestion in your nose or some drainage.  This is from the oxygen used during your procedure.  There is no need for concern and it should clear up in a day or so.  SYMPTOMS TO REPORT IMMEDIATELY:   Following upper endoscopy (EGD)  Vomiting of blood or coffee ground material  New chest pain or pain under the shoulder blades  Painful or persistently difficult swallowing  New shortness of breath  Fever of 100F or higher  Black, tarry-looking stools  For urgent or emergent issues, a gastroenterologist can be reached at any hour by calling [phone]. Do not use MyChart messaging for urgent concerns.    DIET:  We do recommend a small meal at first, but then you may proceed to your regular diet.  Drink plenty of fluids but you should avoid alcoholic beverages for 24 hours.  MEDICATIONS: Continue present medications.  Please see handouts given to you by your recovery nurse.  FOLLOW UP:  Await pathology results.  Thank you for allowing [place] to provide for your healthcare needs today.  ACTIVITY:  You should plan to take it easy for the rest of today and you should NOT DRIVE or use heavy machinery until tomorrow (because of the sedation medicines used during the test).    FOLLOW UP: Our staff will call the number listed on your records the next business day following your procedure.  We will call around 7:15- 8:00 am to check on you and address any questions or concerns that you may have regarding the information given to you following your procedure. If we do not reach you, we will leave a message.     If any biopsies were taken you will be contacted by phone or by letter within the next 1-3 weeks.  Please call us at [phone] if you have not heard about the biopsies in 3 weeks.    SIGNATURES/CONFIDENTIALITY: You and/or your care partner have signed paperwork which will be entered into your electronic medical record.  These signatures attest to the fact that that the information above on your After Visit Summary has been reviewed and is understood.  Full responsibility of the confidentiality of this discharge information lies with you and/or your care-partner.

## 2023-01-15 NOTE — Op Note (Signed)
[facility] Patient Name: Jay Combs Procedure Date: 01/15/2023 7:53 AM MRN: [id number] Endoscopist: Lorin Picket E. Tomasa Rand , MD, [id number] Age: 67 Referring MD:  Date of Birth: 03/29/1955 Gender: Male Account #: [id number] Procedure:                Upper GI endoscopy Indications:              Esophageal reflux symptoms that persist despite                            appropriate therapy Medicines:                Monitored Anesthesia Care Procedure:                Pre-Anesthesia Assessment:                           - Prior to the procedure, a History and Physical                            was performed, and patient medications and                            allergies were reviewed. The patient's tolerance of                            previous anesthesia was also reviewed. The risks                            and benefits of the procedure and the sedation                            options and risks were discussed with the patient.                            All questions were answered, and informed consent                            was obtained. Prior Anticoagulants: The patient has                            taken no anticoagulant or antiplatelet agents. ASA                            Grade Assessment: II - A patient with mild systemic                            disease. After reviewing the risks and benefits,                            the patient was deemed in satisfactory condition to                            undergo the procedure.  After obtaining informed consent, the endoscope was                            passed under direct vision. Throughout the                            procedure, the patient's blood pressure, pulse, and                            oxygen saturations were monitored continuously. The                            Olympus Scope [id number] was introduced through the                            mouth, and advanced to the  third part of duodenum.                            The upper GI endoscopy was accomplished without                            difficulty. The patient tolerated the procedure                            well. Scope In: Scope Out: Findings:                 The examined portions of the nasopharynx,                            oropharynx and larynx were normal.                           The examined esophagus was normal.                           The gastroesophageal flap valve was visualized                            endoscopically and classified as Hill Grade II                            (fold present, opens with respiration).                           Localized mildly erythematous mucosa was found in                            the prepyloric region of the stomach. Biopsies were                            taken with a cold forceps for Helicobacter pylori                            testing. Estimated blood loss was minimal.  Multiple 2 to 7 mm sessile polyps were found in the                            gastric body. Biopsies were taken with a cold                            forceps for histology. Estimated blood loss was                            minimal.                           The exam of the stomach was otherwise normal.                           The examined duodenum was normal. Complications:            No immediate complications. Estimated Blood Loss:     Estimated blood loss was minimal. Impression:               - The examined portions of the nasopharynx,                            oropharynx and larynx were normal.                           - Normal esophagus. No evidence of esophagitis,                            Barrett's esophagus or esophageal stricture                           - Gastroesophageal flap valve classified as Hill                            Grade II (fold present, opens with respiration).                           - Erythematous mucosa  in the prepyloric region of                            the stomach. Biopsied.                           - Multiple gastric polyps. Biopsied.                           - Normal examined duodenum. Recommendation:           - Patient has a contact number available for                            emergencies. The signs and symptoms of potential                            delayed complications were discussed with the  patient. Return to normal activities tomorrow.                            Written discharge instructions were provided to the                            patient.                           - Resume previous diet.                           - Continue present medications.                           - Await pathology results. Kambree Krauss E. Tomasa Rand, MD 01/15/2023 8:13:15 AM This report has been signed electronically.

## 2023-01-15 NOTE — Progress Notes (Signed)
History and Physical Interval Note:  01/15/2023 7:51 AM  Jay Combs  has presented today for endoscopic procedure(s), with the diagnosis of  Encounter Diagnosis  Name Primary?   Gastroesophageal reflux disease with esophagitis, unspecified whether hemorrhage Yes  .  The various methods of evaluation and treatment have been discussed with the patient and/or family. After consideration of risks, benefits and other options for treatment, the patient has consented to  the endoscopic procedure(s).   The patient's history has been reviewed, patient examined, no change in status, stable for endoscopic procedure(s).  I have reviewed the patient's chart and labs.  Questions were answered to the patient's satisfaction.     Keiri Solano E. Tomasa Rand, MD [facility]

## 2023-01-15 NOTE — Progress Notes (Signed)
Called to room to assist during endoscopic procedure.  Patient ID and intended procedure confirmed with present staff. Received instructions for my participation in the procedure from the performing physician.  

## 2023-01-15 NOTE — Progress Notes (Signed)
Sedate, gd SR, tolerated procedure well, VSS, report to RN 

## 2023-01-15 NOTE — Progress Notes (Signed)
Pt's states no medical or surgical changes since previsit or office visit. 

## 2023-01-15 NOTE — Addendum Note (Signed)
Addended by: Leilani Able, Aleysia Oltmann A on: 01/15/2023 08:18 AM   Modules accepted: Orders

## 2023-01-16 ENCOUNTER — Other Ambulatory Visit: Payer: Medicare HMO

## 2023-01-16 ENCOUNTER — Ambulatory Visit
Admission: RE | Admit: 2023-01-16 | Discharge: 2023-01-16 | Disposition: A | Discharge status: Home / Self Care | Payer: Medicare HMO | Source: Ambulatory Visit | Attending: Vascular Surgery | Admitting: Vascular Surgery

## 2023-01-16 ENCOUNTER — Telehealth: Payer: Self-pay | Admitting: *Deleted

## 2023-01-16 DIAGNOSIS — K551 Chronic vascular disorders of intestine: Secondary | ICD-10-CM

## 2023-01-16 DIAGNOSIS — I728 Aneurysm of other specified arteries: Secondary | ICD-10-CM | POA: Diagnosis not present

## 2023-01-16 MED ORDER — IOPAMIDOL (ISOVUE-370) INJECTION 76%
100.0000 mL | Freq: Once | INTRAVENOUS | Status: AC | PRN
  Administered 2023-01-16: 100 mL via INTRAVENOUS

## 2023-01-16 NOTE — Telephone Encounter (Signed)
  Follow up Call-     01/15/2023    7:20 AM 07/23/2020    9:04 AM  Call back number  Post procedure Call Back phone  # [phone]  Permission to leave phone message Yes Yes     Patient questions:  Do you have a fever, pain , or abdominal swelling? No. Pain Score  0 *  Have you tolerated food without any problems? Yes.    Have you been able to return to your normal activities? Yes.    Do you have any questions about your discharge instructions: Diet   No. Medications  No. Follow up visit  No.  Do you have questions or concerns about your Care? No.  Actions: * If pain score is 4 or above: No action needed, pain <4.

## 2023-01-19 LAB — SURGICAL PATHOLOGY

## 2023-01-20 ENCOUNTER — Encounter: Payer: Self-pay | Admitting: Vascular Surgery

## 2023-01-20 NOTE — Progress Notes (Signed)
Jay Combs,  The biopsies taken from your stomach were notable for mild reactive gastropathy which is a very common finding and often related to use of certain medications (usually NSAIDs), but there was no evidence of Helicobacter pylori infection. This common finding is not felt to necessarily be a cause of any particular symptom and there is no specific treatment or further evaluation recommended.  The polyps in your stomach are benign fundic gland polyps.  There was no evidence of infection with Helicobacter pylori or intestinal metaplasia/dysplasia.  These types of polyps are typically secondary to acid suppression therapy and no specific follow-up is required for these small, benign polyps

## 2023-01-24 ENCOUNTER — Encounter: Payer: Self-pay | Admitting: Vascular Surgery

## 2023-02-03 ENCOUNTER — Encounter: Payer: Self-pay | Admitting: Gastroenterology

## 2023-02-05 ENCOUNTER — Encounter: Payer: Self-pay | Admitting: Vascular Surgery

## 2023-02-10 ENCOUNTER — Ambulatory Visit: Payer: Medicare HMO | Admitting: Vascular Surgery

## 2023-02-15 ENCOUNTER — Encounter: Payer: Self-pay | Admitting: Gastroenterology

## 2023-02-17 DIAGNOSIS — L821 Other seborrheic keratosis: Secondary | ICD-10-CM | POA: Diagnosis not present

## 2023-02-17 DIAGNOSIS — L814 Other melanin hyperpigmentation: Secondary | ICD-10-CM | POA: Diagnosis not present

## 2023-02-17 DIAGNOSIS — D225 Melanocytic nevi of trunk: Secondary | ICD-10-CM | POA: Diagnosis not present

## 2023-02-17 DIAGNOSIS — D224 Melanocytic nevi of scalp and neck: Secondary | ICD-10-CM | POA: Diagnosis not present

## 2023-02-17 DIAGNOSIS — D2372 Other benign neoplasm of skin of left lower limb, including hip: Secondary | ICD-10-CM | POA: Diagnosis not present

## 2023-03-17 ENCOUNTER — Ambulatory Visit: Payer: Medicare HMO | Admitting: Family

## 2023-03-17 ENCOUNTER — Encounter: Payer: Self-pay | Admitting: Family

## 2023-03-17 VITALS — BP 112/76 | HR 68 | Temp 97.0°F | Ht 66.0 in | Wt 143.0 lb

## 2023-03-17 DIAGNOSIS — J069 Acute upper respiratory infection, unspecified: Secondary | ICD-10-CM

## 2023-03-17 NOTE — Progress Notes (Signed)
 Patient ID: Jay Combs, male    DOB: [birth year], 68 y.o.   MRN: [id number]  Chief Complaint  Patient presents with   Sinus Problem    Pt c/o Cough with green mucus,  sore throat, eye discharge, Present 10 days. Has tried mucinex which did help.        Discussed the use of AI scribe software for clinical note transcription with the patient, who gave verbal consent to proceed.  History of Present Illness   Jay Combs is a 68 year old male who presents with a 10-day history of cough and phlegm production. He has experienced a 10-day history of cough and phlegm production, with symptoms most severe during the first four to five days, characterized by significant coughing and thick, greenish phlegm. Mucinex provided some relief, especially in the mornings. Over the past three days, phlegm production has decreased significantly, with only a small amount present in the mornings. In recent days, he has developed a sore throat upon waking and puffy, watery eyes. No significant sinus symptoms, nasal congestion, or post-nasal drip. No fever or body aches. He is concerned about the duration of his symptoms due to recent exposure to sick employees, one of whom had the flu and another an upper respiratory infection. He has received a flu shot this year, which reduces the likelihood of influenza. He has not taken any medications aside from Mucinex and prefers to avoid medication unless necessary, being cautious due to his age.     Assessment & Plan:     Upper Respiratory Infection - Symptoms for 10 days with initial cough and phlegm production, now improved. Current symptoms include morning sore throat and eye puffiness. No sinus symptoms. Lungs clear on auscultation. Throat mildly red but not excessively irritated. No lymphadenopathy. -Continue supportive care with Mucinex as needed. -Consider use of a humidifier overnight for throat irritation. -Consider use of Advil or Aleve for anti-inflammatory  effects and symptom relief. -Consider use of nasal saline spray for moisturizing and disinfecting. -Continue monitoring symptoms and contact office if symptoms worsen or fever develops.     Subjective:    Outpatient Medications Prior to Visit  Medication Sig Dispense Refill   Cyanocobalamin  (VITAMIN B-12 PO) Take 1 tablet by mouth 2 (two) times a week.     MAGNESIUM GLUCONATE PO Take 200 mg by mouth daily.     Misc Natural Products (TART CHERRY ADVANCED PO) Take 1 tablet by mouth daily.     Omega-3 Fatty Acids (FISH OIL PO) Take 2 capsules by mouth daily in the afternoon.     omeprazole  (PRILOSEC OTC) 20 MG tablet Take 40 mg by mouth daily.     rosuvastatin  (CRESTOR ) 5 MG tablet Take 1 tablet (5 mg total) by mouth daily. 90 tablet 3   Facility-Administered Medications Prior to Visit  Medication Dose Route Frequency Provider Last Rate Last Admin   0.9 %  sodium chloride  infusion  500 mL Intravenous Once Stacia Glendia BRAVO, MD       Past Medical History:  Diagnosis Date   ALLERGIC RHINITIS 09/25/2006   BRADYCARDIA 09/25/2006   athletic Heart rate   CHEST PAIN UNSPECIFIED 10/28/2007   cardiology evaluation including stress test reassuring   INSOMNIA, CHRONIC 08/09/2008   PALPITATIONS, CHRONIC 10/28/2007   Past Surgical History:  Procedure Laterality Date   COLONOSCOPY  11/09/2008   [facility]-normal   COLONOSCOPY  2022   [facility]-normal GERD   left cataract surgery Left    sept 2024  NASAL SEPTUM SURGERY     UPPER GASTROINTESTINAL ENDOSCOPY     Allergies  Allergen Reactions   Meperidine Other (See Comments)    Decreased BP  REACTION: decreased BP, Decreased BP   Protonix  [Pantoprazole ] Other (See Comments)    Headache      Objective:    Physical Exam Vitals and nursing note reviewed.  Constitutional:      General: He is not in acute distress.    Appearance: Normal appearance.  HENT:     Head: Normocephalic.     Right Ear: Tympanic membrane and ear canal normal.      Left Ear: Tympanic membrane and ear canal normal.     Nose: No congestion or rhinorrhea.     Right Sinus: No frontal sinus tenderness.     Left Sinus: No frontal sinus tenderness.     Mouth/Throat:     Mouth: Mucous membranes are moist.     Pharynx: No pharyngeal swelling, oropharyngeal exudate, posterior oropharyngeal erythema, uvula swelling or postnasal drip.     Tonsils: No tonsillar exudate or tonsillar abscesses.  Cardiovascular:     Rate and Rhythm: Normal rate and regular rhythm.  Pulmonary:     Effort: Pulmonary effort is normal.     Breath sounds: Normal breath sounds.  Musculoskeletal:        General: Normal range of motion.     Cervical back: Normal range of motion.  Lymphadenopathy:     Head:     Right side of head: No tonsillar, preauricular, posterior auricular or occipital adenopathy.     Left side of head: No tonsillar, preauricular, posterior auricular or occipital adenopathy.     Cervical: No cervical adenopathy.     Upper Body:     Right upper body: No supraclavicular adenopathy.     Left upper body: No supraclavicular adenopathy.  Skin:    General: Skin is warm and dry.  Neurological:     Mental Status: He is alert and oriented to person, place, and time.  Psychiatric:        Mood and Affect: Mood normal.    BP 112/76 (BP Location: Left Arm, Patient Position: Sitting, Cuff Size: Small)   Pulse 68   Temp (!) 97 F (36.1 C) (Temporal)   Ht 5' 6 (1.676 m)   Wt 143 lb (64.9 kg)   SpO2 98%   BMI 23.08 kg/m  Wt Readings from Last 3 Encounters:  03/17/23 143 lb (64.9 kg)  01/15/23 143 lb (64.9 kg)  01/06/23 144 lb 14.4 oz (65.7 kg)       Lucius Krabbe, NP

## 2023-03-18 ENCOUNTER — Ambulatory Visit (INDEPENDENT_AMBULATORY_CARE_PROVIDER_SITE_OTHER): Payer: Medicare HMO | Admitting: Vascular Surgery

## 2023-03-18 ENCOUNTER — Encounter: Payer: Self-pay | Admitting: Vascular Surgery

## 2023-03-18 VITALS — BP 118/79 | HR 63 | Temp 98.1°F | Resp 20 | Ht 66.0 in | Wt 144.0 lb

## 2023-03-18 DIAGNOSIS — K551 Chronic vascular disorders of intestine: Secondary | ICD-10-CM
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
## 2023-03-19 DIAGNOSIS — H35431 Paving stone degeneration of retina, right eye: Secondary | ICD-10-CM | POA: Diagnosis not present

## 2023-03-19 DIAGNOSIS — H35423 Microcystoid degeneration of retina, bilateral: Secondary | ICD-10-CM | POA: Diagnosis not present

## 2023-03-19 DIAGNOSIS — H35342 Macular cyst, hole, or pseudohole, left eye: Secondary | ICD-10-CM | POA: Diagnosis not present

## 2023-03-30 ENCOUNTER — Other Ambulatory Visit: Payer: Self-pay

## 2023-03-30 DIAGNOSIS — K551 Chronic vascular disorders of intestine: Secondary | ICD-10-CM

## 2023-03-31 ENCOUNTER — Encounter: Payer: Self-pay | Admitting: Vascular Surgery

## 2023-05-19 ENCOUNTER — Encounter: Payer: Self-pay | Admitting: Vascular Surgery

## 2023-05-19 DIAGNOSIS — H2511 Age-related nuclear cataract, right eye: Secondary | ICD-10-CM | POA: Diagnosis not present

## 2023-05-19 DIAGNOSIS — Z961 Presence of intraocular lens: Secondary | ICD-10-CM | POA: Diagnosis not present

## 2023-05-19 DIAGNOSIS — H18413 Arcus senilis, bilateral: Secondary | ICD-10-CM | POA: Diagnosis not present

## 2023-05-19 DIAGNOSIS — H26492 Other secondary cataract, left eye: Secondary | ICD-10-CM | POA: Diagnosis not present

## 2023-05-20 ENCOUNTER — Encounter: Payer: Self-pay | Admitting: Family Medicine

## 2023-05-22 ENCOUNTER — Ambulatory Visit (INDEPENDENT_AMBULATORY_CARE_PROVIDER_SITE_OTHER): Admitting: Family Medicine

## 2023-05-22 ENCOUNTER — Encounter: Payer: Self-pay | Admitting: Family Medicine

## 2023-05-22 VITALS — BP 98/60 | HR 81 | Temp 97.7°F | Ht 66.0 in | Wt 136.4 lb

## 2023-05-22 DIAGNOSIS — E785 Hyperlipidemia, unspecified: Secondary | ICD-10-CM

## 2023-05-22 DIAGNOSIS — R002 Palpitations: Secondary | ICD-10-CM

## 2023-05-22 DIAGNOSIS — I700 Atherosclerosis of aorta: Secondary | ICD-10-CM

## 2023-05-22 NOTE — Progress Notes (Signed)
 Phone [phone] In person visit   Subjective:   Jay Combs is a 68 y.o. year old very pleasant male patient who presents for/with See problem oriented charting Chief Complaint  Patient presents with   muscle soreness    Pt c/o muscle soreness and weakness due to statin.    Past Medical History-  Patient Active Problem List   Diagnosis Date Noted   Aortic atherosclerosis (HCC) 04/15/2021    Priority: High   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HCC) 08/20/2020    Priority: High   Vitamin D deficiency 09/26/2021    Priority: Medium    Lumbar radiculopathy 12/15/2018    Priority: Medium    Anxiety 08/12/2017    Priority: Medium    GERD (gastroesophageal reflux disease) 01/12/2017    Priority: Medium    Hyperlipidemia 01/12/2017    Priority: Medium    Ptosis, right 01/12/2017    Priority: Medium    Macular pucker of both eyes 01/12/2017    Priority: Low   PALPITATIONS, CHRONIC 10/28/2007    Priority: Low   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     MAGNESIUM GLUCONATE PO Take 200 mg by mouth daily.     Misc Natural Products (TART CHERRY ADVANCED PO) Take 1 tablet by mouth daily.     Omega-3 Fatty Acids (FISH OIL PO) Take 2 capsules by mouth daily in the afternoon.     omeprazole (PRILOSEC OTC) 20 MG tablet Take 40 mg by mouth daily.     rosuvastatin (CRESTOR) 5 MG tablet Take 1 tablet (5 mg total) by mouth daily. (Patient not taking: Reported on 05/22/2023) 90 tablet 3   Current Facility-Administered Medications  Medication Dose Route Frequency Provider Last Rate Last Admin   0.9 %  sodium chloride infusion  500 mL Intravenous Once Jenel Lucks, MD          Objective:  BP 98/60   Pulse 81   Temp 97.7 F (36.5 C)   Ht 5\' 6"  (1.676 m)   Wt 136 lb 6.4 oz (61.9 kg)   SpO2 98%   BMI 22.02 kg/m  Gen: NAD, resting comfortably CV: RRR no murmurs rubs or gallops Lungs: CTAB no crackles, wheeze, rhonchi Ext: no edema Skin: warm, dry     Assessment and Plan   #hyperlipidemia #aortic atherosclerosis- but with ct cardiac scoring 0 on 08/20/20 S: Medication:Rosuvastatin 5 mg daily (but holding last today) after sma aneurysm found with partial thrombosis, omega 3 fatty acids- cut down from 2 to 1 while on statin.   -noting a fair amount of muscle soreness particularly in the thighs while on statin. Worse with exercise -Dr. Randie Heinz was okay on 05/19/2023 with patient holding statin until next visit through MyChart -has considered coq10-small chance of contributing to reflux -eats very clean at baseline- salad, yogurt- plant based Lab Results  Component Value Date   CHOL 193 12/19/2022   HDL 69.30 12/19/2022   LDLCALC 113 (H) 12/19/2022   LDLDIRECT 117.7 11/19/2009   TRIG 52.0 12/19/2022   CHOLHDL 3 12/19/2022    A/P: Lipids hopefully improved from previous (eating very clean and have been on statin until 2 days ago)-was not on statin at  last lipids but unfortunately having significant myalgias.  He would like to remain off statin until [place] Medicare visit and repeat lipids at that time-I think it is reasonable.  He also wants to start co-Q10 and potentially restart statin even if just once a week or up to every other day after next visit-also reasonable Aortic atherosclerosis (presumed stable)- LDL goal ideally <70-recheck at next visit and remain off statin for now  #Heart racing/palpitations/nonexertional chest pain S: patient has seen Dr. Eden Emms in past- has had echocardiograms - was having kardia mobile and checked this while actively having palpitations April 9th and NSR was noted. He has bene under intense stress unfortunately with  son with colon cancer. Undergoing chemo  A/P: Palpitations seem to be stress related-monitoring he has done at home appears to suggest noncardiac cause/likely stress related.  Can certainly follow-up with Dr. Cyndie Chime if desired  Recommended follow up: Return for next already scheduled visit or sooner if needed. Future Appointments  Date Time Provider Department Center  06/24/2023  8:00 AM Shelva Majestic, MD [facility]  11/04/2023  8:00 AM [facility]  11/04/2023  8:40 AM Maeola Harman, MD [facility]  12/21/2023  8:00 AM Durene Cal Aldine Contes, MD [facility]    Lab/Order associations:   ICD-10-CM   1. Hyperlipidemia, unspecified hyperlipidemia type  E78.5     2. Aortic atherosclerosis (HCC)  I70.0     3. PALPITATIONS, CHRONIC  R00.2       Time Spent: 31 minutes of total time (8:40 AM- 9:11 AM) was spent on the date of the encounter performing the following actions: chart review prior to seeing the patient, obtaining history, performing a medically necessary exam, counseling on the treatment plan as well as benefits and risks of statins versus nonstatin's, and documenting in our EHR.    Return precautions advised.  Tana Conch, MD

## 2023-05-22 NOTE — Patient Instructions (Addendum)
 Hold statin for now- recheck lipids/Low Density Lipoprotein (LDL cholesterol) next visit  Ok to take coq10- that may help when we restart statin potentially  Recommended follow up: Return for next already scheduled visit or sooner if needed.

## 2023-06-22 ENCOUNTER — Telehealth: Payer: Self-pay | Admitting: Family Medicine

## 2023-06-22 NOTE — Telephone Encounter (Unsigned)
 Copied from CRM [id number]. Topic: Appointments - Scheduling Inquiry for Clinic >> Jun 22, 2023 10:21 AM Danae Duncans wrote: Reason for CRM: Pt would like to reschedule May 14 appt to May 20- however it not allowing me to reschedule, pt can be reach [phone]

## 2023-06-23 ENCOUNTER — Telehealth: Payer: Self-pay

## 2023-06-23 NOTE — Telephone Encounter (Signed)
 See below.  Copied from CRM [id number]. Topic: Appointments - Scheduling Inquiry for Clinic >> Jun 22, 2023 10:21 AM Danae Duncans wrote: Reason for CRM: Pt would like to reschedule May 14 appt to May 20- however it not allowing me to reschedule, pt can be reach [phone] >> Jun 23, 2023  9:49 AM [place] J wrote: Patient called in and decided to keep his physical for November 11th at 8:00 AM instead of switching it to a "[facility] [employer]" visit type. I let him know to just call his insurance company so we can ensure he won't get billed incorrectly.

## 2023-06-24 ENCOUNTER — Encounter: Payer: Medicare HMO | Admitting: Family Medicine

## 2023-07-09 ENCOUNTER — Encounter: Payer: Self-pay | Admitting: Family Medicine

## 2023-07-13 ENCOUNTER — Ambulatory Visit: Admitting: Family Medicine

## 2023-07-13 ENCOUNTER — Encounter: Payer: Self-pay | Admitting: Family Medicine

## 2023-07-13 ENCOUNTER — Ambulatory Visit (INDEPENDENT_AMBULATORY_CARE_PROVIDER_SITE_OTHER): Admitting: Family Medicine

## 2023-07-13 VITALS — BP 84/70 | HR 69 | Temp 97.1°F | Ht 66.0 in | Wt 138.4 lb

## 2023-07-13 DIAGNOSIS — I719 Aortic aneurysm of unspecified site, without rupture: Secondary | ICD-10-CM

## 2023-07-13 DIAGNOSIS — R7982 Elevated C-reactive protein (CRP): Secondary | ICD-10-CM

## 2023-07-13 DIAGNOSIS — E785 Hyperlipidemia, unspecified: Secondary | ICD-10-CM | POA: Diagnosis not present

## 2023-07-13 DIAGNOSIS — I700 Atherosclerosis of aorta: Secondary | ICD-10-CM

## 2023-07-13 NOTE — Progress Notes (Signed)
 Phone [phone] In person visit   Subjective:   Jay Combs is a 68 y.o. 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    Priority: High   Vitamin D  deficiency 09/26/2021    Priority: Medium    Lumbar radiculopathy 12/15/2018    Priority: Medium    Anxiety 08/12/2017    Priority: Medium    GERD (gastroesophageal reflux disease) 01/12/2017    Priority: Medium    Hyperlipidemia 01/12/2017    Priority: Medium    Ptosis, right 01/12/2017    Priority: Medium    Macular pucker of both eyes 01/12/2017    Priority: Low   PALPITATIONS, CHRONIC 10/28/2007    Priority: Low   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     MAGNESIUM GLUCONATE PO Take 200 mg by mouth daily.     Misc Natural Products (TART CHERRY ADVANCED PO) Take 1 tablet by mouth daily.     Omega-3 Fatty Acids (FISH OIL PO) Take 2 capsules by mouth daily in the afternoon.     omeprazole  (PRILOSEC OTC) 20 MG tablet Take 40 mg by mouth daily.     rosuvastatin  (CRESTOR ) 5 MG tablet Take 1 tablet (5 mg total) by mouth daily. 90 tablet 3   Current Facility-Administered Medications  Medication Dose Route Frequency Provider Last Rate Last Admin   0.9 %  sodium chloride  infusion  500 mL Intravenous Once Elois Hair, MD         Objective:  BP (!) 84/70   Pulse 69   Temp (!) 97.1 F (36.2 C)   Ht 5\' 6"  (1.676 m)   Wt 138  lb 6.4 oz (62.8 kg)   SpO2 97%   BMI 22.34 kg/m  Gen: NAD, resting comfortably     Assessment and Plan     # Lab review.  Patient schedule visit discussed recent labs from [facility] which provides 99 different biomarkers.  Of particular interest that he wanted to discuss "- LDL:107 - High sensitivity C reactive:1.2mg /L - Lipoprotein A: 12nmo/L. - Apolipoprotein B: 88mg /dL - HDL: 86mg /dL - Non-HDL cholesterol:121mg /dL - Total cholesterol:207mg /dL - Total cholesterol/HDL ratio:2.4 - Triglycerides:58mg /Dl - PSA%, free 09% - PSA free:0.2ng/mL - free Testosterone :158.2pg/mL - Hemoglobin A1c:5.7%" -optimal range 1.2 for hsCRP listed - optimal range for apoB was 40-70.  -free testosterone  range 150-224 pg/ml     #hyperlipidemia #aortic atherosclerosis- but with ct cardiac scoring 0 on 08/20/20 S: Medication: None at present - Rosuvastatin  5 mg daily after aneurysm found, omega 3 fatty acids in the past.   -Dr. Vikki Graves was okay on 05/19/2023 with patient holding statin until next visit.  Patient wrote me on 05/20/2023 and I agreed with his plan as he reported soreness and weakness in his muscles.  He also asked about coenzyme q10but could contribute to reflux potentially.  Was already eating very clean at baseline-salad, yogurt-mainly plant-based still- twice a month  red meat As we discussed reassuring trends and lipoprotein a, HDL, non-HDL cholesterol, cholesterol to HDL ratio  Lab Results  Component Value Date   CHOL 193 12/19/2022   HDL 69.30 12/19/2022   LDLCALC 113 (H) 12/19/2022   LDLDIRECT 117.7 11/19/2009   TRIG 52.0 12/19/2022   CHOLHDL 3 12/19/2022    A/P: Mildly elevated lipids especially in light of aortic atherosclerosis with preferred LDL under 70 and patient wants to hold off on statin and has had statin myalgias-We discussed the main thrust for mildly elevated high-sensitivity CRP, mildly high apolipoprotein B, elevated LDL would be consideration of statin but he  wants to hold off on this for now.  Could also move towards a vegan diet but he declines that at this time -His CT calcium  scoring has been reassuring and we can repeat in 2027 - He may try to work on meditation and yoga to bring stress levels down - With high-sensitivity CRP being mildly high he asks about doing an open parasite testing-I thought this would be low yield as he is rather low risk and we are unsure of the cost-he ultimately opted out -Also has atherosclerotic ulcer of the aorta and for that reason would recommend statin or vegan diet-declines for now  # Prostate cancer screening-slight increase in PSA last year but overall trend has been low risk.  PSA level suggesting low risk. Free testosterone  was in normal range on discussion.  His PSA trend has been low risk for prostate cancer but there is some mildly elevated increased risk with the free testosterone  percentage-recommended rechecking PSA at his physical in 6 months and he agrees Lab Results  Component Value Date   PSA 1.57 12/19/2022   PSA 1.19 09/26/2021   PSA 1.31 07/05/2020     # Hyperglycemia/insulin resistance/prediabetes-reports most recent A1c at 5.7 with outside lab S:  Medication: None Exercise and diet- low sugar intake- occasional dark chocolate no sugar, exercises regularly- 4x a week for at least 45 minutes to an hour Lab Results  Component Value Date   HGBA1C 5.4 05/05/2018    A/P: One of his biggest concerns today was the mildly elevated increase diabetes risk of 5.7-I do not think there is a high yield for glucose tolerance testing and that is also not easy for us  to access.  Would repeat A1c at upcoming visit  # Low acceptable blood pressure-no reported symptoms but need to recheck at follow-up   Recommended follow up: Keep next already scheduled visit Future Appointments  Date Time Provider Department Center  08/25/2023  2:00 PM Almira Jaeger, MD [facility]  11/04/2023  8:00 AM [facility]  11/04/2023  8:40 AM Adine Hoof, MD [facility]  12/21/2023  8:00 AM Almira Jaeger, MD [facility]    Lab/Order associations:   ICD-10-CM   1. Hyperlipidemia, unspecified hyperlipidemia type  E78.5     2. Elevated C-reactive protein (CRP)  R79.82     3. Aortic atherosclerosis (HCC)  I70.0     4. Atherosclerotic ulcer of aorta (HCC)  I70.0    I71.9        Time Spent: 45 minutes of total time (4 PM-4:45 PM) was spent on the date of the encounter performing the following actions: Brief chart review prior to seeing the patient, obtaining history, counseling on significance of labs and potential treatment plan,  and documenting in our EHR.    Return precautions advised.  Clarisa Crooked, MD

## 2023-07-13 NOTE — Patient Instructions (Addendum)
 Health Maintenance Due  Topic Date Due   Medicare Annual Wellness (AWV)  Never done   Recommended considering vegan diet or at least shifting to more plant-based diet -Meditation may also be helpful Recommended follow up: Return for next already scheduled visit or sooner if needed.

## 2023-07-20 ENCOUNTER — Encounter: Payer: Self-pay | Admitting: Family Medicine

## 2023-08-25 ENCOUNTER — Encounter: Payer: Self-pay | Admitting: Family Medicine

## 2023-08-25 ENCOUNTER — Ambulatory Visit: Admitting: Family Medicine

## 2023-08-25 VITALS — BP 102/70 | HR 66 | Temp 97.9°F | Ht 66.0 in | Wt 141.0 lb

## 2023-08-25 DIAGNOSIS — Z0000 Encounter for general adult medical examination without abnormal findings: Secondary | ICD-10-CM | POA: Diagnosis not present

## 2023-08-25 NOTE — Patient Instructions (Addendum)
 EKG today looks completely normal   Jay Combs , Thank you for taking time to come for your Medicare Wellness Visit. I appreciate your ongoing commitment to your health goals. Please review the following plan we discussed and let me know if I can assist you in the future.   These are the goals we discussed: Goal 150 minutes exercise per week- perhaps add one day a week  This is a list of the screening recommended for you and due dates:  Health Maintenance  Topic Date Due   COVID-19 Vaccine (4 - 2024-25 season) 09/10/2023*   Pneumococcal Vaccine for age over 62 (1 of 1 - PCV) 03/16/2024*   Hepatitis C Screening  02/10/2073*   Flu Shot  09/11/2023   Medicare Annual Wellness Visit  08/24/2024   DTaP/Tdap/Td vaccine (3 - Td or Tdap) 10/15/2025   Colon Cancer Screening  04/21/2027   HIV Screening  Completed   Zoster (Shingles) Vaccine  Completed   Hepatitis B Vaccine  Aged Out   HPV Vaccine  Aged Out   Meningitis B Vaccine  Aged Out  *Topic was postponed. The date shown is not the original due date.   Recommended follow up: Return for next already scheduled visit or sooner if needed.

## 2023-08-25 NOTE — Progress Notes (Signed)
 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 Some intermittent strength training as well- trying to work on this Diet: whole food plant based diet but includes lean meats- red meat maybe once or twice a month  Wt Readings from Last 3 Encounters:  08/25/23 141 lb (64 kg)  07/13/23 138 lb 6.4 oz (62.8 kg)  05/22/23 136 lb 6.4 oz (61.9 kg)   Smoking Status: Never Smoker Second Hand Smoking status: No smokers in home Alcohol intake: 5 glasses of wine per week- usually over 3-4 days Other substance abuse/illicit drugs: none  Cardiac risk factors:  advanced age (older than 76 for men, 55 for women)  known Hyperlipidemia  no Hypertension  No diabetes did have elevated a1c most recent functional health assessment Lab Results  Component Value Date   HGBA1C 5.4 05/05/2018   Family History: paternal grandfather possible heart issues younger than 59  Family History  Problem Relation Age of Onset   Aortic aneurysm Mother        never smoker   Breast cancer Mother        stage I- mastectomy   Cervical cancer Mother        ? uterine.    Cancer Mother    Lung cancer Father        smoker. chemo, radiation. surgery not available- spots shrunk some. depressed mood   Pulmonary fibrosis Father        passed at 37   Cancer Father    Scoliosis Sister        also some head trauma after abuse from spouse   Other Son        X-linked gammaglobulinemia. monthly infusions   Healthy Son     Other Maternal Grandmother        unilateral kidney but lived to 71   Heart attack Paternal Grandfather        younger than 41- but patients dad has not had issues   Colon cancer Neg Hx    Colon polyps Neg Hx    Esophageal cancer Neg Hx    Rectal cancer Neg Hx    Stomach cancer Neg Hx     Depression Screen/risk evaluation Risk factors: anxiety isuses at times. PHQ9 of 0     08/25/2023    2:19 PM 12/19/2022    8:51 AM 06/19/2022    7:26 AM 09/26/2021    7:56 AM 07/05/2020    8:20 AM  Depression screen PHQ 2/9  Decreased Interest 0 0 0 0 0  Down, Depressed, Hopeless 0 0 0 0 0  PHQ - 2 Score 0 0 0 0 0  Altered sleeping 0 0  0 1  Tired, decreased energy 0 0  0 0  Change in appetite 0 0  0 0  Feeling bad or failure about yourself  0 0  0 0  Trouble concentrating 0 0  0 0  Moving slowly or fidgety/restless 0 0  0 0  Suicidal thoughts 0 0  0 0  PHQ-9 Score 0 0  0 1  Difficult doing work/chores Not difficult at all Not difficult at all  Not difficult at all Not difficult at all    Functional ability and level of safety Mobility assessment:  timed get up and go <12 seconds Activities of Daily Living- Independent in ADLs (toileting, bathing, dressing, transferring, eating) and in IADLs (shopping, housekeeping, managing own medications, and handling finances) Home Safety: Loose rugs (none), smoke detectors (up to date ), small pets (none), grab bars (does not have yet- no issues), stairs (12 steps but no issues), life-alert system (would use phone- keeps close) Hearing Difficulties: -patient declines Fall Risk: None     12/19/2022    8:50 AM 06/19/2022    7:26 AM 07/05/2020    8:20 AM 05/18/2020    1:01 PM 01/12/2017    8:03 AM  Fall Risk   Falls in the past year? 0 0 0 0 No   Number falls in past yr: 0 0 0 0   Injury with Fall? 0 0 0 0   Risk for fall due to :  No Fall Risks        Data saved with a previous flowsheet row definition   Opioid use history:  no long term opioids  use Self assessment of health status: fair at the moment- he worries abotu cholesterol  Required Immunizations needed today:  opts out COVID shots and Prevnar 20, otherwise up to date . Has opted out of HCV screen Immunization History  Administered Date(s) Administered   Fluad Trivalent(High Dose 65+) 12/19/2022   Influenza Inj Mdck Quad Pf 11/14/2018   Influenza Split 12/09/2010   Influenza Whole 02/10/2005, 12/08/2006, 10/28/2007, 11/24/2008, 11/19/2009   Influenza,inj,Quad PF,6+ Mos 12/05/2013, 12/08/2014, 10/16/2015, 12/01/2016   Influenza-Unspecified 11/14/2018, 11/22/2019, 12/18/2020, 12/10/2021   PFIZER(Purple Top)SARS-COV-2 Vaccination 04/11/2019, 05/11/2019, 02/18/2020   Td 02/10/2005   Tdap 10/16/2015   Zoster Recombinant(Shingrix ) 06/15/2017, 08/31/2017   Zoster, Live 10/06/2012   Health Maintenance  Topic Date Due   COVID-19 Vaccine (4 - 2024-25 season) 09/10/2023*   Pneumococcal Vaccine for age over 88 (1 of 1 - PCV) 03/16/2024*   Hepatitis C Screening  02/10/2073*   Flu Shot  09/11/2023   Medicare Annual Wellness Visit  08/24/2024   DTaP/Tdap/Td vaccine (3 - Td or Tdap) 10/15/2025   Colon Cancer Screening  04/21/2027   HIV Screening  Completed   Zoster (Shingles) Vaccine  Completed   Hepatitis B Vaccine  Aged Out   HPV Vaccine  Aged Out   Meningitis B Vaccine  Aged Out  *Topic was postponed. The date shown is not the original due date.    Screening tests-  Colon cancer screening- 04/20/20 with 7 year repeat Lung Cancer screening- never smoked so not candidate Skin cancer screening- yearly dermatology checks Prostate cancer screening- PSA trend overall low risk- monitoring annually plus had function health assessment. Will pay special attention of free % at follow up  Lab Results  Component Value Date   PSA 1.57 12/19/2022   PSA 1.19 09/26/2021   PSA 1.31 07/05/2020   The following were reviewed and entered/updated in epic: Past Medical History:  Diagnosis  Date   ALLERGIC RHINITIS 09/25/2006   Anxiety On and off   BRADYCARDIA 09/25/2006   athletic Heart rate   CHEST PAIN UNSPECIFIED 10/28/2007   cardiology evaluation including stress test reassuring   Depression On and off   GERD (gastroesophageal reflux disease) 5 years ago   INSOMNIA, CHRONIC 08/09/2008   PALPITATIONS, CHRONIC 10/28/2007   Patient Active  Problem List   Diagnosis Date Noted   Aortic atherosclerosis (HCC) 04/15/2021    Priority: High   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HCC) 08/20/2020    Priority: High   Vitamin D  deficiency 09/26/2021    Priority: Medium    Lumbar radiculopathy 12/15/2018    Priority: Medium    Anxiety 08/12/2017    Priority: Medium    GERD (gastroesophageal reflux disease) 01/12/2017    Priority: Medium    Hyperlipidemia 01/12/2017    Priority: Medium    Ptosis, right 01/12/2017    Priority: Medium    Macular pucker of both eyes 01/12/2017    Priority: Low   PALPITATIONS, CHRONIC 10/28/2007    Priority: Low   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 uterine.    Cancer Mother    Lung cancer Father        smoker. chemo, radiation. surgery not available- spots shrunk some. depressed mood   Pulmonary fibrosis Father        passed at 9   Cancer Father    Scoliosis Sister        also some head trauma after abuse from spouse   Other Son        X-linked gammaglobulinemia. monthly infusions    Healthy Son    Other Maternal Grandmother        unilateral kidney but lived to 24   Heart attack Paternal Grandfather        younger than 51- but patients dad has not had issues   Colon cancer Neg Hx    Colon polyps Neg Hx    Esophageal cancer Neg Hx    Rectal cancer Neg Hx    Stomach cancer Neg Hx     Medications- reviewed and updated Current Outpatient Medications  Medication Sig Dispense Refill   Cyanocobalamin  (VITAMIN B-12 PO) Take 1 tablet by mouth 2 (two) times a week.     MAGNESIUM GLUCONATE PO Take 200 mg by mouth daily.     Misc Natural Products (TART CHERRY ADVANCED PO) Take 1 tablet by mouth daily.     Omega-3 Fatty Acids (FISH OIL PO) Take 2 capsules by mouth daily in the afternoon.     omeprazole  (PRILOSEC OTC) 20 MG tablet Take 40 mg by mouth daily.     rosuvastatin  (CRESTOR ) 5 MG tablet Take 1 tablet (5 mg total) by mouth daily. 90 tablet 3   Current Facility-Administered Medications  Medication Dose Route Frequency Provider Last Rate Last Admin   0.9 %  sodium chloride  infusion  500 mL Intravenous Once Stacia Glendia BRAVO, MD        Allergies-reviewed and updated Allergies  Allergen Reactions   Meperidine Other (See Comments)    Decreased BP  REACTION: decreased BP, Decreased BP  Protonix  [Pantoprazole ] Other (See Comments)    Headache    Social History   Socioeconomic History   Marital status: Married    Spouse name: Not on file   Number of children: 2   Years of education: Not on file   Highest education level: Master's degree (e.g., MA, MS, MEng, MEd, MSW, MBA)  Occupational History   Occupation: Administrator, Civil Service: [employer]  Tobacco Use   Smoking status: Never   Smokeless tobacco: Never  Vaping Use   Vaping status: Never Used  Substance and Sexual Activity   Alcohol use: Yes    Alcohol/week: 7.0 standard drinks of alcohol    Types: 7 Standard drinks or equivalent per week   Drug use: No   Sexual activity: Not on file   Other Topics Concern   Not on file  Social History Narrative   Married. 2 sons 30 and 27 in 2018. No grandkids yet. Has some grandcats. 1 son with colon cancer in January 2025.         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 Started with a friend- they bought together. January 2020.    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        Hobbies: reading, time with family, not a big sports person   Social Drivers of Health- updated 08/25/23 and no change   Financial Resource Strain: Low Risk  (05/21/2023)   Overall Financial Resource Strain (CARDIA)    Difficulty of Paying Living Expenses: Not hard at all  Food Insecurity: No Food Insecurity (05/21/2023)   Hunger Vital Sign    Worried About Running Out of Food in the Last Year: Never true    Ran Out of Food in the Last Year: Never true  Transportation Needs: No Transportation Needs (05/21/2023)   PRAPARE - Administrator, Civil Service (Medical): No    Lack of Transportation (Non-Medical): No  Physical Activity: adequate   Exercise Vital Sign    Days of Exercise per Week: 4 days    Minutes of Exercise per Session: 35 minutes- so close to 150 minutes a week  Stress: Stress Concern Present (05/21/2023)   [employer] of Occupational Health - Occupational Stress Questionnaire    Feeling of Stress : To some extent- with son's situation and business  Social Connections: Socially Integrated (05/21/2023)   Social Connection and Isolation Panel    Frequency of Communication with Friends and Family: Twice a week    Frequency of Social Gatherings with Friends and Family: Once a week    Attends Religious Services: More than 4 times per year    Active Member of [employer] or Organizations: Yes    Attends Engineer, structural: More than 4 times per year    Marital Status: Married    Objective  Objective:  BP 102/70   Pulse 66   Temp 97.9 F (36.6 C)    Ht 5' 6 (1.676 m)   Wt 141 lb (64 kg)   SpO2 97%   BMI 22.76 kg/m  Gen: NAD, resting comfortably HEENT: Mucous membranes are moist. Oropharynx normal Neck: no thyromegaly CV: RRR no murmurs rubs or gallops Lungs: CTAB no crackles, wheeze, rhonchi Abdomen: soft/nontender/nondistended/normal bowel sounds. No rebound or guarding.  Ext: no edema Skin: warm, dry Neuro: grossly normal, moves all extremities, PERRLA   EKG: normal sinus rhythm with rate 60, normal  axis, normal intervals, no hypertrophy, no st or t wave changes    Assessment and Plan:   Welcome to Medicare exam completed-  Educated, counseled and referred based on above elements Educated, counseled and referred as appropriate for preventative needs Discussed and documented a written plan for preventiative services and screenings with personalized health advice- After Visit Summary was given to patient which included this plan  4. EKG offered [id number]- patient opts in   Status of chronic or acute concerns   #Thoracic aortic aneurysm followed by Dr. 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 -thankfully blood pressure very well controlled - considering statin   #hyperlipidemia #aortic atherosclerosis- but with ct cardiac scoring 0 on 08/20/20 S: Medication:Rosuvastatin  5 mg daily after aneurysm found, omega 3 fatty acids- he has opted to try every other  day  as had soreness and weakness on daily- he has not started this yet -declines vegan diet -Dr. Sheree was okay on 05/19/2023 with patient holding statin until next visit  A/P: is going to get another round of labs in about 60 days through function [facility]- wants to see where things stand before deciding on every other day dosing -ideally with aortic atherosclerosis would get LDL under 70  #palpitations #Low normal B12- have recommended patient take B12 supplements once a week keep levels over 400 - he tis taking daily November 2024 Lab Results  Component Value Date   VITAMINB12 620 12/19/2022   #Gerd- on dexilant  60 mg through Dr. Aneita- last visit 10/01/20 - does 40 mg omeprazole  in am-purchases otc -presented with Teeth decay-dentist has thought he could potentially have acid reflux or sleep apnea- endoscopy 01/15/23 with Dr. Stacia  #Heart racing/palpitations/nonexertional chest pain- prior stress tests and visits with Dr. Delford. He has continued to note some palpitations- - sometimes feels like heart is beating more quickly but checks watch and usually max 80 or 90- also feels slightly heavier breathing with it at times. Getting updated EKG today to assess for change - could be stress related  #Vitamin D  deficiency- as low as 17 in 2020 S: Medication:  1000 units per day Last vitamin D  Lab Results  Component Value Date   VD25OH 24.75 (L) 12/19/2022  A/P: reports improved with [facility]- we will look for with next assessment    # Hyperglycemia/insulin resistance/prediabetes- peak a1c 5.7 on biomarker testing from home. Already eats relatively clean diet- may try to boost exercise to 5 days a week  Recommended follow up: keep physical in november Future Appointments  Date Time Provider Department Center  11/04/2023  8:00 AM [facility]  11/04/2023  8:40 AM Sheree Penne Bruckner, MD [facility]  12/24/2023  9:00 AM Katrinka Garnette KIDD, MD [facility]      Lab/Order associations:   ICD-10-CM   1. Welcome to Medicare preventive visit  Z00.00       No orders of the defined types were placed in  this encounter.   Return precautions advised. Garnette Lukes, MD

## 2023-09-02 ENCOUNTER — Encounter: Payer: Self-pay | Admitting: Pharmacist

## 2023-09-02 NOTE — Progress Notes (Signed)
 Pharmacy Quality Measure Review  This patient is appearing on a report for being at risk of failing the adherence measure for cholesterol (statin) medications this calendar year.   Medication: rosuvastatin   Last fill date: 04/03/2023 for 90 day supply  Patient developed leg pain when he started rosuvastatin . LDL has remained above goal of < 70. He has discussed possibly adding CoEnzyme Q10 and alternate day dosing for rosuvastatin  to mitigate / lessen myalgias. Dr Carolee plan is to hold off on statin until he sees Dr Sheree again 11/04/2023 and then decide if he will restart rosuvastatin  5mg  every OTHER day or try a different lipid lowering medication.   Madelin Ray, PharmD Clinical Pharmacist [facility]  Population Health [phone]

## 2023-09-03 ENCOUNTER — Encounter: Payer: Self-pay | Admitting: Family Medicine

## 2023-09-03 DIAGNOSIS — I712 Thoracic aortic aneurysm, without rupture, unspecified: Secondary | ICD-10-CM

## 2023-09-03 DIAGNOSIS — I719 Aortic aneurysm of unspecified site, without rupture: Secondary | ICD-10-CM

## 2023-09-04 NOTE — Telephone Encounter (Signed)
Please review patient message

## 2023-09-24 ENCOUNTER — Other Ambulatory Visit: Payer: Self-pay

## 2023-09-24 DIAGNOSIS — K551 Chronic vascular disorders of intestine: Secondary | ICD-10-CM

## 2023-10-01 NOTE — Telephone Encounter (Signed)
 Thank you :)

## 2023-10-09 ENCOUNTER — Encounter: Payer: Self-pay | Admitting: Vascular Surgery

## 2023-10-21 ENCOUNTER — Encounter: Payer: Self-pay | Admitting: Family Medicine

## 2023-10-26 ENCOUNTER — Encounter: Payer: Self-pay | Admitting: Family Medicine

## 2023-10-27 ENCOUNTER — Other Ambulatory Visit: Payer: Self-pay | Admitting: Family Medicine

## 2023-10-27 DIAGNOSIS — I719 Aortic aneurysm of unspecified site, without rupture: Secondary | ICD-10-CM

## 2023-10-29 DIAGNOSIS — I728 Aneurysm of other specified arteries: Secondary | ICD-10-CM | POA: Diagnosis not present

## 2023-11-04 ENCOUNTER — Ambulatory Visit (HOSPITAL_COMMUNITY)
Admission: RE | Admit: 2023-11-04 | Discharge: 2023-11-04 | Disposition: A | Discharge status: Home / Self Care | Payer: Medicare HMO | Source: Ambulatory Visit | Attending: Vascular Surgery | Admitting: Vascular Surgery

## 2023-11-04 ENCOUNTER — Encounter: Payer: Self-pay | Admitting: Vascular Surgery

## 2023-11-04 ENCOUNTER — Ambulatory Visit: Payer: Medicare HMO | Admitting: Vascular Surgery

## 2023-11-04 VITALS — BP 118/79 | HR 58 | Temp 97.8°F | Ht 66.0 in | Wt 138.0 lb

## 2023-11-04 DIAGNOSIS — K551 Chronic vascular disorders of intestine: Secondary | ICD-10-CM | POA: Diagnosis not present

## 2023-11-04 NOTE — Progress Notes (Signed)
 Patient ID: Meshilem Machuca, male   DOB: Dec 19, 1955, 68 y.o.   MRN: [id number]  Reason for Consult: Follow-up   Referred by Katrinka Garnette KIDD, MD  Subjective:     HPI:  Arch Methot is a 68 y.o. 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  He has an SMA aneurysm present since 2022.  This has always been asymptomatic.  He continues to do very well other than some increase stress secondary to diagnosis of cancer and his son.  He is not taking any antiplatelet medications and has previously decided against statin but currently considering.  He has no new back or abdominal pain and eats without limitation.  Mostly maintains a Mediterranean diet does eat meat occasionally.  He tells me that his recent hemoglobin A1c was 5.7 and that he has elevated cholesterol.  Past Medical History:  Diagnosis Date   ALLERGIC RHINITIS 09/25/2006   Anxiety On and off   BRADYCARDIA 09/25/2006   athletic Heart rate   CHEST PAIN UNSPECIFIED 10/28/2007   cardiology evaluation including stress test reassuring   Depression On and off   GERD (gastroesophageal reflux disease) 5 years ago   INSOMNIA, CHRONIC 08/09/2008   PALPITATIONS, CHRONIC 10/28/2007   Family History  Problem Relation Age of Onset   Aortic aneurysm Mother        never smoker   Breast cancer Mother        stage I- mastectomy   Cervical cancer Mother        uterine actually   Lung cancer Father        smoker. chemo, radiation. surgery not available- spots shrunk some. depressed mood   Pulmonary fibrosis Father        passed at 59   Cancer Father    Scoliosis Sister        also some head trauma after abuse from spouse   Other Maternal Grandmother        unilateral kidney but lived to 60   Heart attack Paternal Grandfather        younger than 36- but patients dad has not had issues   Other Son        X-linked gammaglobulinemia. monthly infusions   Healthy Son    Colon cancer Neg Hx    Colon polyps Neg Hx     Esophageal cancer Neg Hx    Rectal cancer Neg Hx    Stomach cancer Neg Hx    Past Surgical History:  Procedure Laterality Date   COLONOSCOPY  11/09/2008   Stark-normal   COLONOSCOPY  2022   Stark-normal GERD   left cataract surgery Left    sept 2024   NASAL SEPTUM SURGERY     UPPER GASTROINTESTINAL ENDOSCOPY      Short Social History:  Social History   Tobacco Use   Smoking status: Never   Smokeless tobacco: Never  Substance Use Topics   Alcohol use: Yes    Alcohol/week: 7.0 standard drinks of alcohol    Types: 7 Standard drinks or equivalent per week    Allergies  Allergen Reactions   Meperidine Other (See Comments)    Decreased BP  REACTION: decreased BP, Decreased BP   Protonix  [Pantoprazole ] Other (See Comments)    Headache    Current Outpatient Medications  Medication Sig Dispense Refill   Cyanocobalamin  (VITAMIN B-12 PO) Take 1 tablet by mouth 2 (two) times a week.     MAGNESIUM GLUCONATE PO Take 200 mg by mouth daily.  Misc Natural Products (TART CHERRY ADVANCED PO) Take 1 tablet by mouth daily.     Omega-3 Fatty Acids (FISH OIL PO) Take 2 capsules by mouth daily in the afternoon.     omeprazole  (PRILOSEC OTC) 20 MG tablet Take 40 mg by mouth daily.     rosuvastatin  (CRESTOR ) 5 MG tablet Take 1 tablet (5 mg total) by mouth daily. 90 tablet 3   No current facility-administered medications for this visit.    Review of Systems  Constitutional:  Constitutional negative. HENT: HENT negative.  Eyes: Eyes negative.  Respiratory: Respiratory negative.  Cardiovascular: Cardiovascular negative.  GI: Gastrointestinal negative.  Musculoskeletal: Musculoskeletal negative.  Skin: Skin negative.  Neurological: Neurological negative. Hematologic: Hematologic/lymphatic negative.  Psychiatric: Psychiatric negative.        Objective:  Objective   Vitals:   11/04/23 0826  BP: 118/79  Pulse: (!) 58  Temp: 97.8 F (36.6 C)  SpO2: 97%  Weight: 138 lb  (62.6 kg)  Height: 5' 6 (1.676 m)   Body mass index is 22.27 kg/m.  Physical Exam HENT:     Head: Normocephalic.     Nose: Nose normal.  Eyes:     Pupils: Pupils are equal, round, and reactive to light.  Cardiovascular:     Rate and Rhythm: Normal rate.     Pulses:          Popliteal pulses are 3+ on the right side.  Pulmonary:     Effort: Pulmonary effort is normal.  Abdominal:     General: Abdomen is flat.  Musculoskeletal:        General: Normal range of motion.     Cervical back: Normal range of motion.     Right lower leg: No edema.     Left lower leg: No edema.  Skin:    General: Skin is warm.     Capillary Refill: Capillary refill takes less than 2 seconds.  Neurological:     General: No focal deficit present.     Mental Status: He is alert.  Psychiatric:        Mood and Affect: Mood normal.     Data: Duplex Findings:  +----------------------+--------+--------+------+--------------------------  ----+  Mesenteric           PSV cm/sEDV cm/sPlaque           Comments              +----------------------+--------+--------+------+--------------------------  ----+  Aorta Prox               79                                                  +----------------------+--------+--------+------+--------------------------  ----+  Aorta Mid                62                                                  +----------------------+--------+--------+------+--------------------------  ----+  Celiac Artery Origin    119                                                  +----------------------+--------+--------+------+--------------------------  ----+  Celiac Artery Proximal   79      20                                          +----------------------+--------+--------+------+--------------------------  ----+  SMA Origin               98      14                                           +----------------------+--------+--------+------+--------------------------  ----+  SMA Proximal            154                     1.3 x .9 cm partially                                                         thrombosed aneurysm.                                                      Technically challenging  .     +----------------------+--------+--------+------+--------------------------  ----+  CHA                    117      32                                          +----------------------+--------+--------+------+--------------------------  ----+  Splenic                                      not seen due to  overlying                                                             bowel                +----------------------+--------+--------+------+--------------------------  ----+  IMA                     76                                                  +----------------------+--------+--------+------+--------------------------  ----+     Mesenteric Technologist observations:           Summary:  Largest Aortic Diameter: 2.2 cm    Mesenteric:  Normal Celiac artery , Inferior Mesenteric artery and Hepatic artery  findings.  1.3 x .9 cm SMA partially thrombosed aneurysm. There is no evidence of  stenosis  seen in SMA. Splenic artery not seen due to overlying bowel.         Assessment/Plan:     68 year old male with SMA aneurysm as noted above.  We again discussed the SVS guidelines for repair of all SMA aneurysms however this has likely been present for several years and appears unchanged by duplex which had very good ultrasound images today.  We again discussed the difficulty repairing his aneurysm given the location next to very large branches with risk of covering the branches versus risk of distal embolization.  We also discussed beginning aspirin therapy daily.  He continues to follow with cardiology for thoracic  aneurysm and I will have him follow-up here in 1 year with repeat mesenteric duplex and we will also obtain follow-up to artery duplexes given his history of aneurysm disease as well as enlarged right popliteal artery pulse on today's exam.  All questions were answered he demonstrates good understanding     Penne Lonni Colorado MD [facility] [place] [facility]

## 2023-11-06 ENCOUNTER — Encounter: Payer: Self-pay | Admitting: Vascular Surgery

## 2023-12-16 DIAGNOSIS — I772 Rupture of artery: Secondary | ICD-10-CM | POA: Diagnosis not present

## 2023-12-16 DIAGNOSIS — I7121 Aneurysm of the ascending aorta, without rupture: Secondary | ICD-10-CM | POA: Diagnosis not present

## 2023-12-16 DIAGNOSIS — Q2733 Arteriovenous malformation of digestive system vessel: Secondary | ICD-10-CM | POA: Diagnosis not present

## 2023-12-21 ENCOUNTER — Encounter: Payer: Medicare HMO | Admitting: Family Medicine

## 2023-12-22 ENCOUNTER — Encounter: Payer: Medicare HMO | Admitting: Family Medicine

## 2023-12-24 ENCOUNTER — Encounter: Admitting: Family Medicine

## 2023-12-29 ENCOUNTER — Encounter: Payer: Medicare HMO | Admitting: Family Medicine

## 2024-01-11 ENCOUNTER — Other Ambulatory Visit: Payer: Self-pay

## 2024-01-18 ENCOUNTER — Other Ambulatory Visit: Payer: Self-pay

## 2024-01-18 DIAGNOSIS — I700 Atherosclerosis of aorta: Secondary | ICD-10-CM

## 2024-01-19 DIAGNOSIS — I081 Rheumatic disorders of both mitral and tricuspid valves: Secondary | ICD-10-CM | POA: Diagnosis not present

## 2024-01-19 DIAGNOSIS — I7121 Aneurysm of the ascending aorta, without rupture: Secondary | ICD-10-CM | POA: Diagnosis not present

## 2024-02-17 ENCOUNTER — Ambulatory Visit (HOSPITAL_COMMUNITY)
Admission: RE | Admit: 2024-02-17 | Discharge: 2024-02-17 | Disposition: A | Discharge status: Home / Self Care | Source: Ambulatory Visit | Attending: Vascular Surgery | Admitting: Vascular Surgery

## 2024-02-17 ENCOUNTER — Ambulatory Visit (HOSPITAL_BASED_OUTPATIENT_CLINIC_OR_DEPARTMENT_OTHER)
Admission: RE | Admit: 2024-02-17 | Discharge: 2024-02-17 | Disposition: A | Discharge status: Home / Self Care | Source: Ambulatory Visit | Attending: Vascular Surgery | Admitting: Vascular Surgery

## 2024-02-17 ENCOUNTER — Ambulatory Visit: Admitting: Physician Assistant

## 2024-02-17 VITALS — BP 113/79 | HR 65 | Temp 97.7°F | Wt 140.4 lb

## 2024-02-17 DIAGNOSIS — K551 Chronic vascular disorders of intestine: Secondary | ICD-10-CM | POA: Diagnosis present

## 2024-02-17 DIAGNOSIS — I700 Atherosclerosis of aorta: Secondary | ICD-10-CM | POA: Insufficient documentation

## 2024-02-17 LAB — VAS US ABI WITH/WO TBI
Left ABI: 1.25
Right ABI: 1.17

## 2024-02-17 NOTE — Progress Notes (Signed)
 " Office Note     CC:  follow up Requesting Provider:  Katrinka Garnette KIDD, MD  HPI: Jay Combs is a 69 y.o. (08/07/1955) male who presents for follow-up.  He was seen by Dr. Sheree in September due to finding of SMA aneurysm.  He denies any new or changing abdominal or back pain.  He also denies any postprandial pain.  He has not had his lower extremities checked for popliteal aneurysms.  He denies any claudication symptoms, rest pain, or tissue loss of bilateral lower extremities.  He takes a daily aspirin.  He has plans to discuss low-dose statin with his PCP this week.  He is followed by CT surgery for an ascending aortic aneurysm.   Past Medical History:  Diagnosis Date   ALLERGIC RHINITIS 09/25/2006   Anxiety On and off   BRADYCARDIA 09/25/2006   athletic Heart rate   CHEST PAIN UNSPECIFIED 10/28/2007   cardiology evaluation including stress test reassuring   Depression On and off   GERD (gastroesophageal reflux disease) 5 years ago   INSOMNIA, CHRONIC 08/09/2008   PALPITATIONS, CHRONIC 10/28/2007    Past Surgical History:  Procedure Laterality Date   COLONOSCOPY  11/09/2008   Stark-normal   COLONOSCOPY  2022   Stark-normal GERD   left cataract surgery Left    sept 2024   NASAL SEPTUM SURGERY     UPPER GASTROINTESTINAL ENDOSCOPY      Social History   Socioeconomic History   Marital status: Married    Spouse name: Not on file   Number of children: 2   Years of education: Not on file   Highest education level: Master's degree (e.g., MA, MS, MEng, MEd, MSW, MBA)  Occupational History   Occupation: Administrator, Civil Service: [employer]  Tobacco Use   Smoking status: Never   Smokeless tobacco: Never  Vaping Use   Vaping status: Never Used  Substance and Sexual Activity   Alcohol use: Yes    Alcohol/week: 7.0 standard drinks of alcohol    Types: 7 Standard drinks or equivalent per week   Drug use: No   Sexual activity: Not on file  Other Topics Concern    Not on file  Social History Narrative   Married. 2 sons 30 and 27 in 2018. No grandkids yet. Has some grandcats. 1 son with colon cancer in January 2025.         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 Started with a friend- they bought together. January 2020.    IT TRAINER, masters   In past and would do again-teaching part time accouting professor at [employer].        Hobbies: reading, time with family, not a big sports person   Social Drivers of Health   Tobacco Use: Low Risk (02/17/2024)   Patient History    Smoking Tobacco Use: Never    Smokeless Tobacco Use: Never    Passive Exposure: Not on file  Financial Resource Strain: Low Risk (05/21/2023)   Overall Financial Resource Strain (CARDIA)    Difficulty of Paying Living Expenses: Not hard at all  Food Insecurity: No Food Insecurity (05/21/2023)   Hunger Vital Sign    Worried About Running Out of Food in the Last Year: Never true    Ran Out of Food in the Last Year: Never true  Transportation Needs: No Transportation Needs (05/21/2023)   PRAPARE - Administrator, Civil Service (Medical): No  Lack of Transportation (Non-Medical): No  Physical Activity: Insufficiently Active (05/21/2023)   Exercise Vital Sign    Days of Exercise per Week: 4 days    Minutes of Exercise per Session: 30 min  Stress: Stress Concern Present (05/21/2023)   [employer] of Occupational Health - Occupational Stress Questionnaire    Feeling of Stress : To some extent  Social Connections: Socially Integrated (05/21/2023)   Social Connection and Isolation Panel    Frequency of Communication with Friends and Family: Twice a week    Frequency of Social Gatherings with Friends and Family: Once a week    Attends Religious Services: More than 4 times per year    Active Member of Clubs or Organizations: Yes    Attends Banker Meetings: More than 4 times per year    Marital Status: Married  Catering Manager Violence: Not  on file  Depression (PHQ2-9): Low Risk (08/25/2023)   Depression (PHQ2-9)    PHQ-2 Score: 0  Alcohol Screen: Low Risk (05/21/2023)   Alcohol Screen    Last Alcohol Screening Score (AUDIT): 3  Housing: Unknown (05/21/2023)   Housing Stability Vital Sign    Unable to Pay for Housing in the Last Year: Not on file    Number of Times Moved in the Last Year: Not on file    Homeless in the Last Year: No  Utilities: Low Risk (06/27/2022)   Received from [facility]   Utilities    In the past 12 months has the electric, gas, oil, or water company threatened to shut off services in your home? : No  Health Literacy: Not on file    Family History  Problem Relation Age of Onset   Aortic aneurysm Mother        never smoker   Breast cancer Mother        stage I- mastectomy   Cervical cancer Mother        uterine actually   Lung cancer Father        smoker. chemo, radiation. surgery not available- spots shrunk some. depressed mood   Pulmonary fibrosis Father        passed at 51   Cancer Father    Scoliosis Sister        also some head trauma after abuse from spouse   Other Maternal Grandmother        unilateral kidney but lived to 47   Heart attack Paternal Grandfather        younger than 37- but patients dad has not had issues   Other Son        X-linked gammaglobulinemia. monthly infusions   Healthy Son    Colon cancer Neg Hx    Colon polyps Neg Hx    Esophageal cancer Neg Hx    Rectal cancer Neg Hx    Stomach cancer Neg Hx     Current Outpatient Medications  Medication Sig Dispense Refill   Cyanocobalamin  (VITAMIN B-12 PO) Take 1 tablet by mouth 2 (two) times a week.     MAGNESIUM GLUCONATE PO Take 200 mg by mouth daily.     Misc Natural Products (TART CHERRY ADVANCED PO) Take 1 tablet by mouth daily.     Omega-3 Fatty Acids (FISH OIL PO) Take 2 capsules by mouth daily in the afternoon.     omeprazole  (PRILOSEC OTC) 20 MG tablet Take 40 mg by mouth daily.     rosuvastatin   (CRESTOR ) 5 MG tablet Take 1 tablet (5  mg total) by mouth daily. (Patient not taking: Reported on 02/17/2024) 90 tablet 3   No current facility-administered medications for this visit.    Allergies[1]   REVIEW OF SYSTEMS:  Negative unless noted in HPI [X]  denotes positive finding, [ ]  denotes negative finding Cardiac  Comments:  Chest pain or chest pressure:    Shortness of breath upon exertion:    Short of breath when lying flat:    Irregular heart rhythm:        Vascular    Pain in calf, thigh, or hip brought on by ambulation:    Pain in feet at night that wakes you up from your sleep:     Blood clot in your veins:    Leg swelling:         Pulmonary    Oxygen at home:    Productive cough:     Wheezing:         Neurologic    Sudden weakness in arms or legs:     Sudden numbness in arms or legs:     Sudden onset of difficulty speaking or slurred speech:    Temporary loss of vision in one eye:     Problems with dizziness:         Gastrointestinal    Blood in stool:     Vomited blood:         Genitourinary    Burning when urinating:     Blood in urine:        Psychiatric    Major depression:         Hematologic    Bleeding problems:    Problems with blood clotting too easily:        Skin    Rashes or ulcers:        Constitutional    Fever or chills:      PHYSICAL EXAMINATION:  Vitals:   02/17/24 1440  BP: 113/79  Pulse: 65  Temp: 97.7 F (36.5 C)  TempSrc: Temporal  Weight: 140 lb 6.4 oz (63.7 kg)    General:  WDWN in NAD; vital signs documented above Gait: Not observed HENT: WNL, normocephalic Pulmonary: normal non-labored breathing Cardiac: regular HR Abdomen: soft, NT, no masses Skin: without rashes Vascular Exam/Pulses: palpable PT pulses BLE Extremities: without ischemic changes, without Gangrene , without cellulitis; without open wounds;  Musculoskeletal: no muscle wasting or atrophy  Neurologic: A&O X 3 Psychiatric:  The pt has Normal  affect.   Non-Invasive Vascular Imaging:   Bilateral lower extremity duplex negative for popliteal aneurysms  ABI/TBIToday's ABIToday's TBIPrevious ABIPrevious TBI  +-------+-----------+-----------+------------+------------+  Right 1.17       0.80                                 +-------+-----------+-----------+------------+------------+  Left  1.25       0.53                                 +-------+-----------+-----------+------------+-----     ASSESSMENT/PLAN:: 69 y.o. male here for follow up for evaluation of bilateral lower extremities  Mr. Coufal is a 69 year old male followed by Dr. Sheree for SMA aneurysm.  He returns to clinic for evaluation of bilateral lower extremities.  Duplex was negative for popliteal aneurysms bilaterally.  He also has normal ABIs with triphasic waveforms at the ankle.  He  will follow-up as scheduled for repeat mesenteric duplex with Dr. Sheree this fall.  He will continue his aspirin daily.  Encouraged him to resume a low-dose statin however he will discuss this with his PCP later this week   Donnice Sender, PA-C [facility] [phone]  Clinic MD:   Sheree     [1]  Allergies Allergen Reactions   Meperidine Other (See Comments)    Decreased BP  REACTION: decreased BP, Decreased BP   Protonix  [Pantoprazole ] Other (See Comments)    Headache   "

## 2024-02-22 ENCOUNTER — Ambulatory Visit (INDEPENDENT_AMBULATORY_CARE_PROVIDER_SITE_OTHER): Admitting: Family Medicine

## 2024-02-22 ENCOUNTER — Encounter: Payer: Self-pay | Admitting: Family Medicine

## 2024-02-22 VITALS — BP 112/68 | HR 62 | Temp 98.1°F | Ht 66.0 in | Wt 137.2 lb

## 2024-02-22 DIAGNOSIS — E785 Hyperlipidemia, unspecified: Secondary | ICD-10-CM | POA: Diagnosis not present

## 2024-02-22 DIAGNOSIS — R1031 Right lower quadrant pain: Secondary | ICD-10-CM | POA: Diagnosis not present

## 2024-02-22 MED ORDER — ROSUVASTATIN CALCIUM 5 MG PO TABS: 5.0000 mg | ORAL_TABLET | Freq: Every day | ORAL | 3 refills | Status: AC

## 2024-02-22 NOTE — Progress Notes (Signed)
 " Phone [phone] In person visit   Subjective:   Jay Combs is a 69 y.o. year old very pleasant male patient who presents for/with See problem oriented charting Chief Complaint  Patient presents with   Groin Pain    X2 months; no issues with urination; on and off pain; dull ache;    Past Medical History-  Patient Active Problem List   Diagnosis Date Noted   Aortic atherosclerosis 04/15/2021    Priority: High   Thoracic aortic ectasia 08/20/2020    Priority: High   Vitamin D  deficiency 09/26/2021    Priority: Medium    Lumbar radiculopathy 12/15/2018    Priority: Medium    Anxiety 08/12/2017    Priority: Medium    GERD (gastroesophageal reflux disease) 01/12/2017    Priority: Medium    Hyperlipidemia 01/12/2017    Priority: Medium    Ptosis, right 01/12/2017    Priority: Medium    Macular pucker of both eyes 01/12/2017    Priority: Low   PALPITATIONS, CHRONIC 10/28/2007    Priority: Low   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     MAGNESIUM GLUCONATE PO Take 200 mg by mouth daily.     Misc Natural Products (TART CHERRY ADVANCED PO) Take 1 tablet by mouth daily.     Omega-3 Fatty Acids (FISH OIL PO) Take 2 capsules by mouth daily in the afternoon.     omeprazole  (PRILOSEC OTC) 20 MG tablet Take 40 mg by mouth daily.     rosuvastatin  (CRESTOR ) 5 MG tablet Take 1 tablet (5 mg total) by mouth daily. 90 tablet 3   No current facility-administered medications for this visit.     Objective:  BP 112/68 (BP Location: Left Arm, Patient Position: Sitting, Cuff Size: Normal)   Pulse 62   Temp 98.1 F (36.7 C) (Temporal)   Ht 5' 6 (1.676 m)   Wt 137 lb 3.2 oz  (62.2 kg)   SpO2 99%   BMI 22.14 kg/m  Gen: NAD, resting comfortably CV: RRR no murmurs rubs or gallops Lungs: CTAB no crackles, wheeze, rhonchi Groin exam without hernia, testicular exam normal Ext: no edema Skin: warm, dry    Assessment and Plan   # Right Groin pain S: 1-2 months of pains. No pain with urination, frequency, penile discharge. Off and on- feels dull ache. No bulge. On palpation. Stable in course. Mild overall -thought could be gym or straining but no consistency -sometimes bothers him sleeping on side other time not. Does not bother him laying on back.  A/P: Mild right groin pain for 1 to 2 months-no lymphadenopathy or exam.  Unable to detect hernia.  Offered ultrasound but he would like to monitor this and if persistent in the next month get this completed-he Jay reach out  #hyperlipidemia S: Medication: None.  Prior rosuvastatin  prescribed but he has never taken this Lab Results  Component Value Date   CHOL 193 12/19/2022   HDL 69.30 12/19/2022   LDLCALC 113 (H) 12/19/2022   LDLDIRECT 117.7 11/19/2009   TRIG 52.0 12/19/2022   CHOLHDL 3 12/19/2022   A/P: Poorly controlled off of medicine-patient agrees at this point to trial medicine- the more he  thinks about SMA aneurysm. We Jay trial rosuvastatin  5 mg daily and we can recheck at his visit in April    Recommended follow up: Return for next already scheduled visit or sooner if needed. Future Appointments  Date Time Provider Department Center  06/08/2024  8:20 AM Jay Jay KIDD, MD [facility]   Lab/Order associations:   ICD-10-CM   1. Right groin pain  R10.31     2. Hyperlipidemia, unspecified hyperlipidemia type  E78.5       Meds ordered this encounter  Medications   rosuvastatin  (CRESTOR ) 5 MG tablet    Sig: Take 1 tablet (5 mg total) by mouth daily.    Dispense:  90 tablet    Refill:  3    Return precautions advised.  Jay Katrinka, MD  "

## 2024-02-22 NOTE — Patient Instructions (Addendum)
 I do not feel an obvious hernia on exam. No enlarged lymph nodes either. Since this is intermittent you wanted to monitor but we discussed if not doing any better in next month doing an ultrasound- you will reach out  Start rosuvastatin  5 mg- we can check labs at April visit  Recommended follow up: Return for next already scheduled visit or sooner if needed.

## 2024-06-08 ENCOUNTER — Encounter: Admitting: Family Medicine
